# Patient Record
Sex: Male | Born: 1935 | Race: White | Hispanic: No | Smoking: Never smoker
Health system: Southern US, Community
[De-identification: ages and names within clinical notes are randomized; demographics above are authoritative.]

## PROBLEM LIST (undated history)

## (undated) DIAGNOSIS — N189 Chronic kidney disease, unspecified: Secondary | ICD-10-CM

## (undated) DIAGNOSIS — F0280 Dementia in other diseases classified elsewhere without behavioral disturbance: Secondary | ICD-10-CM

## (undated) DIAGNOSIS — K819 Cholecystitis, unspecified: Secondary | ICD-10-CM

## (undated) DIAGNOSIS — D500 Iron deficiency anemia secondary to blood loss (chronic): Secondary | ICD-10-CM

## (undated) DIAGNOSIS — G309 Alzheimer's disease, unspecified: Secondary | ICD-10-CM

## (undated) DIAGNOSIS — R0600 Dyspnea, unspecified: Secondary | ICD-10-CM

## (undated) DIAGNOSIS — T148XXA Other injury of unspecified body region, initial encounter: Secondary | ICD-10-CM

## (undated) DIAGNOSIS — R0609 Other forms of dyspnea: Secondary | ICD-10-CM

## (undated) DIAGNOSIS — Z9289 Personal history of other medical treatment: Secondary | ICD-10-CM

## (undated) DIAGNOSIS — I4891 Unspecified atrial fibrillation: Secondary | ICD-10-CM

## (undated) DIAGNOSIS — F0390 Unspecified dementia without behavioral disturbance: Secondary | ICD-10-CM

## (undated) DIAGNOSIS — E039 Hypothyroidism, unspecified: Secondary | ICD-10-CM

## (undated) HISTORY — PX: TONSILLECTOMY: SUR1361

## (undated) HISTORY — PX: ABDOMINAL SURGERY: SHX537

## (undated) HISTORY — PX: PARTIAL GASTRECTOMY: SHX2172

---

## 2008-05-09 ENCOUNTER — Emergency Department (HOSPITAL_COMMUNITY): Admission: EM | Admit: 2008-05-09 | Discharge: 2008-05-09 | Payer: Self-pay | Admitting: Emergency Medicine

## 2008-12-06 ENCOUNTER — Encounter: Admission: RE | Admit: 2008-12-06 | Discharge: 2008-12-06 | Payer: Self-pay | Admitting: Internal Medicine

## 2010-06-14 LAB — DIFFERENTIAL
Basophils Absolute: 0 10*3/uL (ref 0.0–0.1)
Basophils Relative: 0 % (ref 0–1)
Eosinophils Absolute: 0.1 10*3/uL (ref 0.0–0.7)
Monocytes Absolute: 0.5 10*3/uL (ref 0.1–1.0)

## 2010-06-14 LAB — CBC
HCT: 40.4 % (ref 39.0–52.0)
MCHC: 34.2 g/dL (ref 30.0–36.0)
RBC: 4.48 MIL/uL (ref 4.22–5.81)
RDW: 14.4 % (ref 11.5–15.5)

## 2010-06-14 LAB — BASIC METABOLIC PANEL
BUN: 19 mg/dL (ref 6–23)
CO2: 26 mEq/L (ref 19–32)
Chloride: 106 mEq/L (ref 96–112)
Glucose, Bld: 129 mg/dL — ABNORMAL HIGH (ref 70–99)
Sodium: 139 mEq/L (ref 135–145)

## 2010-06-14 LAB — POCT CARDIAC MARKERS: Troponin i, poc: <0.05 ng/mL (ref 0.00–0.09)

## 2011-03-12 DIAGNOSIS — H903 Sensorineural hearing loss, bilateral: Secondary | ICD-10-CM | POA: Diagnosis not present

## 2011-03-21 DIAGNOSIS — Z7901 Long term (current) use of anticoagulants: Secondary | ICD-10-CM | POA: Diagnosis not present

## 2011-03-21 DIAGNOSIS — I4891 Unspecified atrial fibrillation: Secondary | ICD-10-CM | POA: Diagnosis not present

## 2011-05-08 DIAGNOSIS — I4891 Unspecified atrial fibrillation: Secondary | ICD-10-CM | POA: Diagnosis not present

## 2011-05-08 DIAGNOSIS — Z7901 Long term (current) use of anticoagulants: Secondary | ICD-10-CM | POA: Diagnosis not present

## 2011-06-20 DIAGNOSIS — Z7901 Long term (current) use of anticoagulants: Secondary | ICD-10-CM | POA: Diagnosis not present

## 2011-06-20 DIAGNOSIS — I4891 Unspecified atrial fibrillation: Secondary | ICD-10-CM | POA: Diagnosis not present

## 2011-06-21 DIAGNOSIS — C44320 Squamous cell carcinoma of skin of unspecified parts of face: Secondary | ICD-10-CM | POA: Diagnosis not present

## 2011-07-01 DIAGNOSIS — C44320 Squamous cell carcinoma of skin of unspecified parts of face: Secondary | ICD-10-CM | POA: Diagnosis not present

## 2011-07-15 DIAGNOSIS — N400 Enlarged prostate without lower urinary tract symptoms: Secondary | ICD-10-CM | POA: Diagnosis not present

## 2011-07-15 DIAGNOSIS — N529 Male erectile dysfunction, unspecified: Secondary | ICD-10-CM | POA: Diagnosis not present

## 2011-07-15 DIAGNOSIS — N419 Inflammatory disease of prostate, unspecified: Secondary | ICD-10-CM | POA: Diagnosis not present

## 2011-08-08 DIAGNOSIS — I4891 Unspecified atrial fibrillation: Secondary | ICD-10-CM | POA: Diagnosis not present

## 2011-08-08 DIAGNOSIS — Z7901 Long term (current) use of anticoagulants: Secondary | ICD-10-CM | POA: Diagnosis not present

## 2011-10-03 DIAGNOSIS — Z7901 Long term (current) use of anticoagulants: Secondary | ICD-10-CM | POA: Diagnosis not present

## 2011-12-02 DIAGNOSIS — I4891 Unspecified atrial fibrillation: Secondary | ICD-10-CM | POA: Diagnosis not present

## 2011-12-02 DIAGNOSIS — Z79899 Other long term (current) drug therapy: Secondary | ICD-10-CM | POA: Diagnosis not present

## 2011-12-02 DIAGNOSIS — Z125 Encounter for screening for malignant neoplasm of prostate: Secondary | ICD-10-CM | POA: Diagnosis not present

## 2011-12-09 DIAGNOSIS — M1990 Unspecified osteoarthritis, unspecified site: Secondary | ICD-10-CM | POA: Diagnosis not present

## 2011-12-09 DIAGNOSIS — Z79899 Other long term (current) drug therapy: Secondary | ICD-10-CM | POA: Diagnosis not present

## 2011-12-09 DIAGNOSIS — Z23 Encounter for immunization: Secondary | ICD-10-CM | POA: Diagnosis not present

## 2011-12-09 DIAGNOSIS — Z0000 Encounter for general adult medical examination without abnormal findings: Secondary | ICD-10-CM | POA: Diagnosis not present

## 2011-12-09 DIAGNOSIS — Z1331 Encounter for screening for depression: Secondary | ICD-10-CM | POA: Diagnosis not present

## 2011-12-09 DIAGNOSIS — I4891 Unspecified atrial fibrillation: Secondary | ICD-10-CM | POA: Diagnosis not present

## 2011-12-10 DIAGNOSIS — Z1212 Encounter for screening for malignant neoplasm of rectum: Secondary | ICD-10-CM | POA: Diagnosis not present

## 2012-01-07 DIAGNOSIS — L738 Other specified follicular disorders: Secondary | ICD-10-CM | POA: Diagnosis not present

## 2012-01-07 DIAGNOSIS — Z85828 Personal history of other malignant neoplasm of skin: Secondary | ICD-10-CM | POA: Diagnosis not present

## 2012-01-07 DIAGNOSIS — L570 Actinic keratosis: Secondary | ICD-10-CM | POA: Diagnosis not present

## 2012-01-07 DIAGNOSIS — Z7901 Long term (current) use of anticoagulants: Secondary | ICD-10-CM | POA: Diagnosis not present

## 2012-01-07 DIAGNOSIS — L821 Other seborrheic keratosis: Secondary | ICD-10-CM | POA: Diagnosis not present

## 2012-01-27 DIAGNOSIS — R351 Nocturia: Secondary | ICD-10-CM | POA: Diagnosis not present

## 2012-01-27 DIAGNOSIS — N400 Enlarged prostate without lower urinary tract symptoms: Secondary | ICD-10-CM | POA: Diagnosis not present

## 2012-01-27 DIAGNOSIS — N529 Male erectile dysfunction, unspecified: Secondary | ICD-10-CM | POA: Diagnosis not present

## 2012-02-11 DIAGNOSIS — I4891 Unspecified atrial fibrillation: Secondary | ICD-10-CM | POA: Diagnosis not present

## 2012-02-11 DIAGNOSIS — Z7901 Long term (current) use of anticoagulants: Secondary | ICD-10-CM | POA: Diagnosis not present

## 2012-02-20 DIAGNOSIS — L570 Actinic keratosis: Secondary | ICD-10-CM | POA: Diagnosis not present

## 2012-02-20 DIAGNOSIS — L821 Other seborrheic keratosis: Secondary | ICD-10-CM | POA: Diagnosis not present

## 2012-02-20 DIAGNOSIS — Z85828 Personal history of other malignant neoplasm of skin: Secondary | ICD-10-CM | POA: Diagnosis not present

## 2012-03-24 DIAGNOSIS — I4891 Unspecified atrial fibrillation: Secondary | ICD-10-CM | POA: Diagnosis not present

## 2012-03-24 DIAGNOSIS — Z7901 Long term (current) use of anticoagulants: Secondary | ICD-10-CM | POA: Diagnosis not present

## 2012-05-03 DIAGNOSIS — R404 Transient alteration of awareness: Secondary | ICD-10-CM | POA: Diagnosis not present

## 2012-05-03 DIAGNOSIS — R42 Dizziness and giddiness: Secondary | ICD-10-CM | POA: Diagnosis not present

## 2012-05-05 DIAGNOSIS — Z7901 Long term (current) use of anticoagulants: Secondary | ICD-10-CM | POA: Diagnosis not present

## 2012-05-05 DIAGNOSIS — I4891 Unspecified atrial fibrillation: Secondary | ICD-10-CM | POA: Diagnosis not present

## 2012-06-08 DIAGNOSIS — I4891 Unspecified atrial fibrillation: Secondary | ICD-10-CM | POA: Diagnosis not present

## 2012-06-08 DIAGNOSIS — M1990 Unspecified osteoarthritis, unspecified site: Secondary | ICD-10-CM | POA: Diagnosis not present

## 2012-06-08 DIAGNOSIS — Z7901 Long term (current) use of anticoagulants: Secondary | ICD-10-CM | POA: Diagnosis not present

## 2012-07-08 DIAGNOSIS — Z7901 Long term (current) use of anticoagulants: Secondary | ICD-10-CM | POA: Diagnosis not present

## 2012-07-08 DIAGNOSIS — Z79899 Other long term (current) drug therapy: Secondary | ICD-10-CM | POA: Diagnosis not present

## 2012-07-08 DIAGNOSIS — I4891 Unspecified atrial fibrillation: Secondary | ICD-10-CM | POA: Diagnosis not present

## 2012-07-23 DIAGNOSIS — L570 Actinic keratosis: Secondary | ICD-10-CM | POA: Diagnosis not present

## 2012-07-23 DIAGNOSIS — Z85828 Personal history of other malignant neoplasm of skin: Secondary | ICD-10-CM | POA: Diagnosis not present

## 2012-07-23 DIAGNOSIS — C44320 Squamous cell carcinoma of skin of unspecified parts of face: Secondary | ICD-10-CM | POA: Diagnosis not present

## 2012-07-23 DIAGNOSIS — L821 Other seborrheic keratosis: Secondary | ICD-10-CM | POA: Diagnosis not present

## 2012-09-28 DIAGNOSIS — L570 Actinic keratosis: Secondary | ICD-10-CM | POA: Diagnosis not present

## 2012-09-28 DIAGNOSIS — L821 Other seborrheic keratosis: Secondary | ICD-10-CM | POA: Diagnosis not present

## 2012-09-28 DIAGNOSIS — Z85828 Personal history of other malignant neoplasm of skin: Secondary | ICD-10-CM | POA: Diagnosis not present

## 2012-09-28 DIAGNOSIS — L738 Other specified follicular disorders: Secondary | ICD-10-CM | POA: Diagnosis not present

## 2012-10-29 DIAGNOSIS — Z7901 Long term (current) use of anticoagulants: Secondary | ICD-10-CM | POA: Diagnosis not present

## 2012-10-29 DIAGNOSIS — I4891 Unspecified atrial fibrillation: Secondary | ICD-10-CM | POA: Diagnosis not present

## 2012-11-11 DIAGNOSIS — Z23 Encounter for immunization: Secondary | ICD-10-CM | POA: Diagnosis not present

## 2012-12-07 DIAGNOSIS — Z125 Encounter for screening for malignant neoplasm of prostate: Secondary | ICD-10-CM | POA: Diagnosis not present

## 2012-12-07 DIAGNOSIS — I4891 Unspecified atrial fibrillation: Secondary | ICD-10-CM | POA: Diagnosis not present

## 2012-12-07 DIAGNOSIS — Z79899 Other long term (current) drug therapy: Secondary | ICD-10-CM | POA: Diagnosis not present

## 2012-12-09 DIAGNOSIS — Z7901 Long term (current) use of anticoagulants: Secondary | ICD-10-CM | POA: Diagnosis not present

## 2012-12-09 DIAGNOSIS — I4891 Unspecified atrial fibrillation: Secondary | ICD-10-CM | POA: Diagnosis not present

## 2012-12-16 DIAGNOSIS — IMO0002 Reserved for concepts with insufficient information to code with codable children: Secondary | ICD-10-CM | POA: Diagnosis not present

## 2012-12-16 DIAGNOSIS — M1990 Unspecified osteoarthritis, unspecified site: Secondary | ICD-10-CM | POA: Diagnosis not present

## 2012-12-16 DIAGNOSIS — Z7901 Long term (current) use of anticoagulants: Secondary | ICD-10-CM | POA: Diagnosis not present

## 2012-12-16 DIAGNOSIS — Z0000 Encounter for general adult medical examination without abnormal findings: Secondary | ICD-10-CM | POA: Diagnosis not present

## 2012-12-16 DIAGNOSIS — I4891 Unspecified atrial fibrillation: Secondary | ICD-10-CM | POA: Diagnosis not present

## 2012-12-16 DIAGNOSIS — Z1331 Encounter for screening for depression: Secondary | ICD-10-CM | POA: Diagnosis not present

## 2012-12-16 DIAGNOSIS — Z125 Encounter for screening for malignant neoplasm of prostate: Secondary | ICD-10-CM | POA: Diagnosis not present

## 2012-12-21 DIAGNOSIS — Z1212 Encounter for screening for malignant neoplasm of rectum: Secondary | ICD-10-CM | POA: Diagnosis not present

## 2012-12-23 DIAGNOSIS — M79609 Pain in unspecified limb: Secondary | ICD-10-CM | POA: Diagnosis not present

## 2012-12-23 DIAGNOSIS — B351 Tinea unguium: Secondary | ICD-10-CM | POA: Diagnosis not present

## 2012-12-23 DIAGNOSIS — I739 Peripheral vascular disease, unspecified: Secondary | ICD-10-CM | POA: Diagnosis not present

## 2012-12-23 DIAGNOSIS — L608 Other nail disorders: Secondary | ICD-10-CM | POA: Diagnosis not present

## 2013-01-12 DIAGNOSIS — R195 Other fecal abnormalities: Secondary | ICD-10-CM | POA: Diagnosis not present

## 2013-01-20 DIAGNOSIS — I4891 Unspecified atrial fibrillation: Secondary | ICD-10-CM | POA: Diagnosis not present

## 2013-01-20 DIAGNOSIS — Z7901 Long term (current) use of anticoagulants: Secondary | ICD-10-CM | POA: Diagnosis not present

## 2013-01-25 DIAGNOSIS — R351 Nocturia: Secondary | ICD-10-CM | POA: Diagnosis not present

## 2013-01-25 DIAGNOSIS — N400 Enlarged prostate without lower urinary tract symptoms: Secondary | ICD-10-CM | POA: Diagnosis not present

## 2013-01-25 DIAGNOSIS — N529 Male erectile dysfunction, unspecified: Secondary | ICD-10-CM | POA: Diagnosis not present

## 2013-01-25 DIAGNOSIS — R195 Other fecal abnormalities: Secondary | ICD-10-CM | POA: Diagnosis not present

## 2013-02-22 DIAGNOSIS — L608 Other nail disorders: Secondary | ICD-10-CM | POA: Diagnosis not present

## 2013-02-22 DIAGNOSIS — M79609 Pain in unspecified limb: Secondary | ICD-10-CM | POA: Diagnosis not present

## 2013-02-22 DIAGNOSIS — B351 Tinea unguium: Secondary | ICD-10-CM | POA: Diagnosis not present

## 2013-02-22 DIAGNOSIS — I739 Peripheral vascular disease, unspecified: Secondary | ICD-10-CM | POA: Diagnosis not present

## 2013-03-09 DIAGNOSIS — I4891 Unspecified atrial fibrillation: Secondary | ICD-10-CM | POA: Diagnosis not present

## 2013-03-09 DIAGNOSIS — Z7901 Long term (current) use of anticoagulants: Secondary | ICD-10-CM | POA: Diagnosis not present

## 2013-03-31 DIAGNOSIS — L570 Actinic keratosis: Secondary | ICD-10-CM | POA: Diagnosis not present

## 2013-03-31 DIAGNOSIS — Z85828 Personal history of other malignant neoplasm of skin: Secondary | ICD-10-CM | POA: Diagnosis not present

## 2013-03-31 DIAGNOSIS — L821 Other seborrheic keratosis: Secondary | ICD-10-CM | POA: Diagnosis not present

## 2013-04-24 ENCOUNTER — Encounter (HOSPITAL_COMMUNITY): Payer: Self-pay | Admitting: Emergency Medicine

## 2013-04-24 ENCOUNTER — Emergency Department (HOSPITAL_COMMUNITY): Payer: Medicare Other

## 2013-04-24 ENCOUNTER — Inpatient Hospital Stay (HOSPITAL_COMMUNITY)
Admission: EM | Admit: 2013-04-24 | Discharge: 2013-05-02 | DRG: 417 | Disposition: A | Discharge status: Skilled Nursing Facility | Payer: Medicare Other | Attending: Internal Medicine | Admitting: Internal Medicine

## 2013-04-24 DIAGNOSIS — G929 Unspecified toxic encephalopathy: Secondary | ICD-10-CM | POA: Diagnosis not present

## 2013-04-24 DIAGNOSIS — R0609 Other forms of dyspnea: Secondary | ICD-10-CM | POA: Diagnosis not present

## 2013-04-24 DIAGNOSIS — I4891 Unspecified atrial fibrillation: Secondary | ICD-10-CM | POA: Diagnosis present

## 2013-04-24 DIAGNOSIS — G92 Toxic encephalopathy: Secondary | ICD-10-CM | POA: Diagnosis not present

## 2013-04-24 DIAGNOSIS — R5381 Other malaise: Secondary | ICD-10-CM | POA: Diagnosis present

## 2013-04-24 DIAGNOSIS — R404 Transient alteration of awareness: Secondary | ICD-10-CM | POA: Diagnosis present

## 2013-04-24 DIAGNOSIS — F17200 Nicotine dependence, unspecified, uncomplicated: Secondary | ICD-10-CM | POA: Diagnosis present

## 2013-04-24 DIAGNOSIS — R110 Nausea: Secondary | ICD-10-CM | POA: Diagnosis not present

## 2013-04-24 DIAGNOSIS — N189 Chronic kidney disease, unspecified: Secondary | ICD-10-CM | POA: Diagnosis not present

## 2013-04-24 DIAGNOSIS — N281 Cyst of kidney, acquired: Secondary | ICD-10-CM | POA: Diagnosis not present

## 2013-04-24 DIAGNOSIS — M6281 Muscle weakness (generalized): Secondary | ICD-10-CM | POA: Diagnosis not present

## 2013-04-24 DIAGNOSIS — Z91040 Latex allergy status: Secondary | ICD-10-CM | POA: Diagnosis not present

## 2013-04-24 DIAGNOSIS — K810 Acute cholecystitis: Secondary | ICD-10-CM | POA: Diagnosis not present

## 2013-04-24 DIAGNOSIS — I4729 Other ventricular tachycardia: Secondary | ICD-10-CM | POA: Diagnosis present

## 2013-04-24 DIAGNOSIS — K819 Cholecystitis, unspecified: Secondary | ICD-10-CM | POA: Diagnosis present

## 2013-04-24 DIAGNOSIS — Z881 Allergy status to other antibiotic agents status: Secondary | ICD-10-CM | POA: Diagnosis not present

## 2013-04-24 DIAGNOSIS — K828 Other specified diseases of gallbladder: Secondary | ICD-10-CM | POA: Diagnosis not present

## 2013-04-24 DIAGNOSIS — G309 Alzheimer's disease, unspecified: Secondary | ICD-10-CM | POA: Diagnosis present

## 2013-04-24 DIAGNOSIS — R1013 Epigastric pain: Secondary | ICD-10-CM

## 2013-04-24 DIAGNOSIS — I472 Ventricular tachycardia, unspecified: Secondary | ICD-10-CM | POA: Diagnosis present

## 2013-04-24 DIAGNOSIS — F0280 Dementia in other diseases classified elsewhere without behavioral disturbance: Secondary | ICD-10-CM | POA: Diagnosis present

## 2013-04-24 DIAGNOSIS — Z7901 Long term (current) use of anticoagulants: Secondary | ICD-10-CM

## 2013-04-24 DIAGNOSIS — K8020 Calculus of gallbladder without cholecystitis without obstruction: Secondary | ICD-10-CM | POA: Diagnosis not present

## 2013-04-24 DIAGNOSIS — N183 Chronic kidney disease, stage 3 unspecified: Secondary | ICD-10-CM | POA: Diagnosis not present

## 2013-04-24 DIAGNOSIS — K7689 Other specified diseases of liver: Secondary | ICD-10-CM | POA: Diagnosis not present

## 2013-04-24 DIAGNOSIS — K660 Peritoneal adhesions (postprocedural) (postinfection): Secondary | ICD-10-CM | POA: Diagnosis present

## 2013-04-24 DIAGNOSIS — K8000 Calculus of gallbladder with acute cholecystitis without obstruction: Principal | ICD-10-CM | POA: Diagnosis present

## 2013-04-24 DIAGNOSIS — I359 Nonrheumatic aortic valve disorder, unspecified: Secondary | ICD-10-CM | POA: Diagnosis present

## 2013-04-24 DIAGNOSIS — N400 Enlarged prostate without lower urinary tract symptoms: Secondary | ICD-10-CM | POA: Diagnosis not present

## 2013-04-24 DIAGNOSIS — R0789 Other chest pain: Secondary | ICD-10-CM | POA: Diagnosis not present

## 2013-04-24 DIAGNOSIS — N1831 Chronic kidney disease, stage 3a: Secondary | ICD-10-CM | POA: Diagnosis present

## 2013-04-24 DIAGNOSIS — R262 Difficulty in walking, not elsewhere classified: Secondary | ICD-10-CM | POA: Diagnosis not present

## 2013-04-24 DIAGNOSIS — Z4889 Encounter for other specified surgical aftercare: Secondary | ICD-10-CM | POA: Diagnosis not present

## 2013-04-24 DIAGNOSIS — R0989 Other specified symptoms and signs involving the circulatory and respiratory systems: Secondary | ICD-10-CM | POA: Diagnosis not present

## 2013-04-24 DIAGNOSIS — R112 Nausea with vomiting, unspecified: Secondary | ICD-10-CM | POA: Diagnosis not present

## 2013-04-24 HISTORY — DX: Unspecified dementia, unspecified severity, without behavioral disturbance, psychotic disturbance, mood disturbance, and anxiety: F03.90

## 2013-04-24 HISTORY — DX: Chronic kidney disease, unspecified: N18.9

## 2013-04-24 HISTORY — DX: Unspecified atrial fibrillation: I48.91

## 2013-04-24 LAB — COMPREHENSIVE METABOLIC PANEL
ALT: 12 U/L (ref 0–53)
AST: 22 U/L (ref 0–37)
Albumin: 3.9 g/dL (ref 3.5–5.2)
Alkaline Phosphatase: 79 U/L (ref 39–117)
BUN: 21 mg/dL (ref 6–23)
CHLORIDE: 100 meq/L (ref 96–112)
CO2: 23 mEq/L (ref 19–32)
CREATININE: 1.57 mg/dL — AB (ref 0.50–1.35)
Calcium: 9.7 mg/dL (ref 8.4–10.5)
GFR calc Af Amer: 44 mL/min — ABNORMAL LOW (ref >90)
GFR, EST NON AFRICAN AMERICAN: 38 mL/min — AB (ref >90)
Glucose, Bld: 174 mg/dL — ABNORMAL HIGH (ref 70–99)
Potassium: 4.3 mEq/L (ref 3.7–5.3)
Sodium: 138 mEq/L (ref 137–147)
Total Bilirubin: 0.8 mg/dL (ref 0.3–1.2)
Total Protein: 7.8 g/dL (ref 6.0–8.3)

## 2013-04-24 LAB — URINALYSIS, ROUTINE W REFLEX MICROSCOPIC
Glucose, UA: NEGATIVE mg/dL
HGB URINE DIPSTICK: NEGATIVE
KETONES UR: 15 mg/dL — AB
Leukocytes, UA: NEGATIVE
Nitrite: NEGATIVE
PROTEIN: 30 mg/dL — AB
SPECIFIC GRAVITY, URINE: 1.029 (ref 1.005–1.030)
UROBILINOGEN UA: 1 mg/dL (ref 0.0–1.0)
pH: 5 (ref 5.0–8.0)

## 2013-04-24 LAB — I-STAT TROPONIN, ED: TROPONIN I, POC: 0.02 ng/mL (ref 0.00–0.08)

## 2013-04-24 LAB — CBC WITH DIFFERENTIAL/PLATELET
Basophils Absolute: 0 10*3/uL (ref 0.0–0.1)
Basophils Relative: 0 % (ref 0–1)
Eosinophils Absolute: 0 10*3/uL (ref 0.0–0.7)
Eosinophils Relative: 0 % (ref 0–5)
HCT: 47.2 % (ref 39.0–52.0)
Hemoglobin: 16.1 g/dL (ref 13.0–17.0)
Lymphocytes Relative: 6 % — ABNORMAL LOW (ref 12–46)
Lymphs Abs: 0.8 10*3/uL (ref 0.7–4.0)
MCH: 28.3 pg (ref 26.0–34.0)
MCHC: 34.1 g/dL (ref 30.0–36.0)
MCV: 83.1 fL (ref 78.0–100.0)
Monocytes Absolute: 1 10*3/uL (ref 0.1–1.0)
Monocytes Relative: 7 % (ref 3–12)
Neutro Abs: 12.3 10*3/uL — ABNORMAL HIGH (ref 1.7–7.7)
Neutrophils Relative %: 87 % — ABNORMAL HIGH (ref 43–77)
Platelets: 240 10*3/uL (ref 150–400)
RBC: 5.68 MIL/uL (ref 4.22–5.81)
RDW: 14.3 % (ref 11.5–15.5)
WBC: 14.2 10*3/uL — ABNORMAL HIGH (ref 4.0–10.5)

## 2013-04-24 LAB — URINE MICROSCOPIC-ADD ON

## 2013-04-24 LAB — PROTIME-INR
INR: 1.84 — ABNORMAL HIGH (ref 0.00–1.49)
Prothrombin Time: 20.7 seconds — ABNORMAL HIGH (ref 11.6–15.2)

## 2013-04-24 LAB — TROPONIN I: Troponin I: <0.3 ng/mL (ref <0.30)

## 2013-04-24 LAB — LIPASE, BLOOD: Lipase: 35 U/L (ref 11–59)

## 2013-04-24 MED ORDER — MORPHINE SULFATE 2 MG/ML IJ SOLN
2.0000 mg | Freq: Once | INTRAMUSCULAR | Status: AC
  Administered 2013-04-24: 2 mg via INTRAVENOUS
  Filled 2013-04-24: qty 1

## 2013-04-24 MED ORDER — SODIUM CHLORIDE 0.9 % IV BOLUS (SEPSIS)
1000.0000 mL | Freq: Once | INTRAVENOUS | Status: AC
  Administered 2013-04-24: 1000 mL via INTRAVENOUS

## 2013-04-24 MED ORDER — ONDANSETRON HCL 4 MG/2ML IJ SOLN: 4.0000 mg | Freq: Four times a day (QID) | INTRAMUSCULAR | Status: DC | PRN

## 2013-04-24 MED ORDER — ACETAMINOPHEN 650 MG RE SUPP: 650.0000 mg | Freq: Four times a day (QID) | RECTAL | Status: DC | PRN

## 2013-04-24 MED ORDER — ONDANSETRON HCL 4 MG PO TABS: 4.0000 mg | ORAL_TABLET | Freq: Four times a day (QID) | ORAL | Status: DC | PRN

## 2013-04-24 MED ORDER — SODIUM CHLORIDE 0.9 % IJ SOLN
3.0000 mL | Freq: Two times a day (BID) | INTRAMUSCULAR | Status: DC
  Administered 2013-04-25 – 2013-05-02 (×9): 3 mL via INTRAVENOUS

## 2013-04-24 MED ORDER — PANTOPRAZOLE SODIUM 40 MG IV SOLR
40.0000 mg | Freq: Once | INTRAVENOUS | Status: AC
  Administered 2013-04-24: 40 mg via INTRAVENOUS
  Filled 2013-04-24: qty 40

## 2013-04-24 MED ORDER — SODIUM CHLORIDE 0.9 % IV SOLN
3.0000 g | Freq: Three times a day (TID) | INTRAVENOUS | Status: DC
  Administered 2013-04-25: 3 g via INTRAVENOUS
  Filled 2013-04-24 (×3): qty 3

## 2013-04-24 MED ORDER — HYDROCODONE-ACETAMINOPHEN 5-325 MG PO TABS
1.0000 | ORAL_TABLET | ORAL | Status: DC | PRN
  Administered 2013-04-26: 1 via ORAL
  Filled 2013-04-24: qty 1

## 2013-04-24 MED ORDER — SODIUM CHLORIDE 0.9 % IV SOLN
3.0000 g | Freq: Once | INTRAVENOUS | Status: AC
  Administered 2013-04-24: 22:00:00 via INTRAVENOUS
  Filled 2013-04-24: qty 3

## 2013-04-24 MED ORDER — METOPROLOL TARTRATE 12.5 MG HALF TABLET
12.5000 mg | ORAL_TABLET | Freq: Two times a day (BID) | ORAL | Status: DC
  Administered 2013-04-25 – 2013-05-02 (×15): 12.5 mg via ORAL
  Filled 2013-04-24 (×19): qty 1

## 2013-04-24 MED ORDER — DIGOXIN 125 MCG PO TABS: 0.1250 mg | ORAL_TABLET | Freq: Every day | ORAL | Status: DC

## 2013-04-24 MED ORDER — CHLORHEXIDINE GLUCONATE 0.12 % MT SOLN
15.0000 mL | Freq: Every day | OROMUCOSAL | Status: DC
  Administered 2013-04-25 – 2013-04-28 (×3): 15 mL via OROMUCOSAL
  Filled 2013-04-24 (×5): qty 15

## 2013-04-24 MED ORDER — IOHEXOL 300 MG/ML  SOLN
25.0000 mL | INTRAMUSCULAR | Status: AC | PRN
  Administered 2013-04-24 (×2): 25 mL via ORAL

## 2013-04-24 MED ORDER — ACETAMINOPHEN 325 MG PO TABS: 650.0000 mg | ORAL_TABLET | Freq: Four times a day (QID) | ORAL | Status: DC | PRN

## 2013-04-24 MED ORDER — POTASSIUM CHLORIDE IN NACL 20-0.9 MEQ/L-% IV SOLN
INTRAVENOUS | Status: DC
  Administered 2013-04-25 – 2013-04-26 (×3): via INTRAVENOUS
  Filled 2013-04-24 (×7): qty 1000

## 2013-04-24 MED ORDER — ALUM & MAG HYDROXIDE-SIMETH 200-200-20 MG/5ML PO SUSP
30.0000 mL | Freq: Four times a day (QID) | ORAL | Status: DC | PRN
  Filled 2013-04-24: qty 30

## 2013-04-24 MED ORDER — ALUM & MAG HYDROXIDE-SIMETH 200-200-20 MG/5ML PO SUSP
30.0000 mL | Freq: Once | ORAL | Status: AC
  Administered 2013-04-24: 30 mL via ORAL
  Filled 2013-04-24: qty 30

## 2013-04-24 MED ORDER — BISACODYL 5 MG PO TBEC: 5.0000 mg | DELAYED_RELEASE_TABLET | Freq: Every day | ORAL | Status: DC | PRN

## 2013-04-24 MED ORDER — ENOXAPARIN SODIUM 40 MG/0.4ML ~~LOC~~ SOLN
40.0000 mg | SUBCUTANEOUS | Status: DC
  Administered 2013-04-25: 40 mg via SUBCUTANEOUS
  Filled 2013-04-24: qty 0.4

## 2013-04-24 MED ORDER — FLEET ENEMA 7-19 GM/118ML RE ENEM: 1.0000 | ENEMA | Freq: Once | RECTAL | Status: AC | PRN

## 2013-04-24 NOTE — ED Notes (Signed)
Waiting on ultrasound. Explained delay to family. They verbalize understanding.

## 2013-04-24 NOTE — ED Notes (Signed)
Pt states middle abdominal pain ongoing for several days. States pain became worse today. 5/10 pain at the time. Denies nausea/vomiting. Last BM yesterday was normal. Pt is alert and oriented x4.

## 2013-04-24 NOTE — Progress Notes (Signed)
ANTIBIOTIC CONSULT NOTE - INITIAL  Pharmacy Consult for Unasyn  Indication: Intra-abdominal infection   Allergies  Allergen Reactions  . Cephalosporins     Unknown, previously documented in [place], pt does not recall reactions  . Latex     Vital Signs: Temp: 99.1 F (37.3 C) (02/21 1950) Temp src: Oral (02/21 1950) BP: 122/79 mmHg (02/21 2227) Pulse Rate: 76 (02/21 2227)  Labs:  Recent Labs  04/24/13 1254  WBC 14.2*  HGB 16.1  PLT 240  CREATININE 1.57*    Medical History: Past Medical History  Diagnosis Date  . Atrial fibrillation   . Dementia   . Chronic kidney disease    Assessment: 79 y/o M for acute on chronic cholecystitis, Unasyn 3g IV x 1 already given in the ED, surgery to re-assess in the AM, WBC 14.2, noted SCr 1.57, other labs as above.   Goal of Therapy:  Clinical resolution   Plan:  -Unasyn 3g IV q8h -Trend WBC, temp, renal function (may need dose decrease if declines) -F/U surgery plans  Narda Bonds 04/24/2013,11:08 PM

## 2013-04-24 NOTE — ED Provider Notes (Signed)
This 7:20 PM patient resting comfortably, asymptomatic. At 9:30 PM patient is resting comfortably. Abdominal pain is minimal. He is minimally tender in his epigastrium. Dr. Brantley Stage consulted for general surgery. Request Unasyn 3 g IV. And medical admission. I spoke with Dr. Sharlett Iles who will arrange for admission Results for orders placed during the hospital encounter of 04/24/13  [facility]      Result Value Ref Range   Prothrombin Time 20.7 (*) 11.6 - 15.2 seconds   INR 1.84 (*) 0.00 - 1.49  COMPREHENSIVE METABOLIC PANEL      Result Value Ref Range   Sodium 138  137 - 147 mEq/L   Potassium 4.3  3.7 - 5.3 mEq/L   Chloride 100  96 - 112 mEq/L   CO2 23  19 - 32 mEq/L   Glucose, Bld 174 (*) 70 - 99 mg/dL   BUN 21  6 - 23 mg/dL   Creatinine, Ser 1.57 (*) 0.50 - 1.35 mg/dL   Calcium 9.7  8.4 - 10.5 mg/dL   Total Protein 7.8  6.0 - 8.3 g/dL   Albumin 3.9  3.5 - 5.2 g/dL   AST 22  0 - 37 U/L   ALT 12  0 - 53 U/L   Alkaline Phosphatase 79  39 - 117 U/L   Total Bilirubin 0.8  0.3 - 1.2 mg/dL   GFR calc non Af Amer 38 (*) >90 mL/min   GFR calc Af Amer 44 (*) >90 mL/min  CBC WITH DIFFERENTIAL      Result Value Ref Range   WBC 14.2 (*) 4.0 - 10.5 K/uL   RBC 5.68  4.22 - 5.81 MIL/uL   Hemoglobin 16.1  13.0 - 17.0 g/dL   HCT 47.2  39.0 - 52.0 %   MCV 83.1  78.0 - 100.0 fL   MCH 28.3  26.0 - 34.0 pg   MCHC 34.1  30.0 - 36.0 g/dL   RDW 14.3  11.5 - 15.5 %   Platelets 240  150 - 400 K/uL   Neutrophils Relative % 87 (*) 43 - 77 %   Neutro Abs 12.3 (*) 1.7 - 7.7 K/uL   Lymphocytes Relative 6 (*) 12 - 46 %   Lymphs Abs 0.8  0.7 - 4.0 K/uL   Monocytes Relative 7  3 - 12 %   Monocytes Absolute 1.0  0.1 - 1.0 K/uL   Eosinophils Relative 0  0 - 5 %   Eosinophils Absolute 0.0  0.0 - 0.7 K/uL   Basophils Relative 0  0 - 1 %   Basophils Absolute 0.0  0.0 - 0.1 K/uL  LIPASE, BLOOD      Result Value Ref Range   Lipase 35  11 - 59 U/L  TROPONIN I      Result Value Ref Range   Troponin I <0.30   <0.30 ng/mL  URINALYSIS, ROUTINE W REFLEX MICROSCOPIC      Result Value Ref Range   Color, Urine YELLOW  YELLOW   APPearance CLOUDY (*) CLEAR   Specific Gravity, Urine 1.029  1.005 - 1.030   pH 5.0  5.0 - 8.0   Glucose, UA NEGATIVE  NEGATIVE mg/dL   Hgb urine dipstick NEGATIVE  NEGATIVE   Bilirubin Urine SMALL (*) NEGATIVE   Ketones, ur 15 (*) NEGATIVE mg/dL   Protein, ur 30 (*) NEGATIVE mg/dL   Urobilinogen, UA 1.0  0.0 - 1.0 mg/dL   Nitrite NEGATIVE  NEGATIVE   Leukocytes, UA NEGATIVE  NEGATIVE  URINE  MICROSCOPIC-ADD ON      Result Value Ref Range   Squamous Epithelial / LPF RARE  RARE   WBC, UA 0-2  <3 WBC/hpf   Casts GRANULAR CAST (*) NEGATIVE  I-STAT TROPOININ, ED      Result Value Ref Range   Troponin i, poc 0.02  0.00 - 0.08 ng/mL   Comment 3            Ct Abdomen Pelvis Wo Contrast  04/24/2013   CLINICAL DATA:  Mid epigastric abdominal pain for the past several days, history atrial fibrillation, stomach surgery  EXAM: CT ABDOMEN AND PELVIS WITHOUT CONTRAST  TECHNIQUE: Multidetector CT imaging of the abdomen and pelvis was performed following the standard protocol without intravenous contrast. IV contrast not utilized due to renal insufficiency. Dilute oral contrast administered. Sagittal and coronal MPR images reconstructed from axial data set.  COMPARISON:  None  FINDINGS: Lung bases clear.  Within limits of a nonenhanced exam no focal abnormalities of the liver, spleen, pancreas, or adrenal glands.  Probable small bilateral renal cysts, largest upper pole right kidney 20 x 17 mm image 28.  Significantly distended gallbladder with adjacent infiltration raising question of inflammatory process.  Prior surgery at gastroesophageal junction region.  Stomach and bowel loops otherwise grossly unremarkable.  Significant enlargement of prostate gland 6.0 x 5.4 x 5.9 cm image 77 indenting and elevating bladder base.  Left inguinal hernia containing fat.  Scattered atherosclerotic  calcifications.  No mass, adenopathy, free fluid or free air.  Multilevel degenerative disc disease changes of the thoracolumbar spine greatest at L4-L5 with extensive scattered Schmorl's node formation and osseous demineralization.  IMPRESSION: Distended gallbladder with mild adjacent edema, cannot exclude cholecystitis ; consider followup ultrasound.  Small bilateral renal cysts.  Significant prostatic enlargement.  Small left inguinal hernia containing fat.  Remainder of exam unremarkable.   Electronically Signed   By: Lavonia Dana M.D.   On: 04/24/2013 17:46   US Abdomen Limited  04/24/2013   CLINICAL DATA:  Distended and possibly inflamed gallbladder on CT earlier same date. Mid abdominal pain.  EXAM: US ABDOMEN LIMITED - RIGHT UPPER QUADRANT  COMPARISON:  CT ABD/PELV WO CM dated 04/24/2013  FINDINGS: Gallbladder:  Numerous very small shadowing gallstones, on the order of 5-6 mm in size. Gallbladder wall thickening/edema up to approximately 10 mm. Small amount of pericholecystic fluid. Negative sonographic Murphy sign according to the ultrasound technologist.  Common bile duct:  Diameter: Approximately 5 mm.  Liver:  Diffusely increased and coarsened echotexture without focal hepatic parenchymal abnormality. Patent portal vein with hepatopetal flow.  IMPRESSION: 1. Cholelithiasis. Gallbladder wall thickening up to approximately 10 mm. Small amount of pericholecystic fluid. Findings consistent with cholecystitis. 2. No biliary ductal dilation. 3. Mild diffuse hepatic steatosis without focal hepatic parenchymal abnormality.   Electronically Signed   By: Evangeline Dakin M.D.   On: 04/24/2013 20:32   Dx#1 acute cholecystitis #2 hyperglycemia #3 renal insufficiency  Orlie Dakin, MD 04/24/13 2150

## 2013-04-24 NOTE — Consult Note (Signed)
Reason for Consult:CHOLECYSTITIS Referring Physician: Cathleen Fears MD  Justin Chambers is an 80 y.o. male.  HPI: 3  Day history of vague epigastric discomfort.  Dull in nature.  No radiation.   CT done which showed thick GB and U/S confirmed this. No appetite. Has had partial gastrectomy 40 years ago for PUD according to his son at bedside. Other son has power of attorney. Epigastric pain is better.  No nausea currently.    Past Medical History  Diagnosis Date  . Atrial fibrillation     Past Surgical History  Procedure Laterality Date  . Abdominal surgery      part of stomach removed    No family history on file.  Social History:  reports that he has been smoking.  He does not have any smokeless tobacco history on file. He reports that he does not drink alcohol or use illicit drugs.  Allergies:  Allergies  Allergen Reactions  . Cephalosporins     Unknown, previously documented in [facility], pt does not recall reactions  . Latex     Medications: I have reviewed the patient's current medications.  Results for orders placed during the hospital encounter of 04/24/13 (from the past 48 hour(s))  COMPREHENSIVE METABOLIC PANEL     Status: Abnormal   Collection Time    04/24/13 12:54 PM      Result Value Ref Range   Sodium 138  137 - 147 mEq/L   Potassium 4.3  3.7 - 5.3 mEq/L   Chloride 100  96 - 112 mEq/L   CO2 23  19 - 32 mEq/L   Glucose, Bld 174 (*) 70 - 99 mg/dL   BUN 21  6 - 23 mg/dL   Creatinine, Ser 1.57 (*) 0.50 - 1.35 mg/dL   Calcium 9.7  8.4 - 10.5 mg/dL   Total Protein 7.8  6.0 - 8.3 g/dL   Albumin 3.9  3.5 - 5.2 g/dL   AST 22  0 - 37 U/L   ALT 12  0 - 53 U/L   Alkaline Phosphatase 79  39 - 117 U/L   Total Bilirubin 0.8  0.3 - 1.2 mg/dL   GFR calc non Af Amer 38 (*) >90 mL/min   GFR calc Af Amer 44 (*) >90 mL/min   Comment: (NOTE)     The eGFR has been calculated using the CKD EPI equation.     This calculation has not been validated in all clinical situations.   eGFR's persistently <90 mL/min signify possible Chronic Kidney     Disease.  CBC WITH DIFFERENTIAL     Status: Abnormal   Collection Time    04/24/13 12:54 PM      Result Value Ref Range   WBC 14.2 (*) 4.0 - 10.5 K/uL   RBC 5.68  4.22 - 5.81 MIL/uL   Hemoglobin 16.1  13.0 - 17.0 g/dL   HCT 47.2  39.0 - 52.0 %   MCV 83.1  78.0 - 100.0 fL   MCH 28.3  26.0 - 34.0 pg   MCHC 34.1  30.0 - 36.0 g/dL   RDW 14.3  11.5 - 15.5 %   Platelets 240  150 - 400 K/uL   Neutrophils Relative % 87 (*) 43 - 77 %   Neutro Abs 12.3 (*) 1.7 - 7.7 K/uL   Lymphocytes Relative 6 (*) 12 - 46 %   Lymphs Abs 0.8  0.7 - 4.0 K/uL   Monocytes Relative 7  3 - 12 %  Monocytes Absolute 1.0  0.1 - 1.0 K/uL   Eosinophils Relative 0  0 - 5 %   Eosinophils Absolute 0.0  0.0 - 0.7 K/uL   Basophils Relative 0  0 - 1 %   Basophils Absolute 0.0  0.0 - 0.1 K/uL  LIPASE, BLOOD     Status: None   Collection Time    04/24/13 12:54 PM      Result Value Ref Range   Lipase 35  11 - 59 U/L  PROTIME-INR     Status: Abnormal   Collection Time    04/24/13  1:54 PM      Result Value Ref Range   Prothrombin Time 20.7 (*) 11.6 - 15.2 seconds   INR 1.84 (*) 0.00 - 1.49  TROPONIN I     Status: None   Collection Time    04/24/13  1:54 PM      Result Value Ref Range   Troponin I <0.30  <0.30 ng/mL   Comment:            Due to the release kinetics of cTnI,     a negative result within the first hours     of the onset of symptoms does not rule out     myocardial infarction with certainty.     If myocardial infarction is still suspected,     repeat the test at appropriate intervals.  Randolm Idol, ED     Status: None   Collection Time    04/24/13  2:01 PM      Result Value Ref Range   Troponin i, poc 0.02  0.00 - 0.08 ng/mL   Comment 3            Comment: Due to the release kinetics of cTnI,     a negative result within the first hours     of the onset of symptoms does not rule out     myocardial infarction with  certainty.     If myocardial infarction is still suspected,     repeat the test at appropriate intervals.  URINALYSIS, ROUTINE W REFLEX MICROSCOPIC     Status: Abnormal   Collection Time    04/24/13  2:20 PM      Result Value Ref Range   Color, Urine YELLOW  YELLOW   APPearance CLOUDY (*) CLEAR   Specific Gravity, Urine 1.029  1.005 - 1.030   pH 5.0  5.0 - 8.0   Glucose, UA NEGATIVE  NEGATIVE mg/dL   Hgb urine dipstick NEGATIVE  NEGATIVE   Bilirubin Urine SMALL (*) NEGATIVE   Ketones, ur 15 (*) NEGATIVE mg/dL   Protein, ur 30 (*) NEGATIVE mg/dL   Urobilinogen, UA 1.0  0.0 - 1.0 mg/dL   Nitrite NEGATIVE  NEGATIVE   Leukocytes, UA NEGATIVE  NEGATIVE  URINE MICROSCOPIC-ADD ON     Status: Abnormal   Collection Time    04/24/13  2:20 PM      Result Value Ref Range   Squamous Epithelial / LPF RARE  RARE   WBC, UA 0-2  <3 WBC/hpf   Casts GRANULAR CAST (*) NEGATIVE    Ct Abdomen Pelvis Wo Contrast  04/24/2013   CLINICAL DATA:  Mid epigastric abdominal pain for the past several days, history atrial fibrillation, stomach surgery  EXAM: CT ABDOMEN AND PELVIS WITHOUT CONTRAST  TECHNIQUE: Multidetector CT imaging of the abdomen and pelvis was performed following the standard protocol without intravenous contrast. IV contrast not utilized due to renal  insufficiency. Dilute oral contrast administered. Sagittal and coronal MPR images reconstructed from axial data set.  COMPARISON:  None  FINDINGS: Lung bases clear.  Within limits of a nonenhanced exam no focal abnormalities of the liver, spleen, pancreas, or adrenal glands.  Probable small bilateral renal cysts, largest upper pole right kidney 20 x 17 mm image 28.  Significantly distended gallbladder with adjacent infiltration raising question of inflammatory process.  Prior surgery at gastroesophageal junction region.  Stomach and bowel loops otherwise grossly unremarkable.  Significant enlargement of prostate gland 6.0 x 5.4 x 5.9 cm image 77  indenting and elevating bladder base.  Left inguinal hernia containing fat.  Scattered atherosclerotic calcifications.  No mass, adenopathy, free fluid or free air.  Multilevel degenerative disc disease changes of the thoracolumbar spine greatest at L4-L5 with extensive scattered Schmorl's node formation and osseous demineralization.  IMPRESSION: Distended gallbladder with mild adjacent edema, cannot exclude cholecystitis ; consider followup ultrasound.  Small bilateral renal cysts.  Significant prostatic enlargement.  Small left inguinal hernia containing fat.  Remainder of exam unremarkable.   Electronically Signed   By: Lavonia Dana M.D.   On: 04/24/2013 17:46   US Abdomen Limited  04/24/2013   CLINICAL DATA:  Distended and possibly inflamed gallbladder on CT earlier same date. Mid abdominal pain.  EXAM: US ABDOMEN LIMITED - RIGHT UPPER QUADRANT  COMPARISON:  CT ABD/PELV WO CM dated 04/24/2013  FINDINGS: Gallbladder:  Numerous very small shadowing gallstones, on the order of 5-6 mm in size. Gallbladder wall thickening/edema up to approximately 10 mm. Small amount of pericholecystic fluid. Negative sonographic Murphy sign according to the ultrasound technologist.  Common bile duct:  Diameter: Approximately 5 mm.  Liver:  Diffusely increased and coarsened echotexture without focal hepatic parenchymal abnormality. Patent portal vein with hepatopetal flow.  IMPRESSION: 1. Cholelithiasis. Gallbladder wall thickening up to approximately 10 mm. Small amount of pericholecystic fluid. Findings consistent with cholecystitis. 2. No biliary ductal dilation. 3. Mild diffuse hepatic steatosis without focal hepatic parenchymal abnormality.   Electronically Signed   By: Evangeline Dakin M.D.   On: 04/24/2013 20:32    Review of Systems  HENT: Negative.   Eyes: Negative.   Cardiovascular: Negative for chest pain and palpitations.  Gastrointestinal: Positive for abdominal pain.  Genitourinary: Negative.   Musculoskeletal:  Positive for joint pain.  Skin: Negative.   Neurological: Positive for weakness.  Endo/Heme/Allergies: Negative.   Psychiatric/Behavioral: Positive for memory loss.   Blood pressure 154/65, pulse 76, temperature 99.1 F (37.3 C), temperature source Oral, resp. rate 22, SpO2 97.00%. Physical Exam  Constitutional: He is oriented to person, place, and time. He is cooperative. He has a sickly appearance.  Eyes: No scleral icterus.  Neck: Normal range of motion. Neck supple.  Cardiovascular: A regularly irregular rhythm present.  Murmur heard. Respiratory: Effort normal and breath sounds normal.  GI: He exhibits no distension. There is tenderness in the right upper quadrant. A hernia is present. Hernia confirmed positive in the left inguinal area.    Neurological: He is alert and oriented to person, place, and time.  Skin: Skin is warm and dry.  Psychiatric: He has a normal mood and affect. His behavior is normal.    Assessment/Plan: Acute on chronic cholecystitis Small LIH  A fib on coumadin INR 1.84 ARI Dementia  Asked that medicine admit due to medical issues. He has probably had other attacks or symptoms in the past given GB wall thickness.   Will need clearance.  Given U/S  Finding may be better suited for perc drain vs cholecystectomy at later time. Will start unasyn 3 grams IV q 6 and adjust. .  Will recheck in am.  No surgery tonight.  Clear liquids ok but npo after midnight to reassess in am.   Sylvie Mifsud A. 04/24/2013, 9:50 PM

## 2013-04-24 NOTE — ED Notes (Signed)
Husband stated, she has althziemiers and she's has a stomach pain with diarrhea this morning. A few months ago she had a small stroke.  She's not acting like her stomach hurts now.

## 2013-04-24 NOTE — H&P (Signed)
Justin Chambers is an 80 y.o. male.   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 The pain is described as mild to moderate in intensity, intermittent, slightly worse with eating, and not associated with fever, chills, vomiting, diarrhea, constipation. Has a history of partial gastrectomy about 40 years ago, and no history of bowel obstruction or nephrolithiasis. Workup in the emergency department included labs showing leukocytosis and a CT scan and abdomen ultrasound exam showing cholelithiasis with cholecystitis likely acute on chronic disease. His medical history is otherwise significant for chronic kidney disease, mild Alzheimer's disease, and chronic atrial fibrillation on Coumadin for stroke prevention. He has no known history of heart disease or stroke. In addition, he is very active and has not had symptoms suggestive of angina or poor cardiac function. Past Medical History  Diagnosis Date  . Atrial fibrillation   . Dementia   . Chronic kidney disease      (Not in a hospital admission)  ADDITIONAL HOME MEDICATIONS: No additional medications  PHYSICIANS INVOLVED IN CARE: Burnard Bunting (primary care)  Past Surgical History  Procedure Laterality Date  . Abdominal surgery      part of stomach removed  . Partial gastrectomy Bilateral     History reviewed. No pertinent family history.   Social History:  reports that he has never smoked. He does not have any smokeless tobacco history on file. He reports that he does not drink alcohol or use illicit drugs.  Allergies:  Allergies  Allergen Reactions  . Cephalosporins     Unknown, previously documented in [facility], pt does not recall reactions  . Latex      ROS: heart murmur, kidney disease and atrial fibrillation, coumadin anticoagulation, remote peptic ulcer disease  PHYSICAL EXAM: Blood  pressure 122/79, pulse 76, temperature 99.1 F (37.3 C), temperature source Oral, resp. rate 17, SpO2 98.00%. In general, the patient is an elderly white man who was in no apparent distress while lying at 10 elevation head of bed. HEENT exam was within normal limits, neck was supple without jugular venous distention or carotid bruit, chest was clear to auscultation, heart had an irregularly irregular rhythm with a systolic ejection murmur grade 2/6 at the left sternal border, abdomen had normal bowel sounds and very mild epigastric discomfort without rebound tenderness, extremities were without cyanosis, clubbing, or edema. He was alert and well oriented and was able to give a good history. He was able to move all extremities well.  Results for orders placed during the hospital encounter of 04/24/13 (from the past 48 hour(s))  COMPREHENSIVE METABOLIC PANEL     Status: Abnormal   Collection Time    04/24/13 12:54 PM      Result Value Ref Range   Sodium 138  137 - 147 mEq/L   Potassium 4.3  3.7 - 5.3 mEq/L   Chloride 100  96 - 112 mEq/L   CO2 23  19 - 32 mEq/L   Glucose, Bld 174 (*) 70 - 99 mg/dL   BUN 21  6 - 23 mg/dL   Creatinine, Ser 1.57 (*) 0.50 - 1.35 mg/dL   Calcium 9.7  8.4 - 10.5 mg/dL   Total Protein 7.8  6.0 - 8.3 Chambers/dL   Albumin 3.9  3.5 - 5.2 Chambers/dL   AST 22  0 - 37 U/L   ALT 12  0 - 53 U/L  Alkaline Phosphatase 79  39 - 117 U/L   Total Bilirubin 0.8  0.3 - 1.2 mg/dL   GFR calc non Af Amer 38 (*) >90 mL/min   GFR calc Af Amer 44 (*) >90 mL/min   Comment: (NOTE)     The eGFR has been calculated using the CKD EPI equation.     This calculation has not been validated in all clinical situations.     eGFR's persistently <90 mL/min signify possible Chronic Kidney     Disease.  CBC WITH DIFFERENTIAL     Status: Abnormal   Collection Time    04/24/13 12:54 PM      Result Value Ref Range   WBC 14.2 (*) 4.0 - 10.5 K/uL   RBC 5.68  4.22 - 5.81 MIL/uL   Hemoglobin 16.1  13.0 - 17.0  Chambers/dL   HCT 47.2  39.0 - 52.0 %   MCV 83.1  78.0 - 100.0 fL   MCH 28.3  26.0 - 34.0 pg   MCHC 34.1  30.0 - 36.0 Chambers/dL   RDW 14.3  11.5 - 15.5 %   Platelets 240  150 - 400 K/uL   Neutrophils Relative % 87 (*) 43 - 77 %   Neutro Abs 12.3 (*) 1.7 - 7.7 K/uL   Lymphocytes Relative 6 (*) 12 - 46 %   Lymphs Abs 0.8  0.7 - 4.0 K/uL   Monocytes Relative 7  3 - 12 %   Monocytes Absolute 1.0  0.1 - 1.0 K/uL   Eosinophils Relative 0  0 - 5 %   Eosinophils Absolute 0.0  0.0 - 0.7 K/uL   Basophils Relative 0  0 - 1 %   Basophils Absolute 0.0  0.0 - 0.1 K/uL  LIPASE, BLOOD     Status: None   Collection Time    04/24/13 12:54 PM      Result Value Ref Range   Lipase 35  11 - 59 U/L  PROTIME-INR     Status: Abnormal   Collection Time    04/24/13  1:54 PM      Result Value Ref Range   Prothrombin Time 20.7 (*) 11.6 - 15.2 seconds   INR 1.84 (*) 0.00 - 1.49  TROPONIN I     Status: None   Collection Time    04/24/13  1:54 PM      Result Value Ref Range   Troponin I <0.30  <0.30 ng/mL   Comment:            Due to the release kinetics of cTnI,     a negative result within the first hours     of the onset of symptoms does not rule out     myocardial infarction with certainty.     If myocardial infarction is still suspected,     repeat the test at appropriate intervals.  Randolm Idol, ED     Status: None   Collection Time    04/24/13  2:01 PM      Result Value Ref Range   Troponin i, poc 0.02  0.00 - 0.08 ng/mL   Comment 3            Comment: Due to the release kinetics of cTnI,     a negative result within the first hours     of the onset of symptoms does not rule out     myocardial infarction with certainty.     If myocardial infarction is still suspected,  repeat the test at appropriate intervals.  URINALYSIS, ROUTINE W REFLEX MICROSCOPIC     Status: Abnormal   Collection Time    04/24/13  2:20 PM      Result Value Ref Range   Color, Urine YELLOW  YELLOW   APPearance CLOUDY  (*) CLEAR   Specific Gravity, Urine 1.029  1.005 - 1.030   pH 5.0  5.0 - 8.0   Glucose, UA NEGATIVE  NEGATIVE mg/dL   Hgb urine dipstick NEGATIVE  NEGATIVE   Bilirubin Urine SMALL (*) NEGATIVE   Ketones, ur 15 (*) NEGATIVE mg/dL   Protein, ur 30 (*) NEGATIVE mg/dL   Urobilinogen, UA 1.0  0.0 - 1.0 mg/dL   Nitrite NEGATIVE  NEGATIVE   Leukocytes, UA NEGATIVE  NEGATIVE  URINE MICROSCOPIC-ADD ON     Status: Abnormal   Collection Time    04/24/13  2:20 PM      Result Value Ref Range   Squamous Epithelial / LPF RARE  RARE   WBC, UA 0-2  <3 WBC/hpf   Casts GRANULAR CAST (*) NEGATIVE   Ct Abdomen Pelvis Wo Contrast  04/24/2013   CLINICAL DATA:  Mid epigastric abdominal pain for the past several days, history atrial fibrillation, stomach surgery  EXAM: CT ABDOMEN AND PELVIS WITHOUT CONTRAST  TECHNIQUE: Multidetector CT imaging of the abdomen and pelvis was performed following the standard protocol without intravenous contrast. IV contrast not utilized due to renal insufficiency. Dilute oral contrast administered. Sagittal and coronal MPR images reconstructed from axial data set.  COMPARISON:  None  FINDINGS: Lung bases clear.  Within limits of a nonenhanced exam no focal abnormalities of the liver, spleen, pancreas, or adrenal glands.  Probable small bilateral renal cysts, largest upper pole right kidney 20 x 17 mm image 28.  Significantly distended gallbladder with adjacent infiltration raising question of inflammatory process.  Prior surgery at gastroesophageal junction region.  Stomach and bowel loops otherwise grossly unremarkable.  Significant enlargement of prostate gland 6.0 x 5.4 x 5.9 cm image 77 indenting and elevating bladder base.  Left inguinal hernia containing fat.  Scattered atherosclerotic calcifications.  No mass, adenopathy, free fluid or free air.  Multilevel degenerative disc disease changes of the thoracolumbar spine greatest at L4-L5 with extensive scattered Schmorl's node  formation and osseous demineralization.  IMPRESSION: Distended gallbladder with mild adjacent edema, cannot exclude cholecystitis ; consider followup ultrasound.  Small bilateral renal cysts.  Significant prostatic enlargement.  Small left inguinal hernia containing fat.  Remainder of exam unremarkable.   Electronically Signed   By: Lavonia Dana M.D.   On: 04/24/2013 17:46   US Abdomen Limited  04/24/2013   CLINICAL DATA:  Distended and possibly inflamed gallbladder on CT earlier same date. Mid abdominal pain.  EXAM: US ABDOMEN LIMITED - RIGHT UPPER QUADRANT  COMPARISON:  CT ABD/PELV WO CM dated 04/24/2013  FINDINGS: Gallbladder:  Numerous very small shadowing gallstones, on the order of 5-6 mm in size. Gallbladder wall thickening/edema up to approximately 10 mm. Small amount of pericholecystic fluid. Negative sonographic Murphy sign according to the ultrasound technologist.  Common bile duct:  Diameter: Approximately 5 mm.  Liver:  Diffusely increased and coarsened echotexture without focal hepatic parenchymal abnormality. Patent portal vein with hepatopetal flow.  IMPRESSION: 1. Cholelithiasis. Gallbladder wall thickening up to approximately 10 mm. Small amount of pericholecystic fluid. Findings consistent with cholecystitis. 2. No biliary ductal dilation. 3. Mild diffuse hepatic steatosis without focal hepatic parenchymal abnormality.   Electronically Signed   By:  Evangeline Dakin M.D.   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 Telemetry showed a 7 beat run of nonsustained ventricular tachycardia  Assessment/Plan #1 Atrial Fibrillation: stable on current medications. We will switch him from digoxin to a low dose beta blocker given his nonsustained ventricular tachycardia.  #2 Coumadin anticoagulation:  stable and we will let his INR levels  normalize preoperatively #3 Perioperative Risk:  He is generally physically active man and has no history of coronary artery disease or cerebrovascular disease. For his age, he is fairly fit. The risks of perioperative cardiac morbidity or mortality are low and should not preclude laparoscopic or open surgery if this is felt to be the best approach.   Justin Chambers 04/24/2013, 10:57 PM

## 2013-04-24 NOTE — ED Notes (Signed)
Pt is here with mid abdominal pain that he has had for a couple days and increased last nite.  Pt denies chest pain.  Pt reports pain this am.  Pain does not radiate, no pulsating areas noted

## 2013-04-24 NOTE — ED Notes (Signed)
Wrong patient documented at 43

## 2013-04-24 NOTE — ED Notes (Signed)
Husband stated, I gave her an Ensure this morning for the 1st time and then she started to burp and then the diarrhea.

## 2013-04-24 NOTE — ED Notes (Signed)
Wrong documentation at 1323

## 2013-04-24 NOTE — ED Notes (Signed)
Pt finished drinking oral contrast. CT notified.  

## 2013-04-24 NOTE — ED Provider Notes (Signed)
CSN: [id number]     Arrival date & time 04/24/13  1309 History   First MD Initiated Contact with Patient 04/24/13 1345     Chief Complaint  Patient presents with  . Abdominal Pain     (Consider location/radiation/quality/duration/timing/severity/associated sxs/prior Treatment) Patient is a 80 y.o. male presenting with abdominal pain. The history is provided by the patient. No language interpreter was used.  Abdominal Pain Pain location:  Epigastric Pain quality: aching   Pain radiates to:  Does not radiate Pain severity:  Moderate Onset quality:  Unable to specify Duration: 2-3 days. Timing:  Constant Progression:  Unchanged Chronicity:  New Context: not alcohol use, not eating, not laxative use, not medication withdrawal, not sick contacts, not suspicious food intake and not trauma   Relieved by:  Nothing Worsened by:  Nothing tried Ineffective treatments: tums, zantac. Associated symptoms: no anorexia, no chest pain, no chills, no constipation, no cough, no diarrhea, no dysuria, no fatigue, no fever, no flatus, no hematemesis, no hematochezia, no hematuria, no melena, no nausea, no shortness of breath and no vomiting   Risk factors: being elderly   Risk factors comment:  Coumadin use   Past Medical History  Diagnosis Date  . Atrial fibrillation    Past Surgical History  Procedure Laterality Date  . Abdominal surgery      part of stomach removed   No family history on file. History  Substance Use Topics  . Smoking status: Current Every Day Smoker  . Smokeless tobacco: Not on file  . Alcohol Use: No    Review of Systems  Constitutional: Negative for fever, chills, activity change, appetite change and fatigue.  HENT: Negative for congestion, facial swelling, rhinorrhea and trouble swallowing.   Eyes: Negative for photophobia and pain.  Respiratory: Negative for cough, chest tightness and shortness of breath.   Cardiovascular: Negative for chest pain and leg  swelling.  Gastrointestinal: Positive for abdominal pain. Negative for nausea, vomiting, diarrhea, constipation, melena, hematochezia, anorexia, flatus and hematemesis.  Endocrine: Negative for polydipsia and polyuria.  Genitourinary: Negative for dysuria, urgency, hematuria, decreased urine volume and difficulty urinating.  Musculoskeletal: Negative for back pain and gait problem.  Skin: Negative for color change, rash and wound.  Allergic/Immunologic: Negative for immunocompromised state.  Neurological: Negative for dizziness, facial asymmetry, speech difficulty, weakness, numbness and headaches.  Psychiatric/Behavioral: Negative for confusion, decreased concentration and agitation.      Allergies  Cephalosporins and Latex  Home Medications   Current Outpatient Rx  Name  Route  Sig  Dispense  Refill  . chlorhexidine (PERIDEX) 0.12 % solution   Mouth/Throat   Use as directed 15 mLs in the mouth or throat daily.         Marland Kitchen DIGOXIN PO   Oral   Take 1 tablet by mouth daily.          Marland Kitchen OVER THE COUNTER MEDICATION   Oral   Take 1 tablet by mouth daily. Vitamin         . warfarin (COUMADIN) 2 MG tablet   Oral   Take 1-2 mg by mouth daily. 1 mg (half tablet) on Monday, Wednesday, and Friday  2 mg (full tablet) on Tuesday and Thursday          BP 149/61  Pulse 73  Temp(Src) 97.9 F (36.6 C) (Oral)  Resp 24  SpO2 98% Physical Exam  Constitutional: He is oriented to person, place, and time. He appears well-developed and well-nourished. No distress.  HENT:  Head: Normocephalic and atraumatic.  Mouth/Throat: No oropharyngeal exudate.  Eyes: Pupils are equal, round, and reactive to light.  Neck: Normal range of motion. Neck supple.  Cardiovascular: Normal rate, regular rhythm and normal heart sounds.  Exam reveals no gallop and no friction rub.   No murmur heard. Pulmonary/Chest: Effort normal and breath sounds normal. No respiratory distress. He has no wheezes. He has  no rales.  Abdominal: Soft. Bowel sounds are normal. He exhibits no distension and no mass. There is tenderness in the epigastric area. There is no rigidity, no rebound and no guarding.  Musculoskeletal: Normal range of motion. He exhibits no edema and no tenderness.  Neurological: He is alert and oriented to person, place, and time.  Skin: Skin is warm and dry.  Psychiatric: He has a normal mood and affect.    ED Course  Procedures (including critical care time) Labs Review Labs Reviewed  PROTIME-INR - Abnormal; Notable for the following:    Prothrombin Time 20.7 (*)    INR 1.84 (*)    All other components within normal limits  COMPREHENSIVE METABOLIC PANEL - Abnormal; Notable for the following:    Glucose, Bld 174 (*)    Creatinine, Ser 1.57 (*)    GFR calc non Af Amer 38 (*)    GFR calc Af Amer 44 (*)    All other components within normal limits  CBC WITH DIFFERENTIAL - Abnormal; Notable for the following:    WBC 14.2 (*)    Neutrophils Relative % 87 (*)    Neutro Abs 12.3 (*)    Lymphocytes Relative 6 (*)    All other components within normal limits  URINALYSIS, ROUTINE W REFLEX MICROSCOPIC - Abnormal; Notable for the following:    APPearance CLOUDY (*)    Bilirubin Urine SMALL (*)    Ketones, ur 15 (*)    Protein, ur 30 (*)    All other components within normal limits  URINE MICROSCOPIC-ADD ON - Abnormal; Notable for the following:    Casts GRANULAR CAST (*)    All other components within normal limits  LIPASE, BLOOD  TROPONIN I  I-STAT TROPOININ, ED   Imaging Review Ct Abdomen Pelvis Wo Contrast  04/24/2013   CLINICAL DATA:  Mid epigastric abdominal pain for the past several days, history atrial fibrillation, stomach surgery  EXAM: CT ABDOMEN AND PELVIS WITHOUT CONTRAST  TECHNIQUE: Multidetector CT imaging of the abdomen and pelvis was performed following the standard protocol without intravenous contrast. IV contrast not utilized due to renal insufficiency. Dilute  oral contrast administered. Sagittal and coronal MPR images reconstructed from axial data set.  COMPARISON:  None  FINDINGS: Lung bases clear.  Within limits of a nonenhanced exam no focal abnormalities of the liver, spleen, pancreas, or adrenal glands.  Probable small bilateral renal cysts, largest upper pole right kidney 20 x 17 mm image 28.  Significantly distended gallbladder with adjacent infiltration raising question of inflammatory process.  Prior surgery at gastroesophageal junction region.  Stomach and bowel loops otherwise grossly unremarkable.  Significant enlargement of prostate gland 6.0 x 5.4 x 5.9 cm image 77 indenting and elevating bladder base.  Left inguinal hernia containing fat.  Scattered atherosclerotic calcifications.  No mass, adenopathy, free fluid or free air.  Multilevel degenerative disc disease changes of the thoracolumbar spine greatest at L4-L5 with extensive scattered Schmorl's node formation and osseous demineralization.  IMPRESSION: Distended gallbladder with mild adjacent edema, cannot exclude cholecystitis ; consider followup ultrasound.  Small bilateral renal cysts.  Significant prostatic enlargement.  Small left inguinal hernia containing fat.  Remainder of exam unremarkable.   Electronically Signed   By: Lavonia Dana M.D.   On: 04/24/2013 17:46    EKG Interpretation    Date/Time:  Saturday April 24 2013 13:14:29 EST Ventricular Rate:  68 PR Interval:    QRS Duration: 88 QT Interval:  390 QTC Calculation: 414 R Axis:   -30 Text Interpretation:  Atrial fibrillation with a competing junctional pacemaker Left axis deviation \\E \ ST depression in II, III, aVF which is more prominent than prior tracing. Similar ST depression in V4-V6 Abnormal ECG Confirmed by DOCHERTY  MD, [place] [phone]) on 04/24/2013 1:48:11 PM            MDM   Final diagnoses:  Epigastric pain    Pt is a 80 y.o. male with Pmhx as above who presents with 3 days of constant epigastric pain.   No CP, SOB, n/v, d/a, fever, melena, hematuria.  Pain unchanged w/ eating, currently a 2/10.  Denies CP, SOB. Very mild ttp on PE w/o rebound or guarding.   EKG w/ mild ST depression throughout that was present on prior, but more pronounced. Labs remarkable for WBC 14.2. Hb stable, Cr at baseline. LFTs, lipase nml. Trop not elevated.  Urine clean.  Plan for CT ab/pelvis.   5:50 PM CT shows distended GB, mild adjacent edema.  Will get RUQ [place].  Care transferred to Dr. Zachery Dakins.  Pt's pain much improved after IV protonix and 2mg  IV morphine.     Neta Ehlers, MD 04/24/13 [phone]

## 2013-04-25 ENCOUNTER — Encounter (HOSPITAL_COMMUNITY): Payer: Self-pay | Admitting: General Practice

## 2013-04-25 LAB — COMPREHENSIVE METABOLIC PANEL
ALBUMIN: 2.9 g/dL — AB (ref 3.5–5.2)
ALK PHOS: 65 U/L (ref 39–117)
ALT: 23 U/L (ref 0–53)
AST: 26 U/L (ref 0–37)
BUN: 24 mg/dL — ABNORMAL HIGH (ref 6–23)
CO2: 24 mEq/L (ref 19–32)
Calcium: 8.8 mg/dL (ref 8.4–10.5)
Chloride: 100 mEq/L (ref 96–112)
Creatinine, Ser: 1.56 mg/dL — ABNORMAL HIGH (ref 0.50–1.35)
GFR calc Af Amer: 44 mL/min — ABNORMAL LOW (ref >90)
GFR calc non Af Amer: 38 mL/min — ABNORMAL LOW (ref >90)
GLUCOSE: 126 mg/dL — AB (ref 70–99)
Potassium: 4.5 mEq/L (ref 3.7–5.3)
SODIUM: 137 meq/L (ref 137–147)
Total Bilirubin: 1.1 mg/dL (ref 0.3–1.2)
Total Protein: 6.3 g/dL (ref 6.0–8.3)

## 2013-04-25 LAB — CBC
HCT: 42.5 % (ref 39.0–52.0)
HCT: 41.6 % (ref 39.0–52.0)
HEMOGLOBIN: 14 g/dL (ref 13.0–17.0)
Hemoglobin: 14.1 g/dL (ref 13.0–17.0)
MCH: 27.3 pg (ref 26.0–34.0)
MCH: 27.8 pg (ref 26.0–34.0)
MCHC: 33.2 g/dL (ref 30.0–36.0)
MCHC: 33.7 g/dL (ref 30.0–36.0)
MCV: 82.4 fL (ref 78.0–100.0)
MCV: 82.5 fL (ref 78.0–100.0)
Platelets: 222 10*3/uL (ref 150–400)
Platelets: 219 10*3/uL (ref 150–400)
RBC: 5.16 MIL/uL (ref 4.22–5.81)
RBC: 5.04 MIL/uL (ref 4.22–5.81)
RDW: 14.4 % (ref 11.5–15.5)
RDW: 14.6 % (ref 11.5–15.5)
WBC: 19 10*3/uL — AB (ref 4.0–10.5)
WBC: 22.3 10*3/uL — AB (ref 4.0–10.5)

## 2013-04-25 LAB — CREATININE, SERUM
Creatinine, Ser: 1.6 mg/dL — ABNORMAL HIGH (ref 0.50–1.35)
GFR calc Af Amer: 43 mL/min — ABNORMAL LOW (ref >90)
GFR, EST NON AFRICAN AMERICAN: 37 mL/min — AB (ref >90)

## 2013-04-25 LAB — PROTIME-INR
INR: 2.55 — ABNORMAL HIGH (ref 0.00–1.49)
Prothrombin Time: 26.6 seconds — ABNORMAL HIGH (ref 11.6–15.2)

## 2013-04-25 MED ORDER — PHYTONADIONE 5 MG PO TABS
10.0000 mg | ORAL_TABLET | Freq: Once | ORAL | Status: AC
Start: 2013-04-25 — End: 2013-04-25
  Administered 2013-04-25: 10 mg via ORAL
  Filled 2013-04-25: qty 2

## 2013-04-25 MED ORDER — PIPERACILLIN-TAZOBACTAM 3.375 G IVPB
3.3750 g | Freq: Three times a day (TID) | INTRAVENOUS | Status: DC
  Administered 2013-04-25 – 2013-04-30 (×14): 3.375 g via INTRAVENOUS
  Filled 2013-04-25 (×19): qty 50

## 2013-04-25 NOTE — Progress Notes (Signed)
Patient alert and oriented x2 throughout shift; disoriented to location and situation.  Patient very confused at times throughout day, trying to get up out of bed and "get home to [place]"; family and MD aware.  Patient's family at bedside this evening, including son Justin Chambers. who is POA.  Ed should be the first contact for any information or updates regarding the patient's care.  Family requesting updates earlier this afternoon; MD paged and son's phone number provided.  Patient resting comfortably this evening, phone and call bell within reach.  Bed in lowest position with bed alarm on.  Patient denies any pain, questions, or concerns at this time.  Will continue to monitor.

## 2013-04-25 NOTE — Progress Notes (Signed)
Subjective: He is confused today and wants to go home to [facility], not having abdominal pain or nausea or dyspnea or chest pain.  Objective: Vital signs in last 24 hours: Temp:  [97.2 F (36.2 C)-99.1 F (37.3 C)] 97.6 F (36.4 C) (02/22 0900) Pulse Rate:  [52-98] 68 (02/22 0900) Resp:  [12-24] 20 (02/22 0900) BP: (120-187)/(52-95) 139/72 mmHg (02/22 0900) SpO2:  [96 %-100 %] 97 % (02/22 0900) Weight:  [67.405 kg (148 lb 9.6 oz)] 67.405 kg (148 lb 9.6 oz) (02/22 0156) Weight change:    Intake/Output from previous day: 02/21 0701 - 02/22 0700 In: 505 [I.V.:305; IV Piggyback:200] Out: 150 [Urine:150]   General appearance: alert, cooperative, no distress and confused Resp: clear to auscultation bilaterally Cardio: slightly irregular rhythm with a 2/6 SEM GI: normal bowel sounds and minimal RUQ tenderness Extremities: extremities normal, atraumatic, no cyanosis or edema  Lab Results:  Recent Labs  04/24/13 2253 04/25/13 0442  WBC 19.0* 22.3*  HGB 14.1 14.0  HCT 42.5 41.6  PLT 222 219   BMET  Recent Labs  04/24/13 1254 04/24/13 2253 04/25/13 0442  NA 138  --  137  K 4.3  --  4.5  CL 100  --  100  CO2 23  --  24  GLUCOSE 174*  --  126*  BUN 21  --  24*  CREATININE 1.57* 1.60* 1.56*  CALCIUM 9.7  --  8.8   CMET CMP     Component Value Date/Time   NA 137 04/25/2013 0442   K 4.5 04/25/2013 0442   CL 100 04/25/2013 0442   CO2 24 04/25/2013 0442   GLUCOSE 126* 04/25/2013 0442   BUN 24* 04/25/2013 0442   CREATININE 1.56* 04/25/2013 0442   CALCIUM 8.8 04/25/2013 0442   PROT 6.3 04/25/2013 0442   ALBUMIN 2.9* 04/25/2013 0442   AST 26 04/25/2013 0442   ALT 23 04/25/2013 0442   ALKPHOS 65 04/25/2013 0442   BILITOT 1.1 04/25/2013 0442   GFRNONAA 38* 04/25/2013 0442   GFRAA 44* 04/25/2013 0442    CBG (last 3)  No results found for this basename: GLUCAP,  in the last 72 hours  INR RESULTS:   Lab Results  Component Value Date   INR 2.55* 04/25/2013   INR 1.84*  04/24/2013   INR 3.9* 05/09/2008     Studies/Results: Ct Abdomen Pelvis Wo Contrast  04/24/2013   CLINICAL DATA:  Mid epigastric abdominal pain for the past several days, history atrial fibrillation, stomach surgery  EXAM: CT ABDOMEN AND PELVIS WITHOUT CONTRAST  TECHNIQUE: Multidetector CT imaging of the abdomen and pelvis was performed following the standard protocol without intravenous contrast. IV contrast not utilized due to renal insufficiency. Dilute oral contrast administered. Sagittal and coronal MPR images reconstructed from axial data set.  COMPARISON:  None  FINDINGS: Lung bases clear.  Within limits of a nonenhanced exam no focal abnormalities of the liver, spleen, pancreas, or adrenal glands.  Probable small bilateral renal cysts, largest upper pole right kidney 20 x 17 mm image 28.  Significantly distended gallbladder with adjacent infiltration raising question of inflammatory process.  Prior surgery at gastroesophageal junction region.  Stomach and bowel loops otherwise grossly unremarkable.  Significant enlargement of prostate gland 6.0 x 5.4 x 5.9 cm image 77 indenting and elevating bladder base.  Left inguinal hernia containing fat.  Scattered atherosclerotic calcifications.  No mass, adenopathy, free fluid or free air.  Multilevel degenerative disc disease changes of the thoracolumbar spine greatest  at L4-L5 with extensive scattered Schmorl's node formation and osseous demineralization.  IMPRESSION: Distended gallbladder with mild adjacent edema, cannot exclude cholecystitis ; consider followup ultrasound.  Small bilateral renal cysts.  Significant prostatic enlargement.  Small left inguinal hernia containing fat.  Remainder of exam unremarkable.   Electronically Signed   By: Lavonia Dana M.D.   On: 04/24/2013 17:46   US Abdomen Limited  04/24/2013   CLINICAL DATA:  Distended and possibly inflamed gallbladder on CT earlier same date. Mid abdominal pain.  EXAM: US ABDOMEN LIMITED - RIGHT UPPER  QUADRANT  COMPARISON:  CT ABD/PELV WO CM dated 04/24/2013  FINDINGS: Gallbladder:  Numerous very small shadowing gallstones, on the order of 5-6 mm in size. Gallbladder wall thickening/edema up to approximately 10 mm. Small amount of pericholecystic fluid. Negative sonographic Murphy sign according to the ultrasound technologist.  Common bile duct:  Diameter: Approximately 5 mm.  Liver:  Diffusely increased and coarsened echotexture without focal hepatic parenchymal abnormality. Patent portal vein with hepatopetal flow.  IMPRESSION: 1. Cholelithiasis. Gallbladder wall thickening up to approximately 10 mm. Small amount of pericholecystic fluid. Findings consistent with cholecystitis. 2. No biliary ductal dilation. 3. Mild diffuse hepatic steatosis without focal hepatic parenchymal abnormality.   Electronically Signed   By: Evangeline Dakin M.D.   On: 04/24/2013 20:32    Medications: I have reviewed the patient's current medications.  Assessment/Plan: #1 Leukocytosis: likely due to acute on chronic cholecystitis, less likely from other sources. Will change to Zosyn and order HIDA scan for definitive evaluation of gallbladder function. #2 Atrial Fibrillation: stable with change from digoxin to metoprolol. Will give vitamin K to reverse coumadin effect. #3 Confusion: interval worsening from Alzheimers disease and acute infection. #4 Murmur: likely due to aortic stenosis without significant sxs, and will request echocardiogram.   LOS: 1 day   Tayquan Gassman G 04/25/2013, 12:08 PM

## 2013-04-25 NOTE — Progress Notes (Signed)
Subjective: Mild epigastric pain otherwise well  Objective: Vital signs in last 24 hours: Temp:  [97.2 F (36.2 C)-99.1 F (37.3 C)] 97.7 F (36.5 C) (02/22 0635) Pulse Rate:  [52-98] 64 (02/22 0635) Resp:  [12-24] 18 (02/22 0635) BP: (120-187)/(52-95) 125/52 mmHg (02/22 0635) SpO2:  [96 %-100 %] 97 % (02/22 0635) Weight:  [148 lb 9.6 oz (67.405 kg)] 148 lb 9.6 oz (67.405 kg) (02/22 0156) Last BM Date: 04/23/13  Intake/Output from previous day: 02/21 0701 - 02/22 0700 In: 505 [I.V.:305; IV Piggyback:200] Out: 150 [Urine:150] Intake/Output this shift:    GI: soft not really tender today does not have murphys sign, upper midline healed  Lab Results:   Recent Labs  04/24/13 2253 04/25/13 0442  WBC 19.0* 22.3*  HGB 14.1 14.0  HCT 42.5 41.6  PLT 222 219   BMET  Recent Labs  04/24/13 1254 04/24/13 2253 04/25/13 0442  NA 138  --  137  K 4.3  --  4.5  CL 100  --  100  CO2 23  --  24  GLUCOSE 174*  --  126*  BUN 21  --  24*  CREATININE 1.57* 1.60* 1.56*  CALCIUM 9.7  --  8.8   PT/INR  Recent Labs  04/24/13 1354 04/25/13 0442  LABPROT 20.7* 26.6*  INR 1.84* 2.55*   ABG No results found for this basename: PHART, PCO2, PO2, HCO3,  in the last 72 hours  Studies/Results: Ct Abdomen Pelvis Wo Contrast  04/24/2013   CLINICAL DATA:  Mid epigastric abdominal pain for the past several days, history atrial fibrillation, stomach surgery  EXAM: CT ABDOMEN AND PELVIS WITHOUT CONTRAST  TECHNIQUE: Multidetector CT imaging of the abdomen and pelvis was performed following the standard protocol without intravenous contrast. IV contrast not utilized due to renal insufficiency. Dilute oral contrast administered. Sagittal and coronal MPR images reconstructed from axial data set.  COMPARISON:  None  FINDINGS: Lung bases clear.  Within limits of a nonenhanced exam no focal abnormalities of the liver, spleen, pancreas, or adrenal glands.  Probable small bilateral renal cysts,  largest upper pole right kidney 20 x 17 mm image 28.  Significantly distended gallbladder with adjacent infiltration raising question of inflammatory process.  Prior surgery at gastroesophageal junction region.  Stomach and bowel loops otherwise grossly unremarkable.  Significant enlargement of prostate gland 6.0 x 5.4 x 5.9 cm image 77 indenting and elevating bladder base.  Left inguinal hernia containing fat.  Scattered atherosclerotic calcifications.  No mass, adenopathy, free fluid or free air.  Multilevel degenerative disc disease changes of the thoracolumbar spine greatest at L4-L5 with extensive scattered Schmorl's node formation and osseous demineralization.  IMPRESSION: Distended gallbladder with mild adjacent edema, cannot exclude cholecystitis ; consider followup ultrasound.  Small bilateral renal cysts.  Significant prostatic enlargement.  Small left inguinal hernia containing fat.  Remainder of exam unremarkable.   Electronically Signed   By: Lavonia Dana M.D.   On: 04/24/2013 17:46   US Abdomen Limited  04/24/2013   CLINICAL DATA:  Distended and possibly inflamed gallbladder on CT earlier same date. Mid abdominal pain.  EXAM: US ABDOMEN LIMITED - RIGHT UPPER QUADRANT  COMPARISON:  CT ABD/PELV WO CM dated 04/24/2013  FINDINGS: Gallbladder:  Numerous very small shadowing gallstones, on the order of 5-6 mm in size. Gallbladder wall thickening/edema up to approximately 10 mm. Small amount of pericholecystic fluid. Negative sonographic Murphy sign according to the ultrasound technologist.  Common bile duct:  Diameter: Approximately 5 mm.  Liver:  Diffusely increased and coarsened echotexture without focal hepatic parenchymal abnormality. Patent portal vein with hepatopetal flow.  IMPRESSION: 1. Cholelithiasis. Gallbladder wall thickening up to approximately 10 mm. Small amount of pericholecystic fluid. Findings consistent with cholecystitis. 2. No biliary ductal dilation. 3. Mild diffuse hepatic steatosis  without focal hepatic parenchymal abnormality.   Electronically Signed   By: Evangeline Dakin M.D.   On: 04/24/2013 20:32    Anti-infectives: Anti-infectives   Start     Dose/Rate Route Frequency Ordered Stop   04/25/13 0600  Ampicillin-Sulbactam (UNASYN) 3 g in sodium chloride 0.9 % 100 mL IVPB     3 g 100 mL/hr over 60 Minutes Intravenous Every 8 hours 04/24/13 2313     04/24/13 2145  Ampicillin-Sulbactam (UNASYN) 3 g in sodium chloride 0.9 % 100 mL IVPB     3 g 100 mL/hr over 60 Minutes Intravenous  Once 04/24/13 2132 04/24/13 2325      Assessment/Plan: Likely cholecystitis  I think he does appear to have cholecystitis.  His wbc is up today.  I would plan consider switch to zosyn for treatment of what is likely complicated gb infection as there is some resistance to unasyn.  Also I think a perc drain given length of symptoms, 1 cm wall and prior surgery to do surgery at a later date is likely the best plan for him.  Will need inr to be normal for that though.    [facility] 04/25/2013

## 2013-04-25 NOTE — Progress Notes (Signed)
Utilization review completed.  

## 2013-04-25 NOTE — Progress Notes (Signed)
ANTIBIOTIC CONSULT NOTE - INITIAL  Pharmacy Consult for Zosyn Indication: Gallbladder infection  Allergies  Allergen Reactions  . Cephalosporins     Unknown, previously documented in [facility], pt does not recall reactions  . Latex     Patient Measurements: Height: 5\' 11"  (180.3 cm) Weight: 148 lb 9.6 oz (67.405 kg) IBW/kg (Calculated) : 75.3  Vital Signs: Temp: 97.6 F (36.4 C) (02/22 0900) Temp src: Oral (02/22 0900) BP: 139/72 mmHg (02/22 0900) Pulse Rate: 68 (02/22 0900) Intake/Output from previous day: 02/21 0701 - 02/22 0700 In: 505 [I.V.:305; IV Piggyback:200] Out: 150 [Urine:150] Intake/Output from this shift:    Labs:  Recent Labs  04/24/13 1254 04/24/13 2253 04/25/13 0442  WBC 14.2* 19.0* 22.3*  HGB 16.1 14.1 14.0  PLT 240 222 219  CREATININE 1.57* 1.60* 1.56*   Estimated Creatinine Clearance: 31.8 ml/min (by C-G formula based on Cr of 1.56). No results found for this basename: VANCOTROUGH, VANCOPEAK, VANCORANDOM, GENTTROUGH, GENTPEAK, GENTRANDOM, TOBRATROUGH, TOBRAPEAK, TOBRARND, AMIKACINPEAK, AMIKACINTROU, AMIKACIN,  in the last 72 hours   Microbiology: No results found for this or any previous visit (from the past 720 hour(s)).  Medical History: Past Medical History  Diagnosis Date  . Atrial fibrillation   . Dementia   . Chronic kidney disease     Medications:  Scheduled:  . chlorhexidine  15 mL Mouth/Throat Daily  . metoprolol tartrate  12.5 mg Oral BID  . phytonadione  10 mg Oral Once  . sodium chloride  3 mL Intravenous Q12H   Assessment: 80 y/o M admitted for acute on chronic cholecystitis. Pharmacy has been consulted to dose Zosyn for gallbladder infection. Renal function is elevated but stable. Afebrile, WBC 22.3. No cultures.   Unasyn 2/22 >> 2/22 Zosyn 2/22 >>   Goal of Therapy:  Eradication of infection  Plan:  - Start Zosyn 3.375g IV q8h - Trend WBC, renal function, and temp. - Follow-up plans for surgery  Roderic Palau A.  Pincus Badder, PharmD Clinical Pharmacist - Resident Pager: [phone] Pharmacy: [phone] 04/25/2013 1:42 PM

## 2013-04-26 ENCOUNTER — Inpatient Hospital Stay (HOSPITAL_COMMUNITY): Payer: Medicare Other

## 2013-04-26 DIAGNOSIS — I359 Nonrheumatic aortic valve disorder, unspecified: Secondary | ICD-10-CM

## 2013-04-26 LAB — COMPREHENSIVE METABOLIC PANEL
ALT: 18 U/L (ref 0–53)
AST: 20 U/L (ref 0–37)
Albumin: 2.6 g/dL — ABNORMAL LOW (ref 3.5–5.2)
Alkaline Phosphatase: 66 U/L (ref 39–117)
BUN: 33 mg/dL — ABNORMAL HIGH (ref 6–23)
CO2: 21 mEq/L (ref 19–32)
Calcium: 8.5 mg/dL (ref 8.4–10.5)
Chloride: 105 mEq/L (ref 96–112)
Creatinine, Ser: 1.77 mg/dL — ABNORMAL HIGH (ref 0.50–1.35)
GFR calc Af Amer: 38 mL/min — ABNORMAL LOW (ref >90)
GFR calc non Af Amer: 33 mL/min — ABNORMAL LOW (ref >90)
Glucose, Bld: 136 mg/dL — ABNORMAL HIGH (ref 70–99)
Potassium: 5 mEq/L (ref 3.7–5.3)
Sodium: 140 mEq/L (ref 137–147)
Total Bilirubin: 0.9 mg/dL (ref 0.3–1.2)
Total Protein: 6.1 g/dL (ref 6.0–8.3)

## 2013-04-26 LAB — CBC
HEMATOCRIT: 41.1 % (ref 39.0–52.0)
Hemoglobin: 14 g/dL (ref 13.0–17.0)
MCH: 28 pg (ref 26.0–34.0)
MCHC: 34.1 g/dL (ref 30.0–36.0)
MCV: 82.2 fL (ref 78.0–100.0)
Platelets: 188 10*3/uL (ref 150–400)
RBC: 5 MIL/uL (ref 4.22–5.81)
RDW: 15 % (ref 11.5–15.5)
WBC: 23.3 10*3/uL — ABNORMAL HIGH (ref 4.0–10.5)

## 2013-04-26 LAB — PROTIME-INR
INR: 2.43 — ABNORMAL HIGH (ref 0.00–1.49)
Prothrombin Time: 25.6 seconds — ABNORMAL HIGH (ref 11.6–15.2)

## 2013-04-26 MED ORDER — TECHNETIUM TC 99M MEBROFENIN IV KIT
5.0000 | PACK | Freq: Once | INTRAVENOUS | Status: AC | PRN
  Administered 2013-04-26: 5 via INTRAVENOUS

## 2013-04-26 MED ORDER — MORPHINE SULFATE 4 MG/ML IJ SOLN
2.5000 mg | Freq: Once | INTRAMUSCULAR | Status: AC
  Administered 2013-04-26: 2.5 mg via INTRAVENOUS

## 2013-04-26 MED ORDER — MORPHINE SULFATE 4 MG/ML IJ SOLN
INTRAMUSCULAR | Status: AC
  Administered 2013-04-26: 2.5 mg via INTRAVENOUS
  Filled 2013-04-26: qty 1

## 2013-04-26 NOTE — Progress Notes (Signed)
Subjective: Pt has no pain right now, but when the pain comes its in his RUQ, epigastrium.  No N/V.  +flatus, BM on 04/23/13.  Says he just went down for an xray.  Objective: Vital signs in last 24 hours: Temp:  [97.7 F (36.5 C)-97.9 F (36.6 C)] 97.9 F (36.6 C) (02/23 0619) Pulse Rate:  [59-72] 67 (02/23 1100) Resp:  [18-20] 18 (02/23 0016) BP: (122-137)/(58-74) 122/74 mmHg (02/23 0619) SpO2:  [96 %-98 %] 96 % (02/23 1100) Weight:  [138 lb 3.7 oz (62.7 kg)] 138 lb 3.7 oz (62.7 kg) (02/23 0619) Last BM Date: 04/23/13  Intake/Output from previous day: 02/22 0701 - 02/23 0700 In: 1363.8 [P.O.:240; I.V.:973.8; IV Piggyback:150] Out: 150 [Urine:150] Intake/Output this shift:    PE: Gen:  Alert, NAD, pleasant Abd: Soft, minimal tenderness to deep palpation of the RUQ, no pain any where else, +BS, no HSM   Lab Results:   Recent Labs  04/25/13 0442 04/26/13 0503  WBC 22.3* 23.3*  HGB 14.0 14.0  HCT 41.6 41.1  PLT 219 188   BMET  Recent Labs  04/25/13 0442 04/26/13 0503  NA 137 140  K 4.5 5.0  CL 100 105  CO2 24 21  GLUCOSE 126* 136*  BUN 24* 33*  CREATININE 1.56* 1.77*  CALCIUM 8.8 8.5   PT/INR  Recent Labs  04/25/13 0442 04/26/13 0503  LABPROT 26.6* 25.6*  INR 2.55* 2.43*   CMP     Component Value Date/Time   NA 140 04/26/2013 0503   K 5.0 04/26/2013 0503   CL 105 04/26/2013 0503   CO2 21 04/26/2013 0503   GLUCOSE 136* 04/26/2013 0503   BUN 33* 04/26/2013 0503   CREATININE 1.77* 04/26/2013 0503   CALCIUM 8.5 04/26/2013 0503   PROT 6.1 04/26/2013 0503   ALBUMIN 2.6* 04/26/2013 0503   AST 20 04/26/2013 0503   ALT 18 04/26/2013 0503   ALKPHOS 66 04/26/2013 0503   BILITOT 0.9 04/26/2013 0503   GFRNONAA 33* 04/26/2013 0503   GFRAA 38* 04/26/2013 0503   Lipase     Component Value Date/Time   LIPASE 35 04/24/2013 1254       Studies/Results: Ct Abdomen Pelvis Wo Contrast  04/24/2013   CLINICAL DATA:  Mid epigastric abdominal pain for the past  several days, history atrial fibrillation, stomach surgery  EXAM: CT ABDOMEN AND PELVIS WITHOUT CONTRAST  TECHNIQUE: Multidetector CT imaging of the abdomen and pelvis was performed following the standard protocol without intravenous contrast. IV contrast not utilized due to renal insufficiency. Dilute oral contrast administered. Sagittal and coronal MPR images reconstructed from axial data set.  COMPARISON:  None  FINDINGS: Lung bases clear.  Within limits of a nonenhanced exam no focal abnormalities of the liver, spleen, pancreas, or adrenal glands.  Probable small bilateral renal cysts, largest upper pole right kidney 20 x 17 mm image 28.  Significantly distended gallbladder with adjacent infiltration raising question of inflammatory process.  Prior surgery at gastroesophageal junction region.  Stomach and bowel loops otherwise grossly unremarkable.  Significant enlargement of prostate gland 6.0 x 5.4 x 5.9 cm image 77 indenting and elevating bladder base.  Left inguinal hernia containing fat.  Scattered atherosclerotic calcifications.  No mass, adenopathy, free fluid or free air.  Multilevel degenerative disc disease changes of the thoracolumbar spine greatest at L4-L5 with extensive scattered Schmorl's node formation and osseous demineralization.  IMPRESSION: Distended gallbladder with mild adjacent edema, cannot exclude cholecystitis ; consider followup ultrasound.  Small bilateral renal  cysts.  Significant prostatic enlargement.  Small left inguinal hernia containing fat.  Remainder of exam unremarkable.   Electronically Signed   By: Lavonia Dana M.D.   On: 04/24/2013 17:46   US Abdomen Limited  04/24/2013   CLINICAL DATA:  Distended and possibly inflamed gallbladder on CT earlier same date. Mid abdominal pain.  EXAM: US ABDOMEN LIMITED - RIGHT UPPER QUADRANT  COMPARISON:  CT ABD/PELV WO CM dated 04/24/2013  FINDINGS: Gallbladder:  Numerous very small shadowing gallstones, on the order of 5-6 mm in size.  Gallbladder wall thickening/edema up to approximately 10 mm. Small amount of pericholecystic fluid. Negative sonographic Murphy sign according to the ultrasound technologist.  Common bile duct:  Diameter: Approximately 5 mm.  Liver:  Diffusely increased and coarsened echotexture without focal hepatic parenchymal abnormality. Patent portal vein with hepatopetal flow.  IMPRESSION: 1. Cholelithiasis. Gallbladder wall thickening up to approximately 10 mm. Small amount of pericholecystic fluid. Findings consistent with cholecystitis. 2. No biliary ductal dilation. 3. Mild diffuse hepatic steatosis without focal hepatic parenchymal abnormality.   Electronically Signed   By: Evangeline Dakin M.D.   On: 04/24/2013 20:32    Anti-infectives: Anti-infectives   Start     Dose/Rate Route Frequency Ordered Stop   04/25/13 1400  piperacillin-tazobactam (ZOSYN) IVPB 3.375 g     3.375 g 12.5 mL/hr over 240 Minutes Intravenous 3 times per day 04/25/13 1344     04/25/13 0600  Ampicillin-Sulbactam (UNASYN) 3 g in sodium chloride 0.9 % 100 mL IVPB  Status:  Discontinued     3 g 100 mL/hr over 60 Minutes Intravenous Every 8 hours 04/24/13 2313 04/25/13 1232   04/24/13 2145  Ampicillin-Sulbactam (UNASYN) 3 g in sodium chloride 0.9 % 100 mL IVPB     3 g 100 mL/hr over 60 Minutes Intravenous  Once 04/24/13 2132 04/24/13 2325       Assessment/Plan RUQ/epigastric abdominal pain Thickened gallbladder wall Leukocytosis - 23.3 Anticoagulated normally on Coumadin for AFIB CKD Dementia  Plan: 1.  Await HIDA scan results 2.  If HIDA negative would not recommend surgical intervention, but if positive would likely recommend perc drainage as opposed to cholecystectomy, but will discuss this with Dr. [name] when we have the results 3.  If OR is indicated then will need pre-op clearance and INR fully reversed still 2.43 at 5am 4.  Continue pain control, antiemetics, antibiotics (zosyn day 2)     LOS: 2 days    DORT,  Prisca Gearing 04/26/2013, 2:07 PM Pager: [phone]

## 2013-04-26 NOTE — Progress Notes (Signed)
Patient's family at bedside this afternoon, requesting updates about plan of care.  [name], patient's son, is healthcare POA.  Paged Dr. [name], and provided son's phone number for him to call.  Paged surgical team.  Will, PA at bedside this afternoon to speak with family.  Family still has many questions, concerned that he will be taken for surgery that is "too invasive".  Relayed these concerns to the medical team.  Patient stable; bed alarm on.  Denies any pain at this time.  Will continue to monitor.

## 2013-04-26 NOTE — Progress Notes (Signed)
Hida:  Nonvisualization of the gallbladder worrisome for cystic duct obstruction and acute cholecystitis as supported by preceding abdominal ultrasound and CT. Labs including INR ordered for AM. ECHO ordered NPO after MN for probable surgery tomorrow, if labs and ECHO allow.

## 2013-04-26 NOTE — Progress Notes (Signed)
Awaiting report on HIDA scan before offering any other surgical recommendations.  Imogene Burn. [name], MD, [facility] Surgery  General/ Trauma Surgery  04/26/2013 2:24 PM

## 2013-04-26 NOTE — Progress Notes (Signed)
Hold anticoagulation.  Possible surgery tomorrow if OK with primary team.  Imogene Burn. [name], MD, [facility] Surgery  General/ Trauma Surgery  04/26/2013 3:49 PM

## 2013-04-26 NOTE — Progress Notes (Signed)
Subjective: Patient was awakened sleeping soundly this morning but conversant. A bit confused regarding circumstances. Does continue to relate some right upper quadrant pain no nausea vomiting  Objective: Vital signs in last 24 hours: Temp:  [97.7 F (36.5 C)-97.9 F (36.6 C)] 97.9 F (36.6 C) (02/23 0619) Pulse Rate:  [59-72] 67 (02/23 1100) Resp:  [18-20] 18 (02/23 0016) BP: (122-137)/(58-74) 122/74 mmHg (02/23 0619) SpO2:  [96 %-98 %] 96 % (02/23 1100) Weight:  [62.7 kg (138 lb 3.7 oz)] 62.7 kg (138 lb 3.7 oz) (02/23 0619) Weight change: -4.705 kg (-10 lb 6 oz)  CBG (last 3)  No results found for this basename: GLUCAP,  in the last 72 hours  Intake/Output from previous day: 02/22 0701 - 02/23 0700 In: 1363.8 [P.O.:240; I.V.:973.8; IV Piggyback:150] Out: 150 [Urine:150]  Physical Exam: Alert confused regarding circumstances no distress pleasant Good facial symmetry no JVD or bruits Lungs are clear to auscultation percussion Irregular rhythm 2/6 systolic ejection murmur Abdomen is soft right upper quadrant is tenderness no rebound or guarding Intact distal pulses no cyanosis or clubbing Neurologically nonlateralizing   Lab Results:  Recent Labs  04/25/13 0442 04/26/13 0503  NA 137 140  K 4.5 5.0  CL 100 105  CO2 24 21  GLUCOSE 126* 136*  BUN 24* 33*  CREATININE 1.56* 1.77*  CALCIUM 8.8 8.5    Recent Labs  04/25/13 0442 04/26/13 0503  AST 26 20  ALT 23 18  ALKPHOS 65 66  BILITOT 1.1 0.9  PROT 6.3 6.1  ALBUMIN 2.9* 2.6*    Recent Labs  04/24/13 1254  04/25/13 0442 04/26/13 0503  WBC 14.2*  < > 22.3* 23.3*  NEUTROABS 12.3*  --   --   --   HGB 16.1  < > 14.0 14.0  HCT 47.2  < > 41.6 41.1  MCV 83.1  < > 82.5 82.2  PLT 240  < > 219 188  < > = values in this interval not displayed. Lab Results  Component Value Date   INR 2.43* 04/26/2013   INR 2.55* 04/25/2013   INR 1.84* 04/24/2013    Recent Labs  04/24/13 1354  TROPONINI <0.30   No  results found for this basename: TSH, T4TOTAL, FREET3, T3FREE, THYROIDAB,  in the last 72 hours No results found for this basename: VITAMINB12, FOLATE, FERRITIN, TIBC, IRON, RETICCTPCT,  in the last 72 hours  Studies/Results: Ct Abdomen Pelvis Wo Contrast  04/24/2013   CLINICAL DATA:  Mid epigastric abdominal pain for the past several days, history atrial fibrillation, stomach surgery  EXAM: CT ABDOMEN AND PELVIS WITHOUT CONTRAST  TECHNIQUE: Multidetector CT imaging of the abdomen and pelvis was performed following the standard protocol without intravenous contrast. IV contrast not utilized due to renal insufficiency. Dilute oral contrast administered. Sagittal and coronal MPR images reconstructed from axial data set.  COMPARISON:  None  FINDINGS: Lung bases clear.  Within limits of a nonenhanced exam no focal abnormalities of the liver, spleen, pancreas, or adrenal glands.  Probable small bilateral renal cysts, largest upper pole right kidney 20 x 17 mm image 28.  Significantly distended gallbladder with adjacent infiltration raising question of inflammatory process.  Prior surgery at gastroesophageal junction region.  Stomach and bowel loops otherwise grossly unremarkable.  Significant enlargement of prostate gland 6.0 x 5.4 x 5.9 cm image 77 indenting and elevating bladder base.  Left inguinal hernia containing fat.  Scattered atherosclerotic calcifications.  No mass, adenopathy, free fluid or free air.  Multilevel  degenerative disc disease changes of the thoracolumbar spine greatest at L4-L5 with extensive scattered Schmorl's node formation and osseous demineralization.  IMPRESSION: Distended gallbladder with mild adjacent edema, cannot exclude cholecystitis ; consider followup ultrasound.  Small bilateral renal cysts.  Significant prostatic enlargement.  Small left inguinal hernia containing fat.  Remainder of exam unremarkable.   Electronically Signed   By: Lavonia Dana M.D.   On: 04/24/2013 17:46   US  Abdomen Limited  04/24/2013   CLINICAL DATA:  Distended and possibly inflamed gallbladder on CT earlier same date. Mid abdominal pain.  EXAM: US ABDOMEN LIMITED - RIGHT UPPER QUADRANT  COMPARISON:  CT ABD/PELV WO CM dated 04/24/2013  FINDINGS: Gallbladder:  Numerous very small shadowing gallstones, on the order of 5-6 mm in size. Gallbladder wall thickening/edema up to approximately 10 mm. Small amount of pericholecystic fluid. Negative sonographic Murphy sign according to the ultrasound technologist.  Common bile duct:  Diameter: Approximately 5 mm.  Liver:  Diffusely increased and coarsened echotexture without focal hepatic parenchymal abnormality. Patent portal vein with hepatopetal flow.  IMPRESSION: 1. Cholelithiasis. Gallbladder wall thickening up to approximately 10 mm. Small amount of pericholecystic fluid. Findings consistent with cholecystitis. 2. No biliary ductal dilation. 3. Mild diffuse hepatic steatosis without focal hepatic parenchymal abnormality.   Electronically Signed   By: Evangeline Dakin M.D.   On: 04/24/2013 20:32     Assessment/Plan: #1 cholecystitis- IV antibiotics, hepatobiliary scan, potential percutaneous drain awaiting surgical decisions  #2 chronic atrial fibrillation anticoagulated currently reversed  #3 mild toxic metabolic encephalopathy secondary to 1 potential he superimposed on some mild cognitive impairment has been fairly independent up until that  Await echocardiogram, hepatobiliary scan continue IV antibiotics    LOS: 2 days   Haillie Radu A 04/26/2013, 1:01 PM

## 2013-04-26 NOTE — Progress Notes (Signed)
  Echocardiogram 2D Echocardiogram has been performed.  Mauricio Po 04/26/2013, 5:55 PM

## 2013-04-27 ENCOUNTER — Inpatient Hospital Stay (HOSPITAL_COMMUNITY): Payer: Medicare Other

## 2013-04-27 ENCOUNTER — Encounter (HOSPITAL_COMMUNITY): Payer: Medicare Other | Admitting: Certified Registered Nurse Anesthetist

## 2013-04-27 ENCOUNTER — Inpatient Hospital Stay (HOSPITAL_COMMUNITY): Payer: Medicare Other | Admitting: Certified Registered Nurse Anesthetist

## 2013-04-27 ENCOUNTER — Encounter (HOSPITAL_COMMUNITY)
Admission: EM | Disposition: A | Discharge status: Skilled Nursing Facility | Payer: Self-pay | Source: Home / Self Care | Attending: Internal Medicine

## 2013-04-27 DIAGNOSIS — K810 Acute cholecystitis: Secondary | ICD-10-CM

## 2013-04-27 HISTORY — PX: CHOLECYSTECTOMY: SHX55

## 2013-04-27 HISTORY — PX: LAPAROSCOPIC LYSIS OF ADHESIONS: SHX5905

## 2013-04-27 LAB — CBC
HEMATOCRIT: 36.6 % — AB (ref 39.0–52.0)
Hemoglobin: 12.4 g/dL — ABNORMAL LOW (ref 13.0–17.0)
MCH: 27.9 pg (ref 26.0–34.0)
MCHC: 33.9 g/dL (ref 30.0–36.0)
MCV: 82.4 fL (ref 78.0–100.0)
PLATELETS: 161 10*3/uL (ref 150–400)
RBC: 4.44 MIL/uL (ref 4.22–5.81)
RDW: 15.2 % (ref 11.5–15.5)
WBC: 19.3 10*3/uL — AB (ref 4.0–10.5)

## 2013-04-27 LAB — COMPREHENSIVE METABOLIC PANEL
ALK PHOS: 71 U/L (ref 39–117)
ALT: 12 U/L (ref 0–53)
AST: 14 U/L (ref 0–37)
Albumin: 2.1 g/dL — ABNORMAL LOW (ref 3.5–5.2)
BILIRUBIN TOTAL: 0.6 mg/dL (ref 0.3–1.2)
BUN: 40 mg/dL — ABNORMAL HIGH (ref 6–23)
CHLORIDE: 109 meq/L (ref 96–112)
CO2: 22 mEq/L (ref 19–32)
Calcium: 8.3 mg/dL — ABNORMAL LOW (ref 8.4–10.5)
Creatinine, Ser: 1.89 mg/dL — ABNORMAL HIGH (ref 0.50–1.35)
GFR calc Af Amer: 35 mL/min — ABNORMAL LOW (ref >90)
GFR calc non Af Amer: 30 mL/min — ABNORMAL LOW (ref >90)
Glucose, Bld: 151 mg/dL — ABNORMAL HIGH (ref 70–99)
POTASSIUM: 4.8 meq/L (ref 3.7–5.3)
SODIUM: 144 meq/L (ref 137–147)
TOTAL PROTEIN: 5.7 g/dL — AB (ref 6.0–8.3)

## 2013-04-27 LAB — MRSA PCR SCREENING: MRSA BY PCR: NEGATIVE

## 2013-04-27 LAB — PROTIME-INR
INR: 1.36 (ref 0.00–1.49)
Prothrombin Time: 16.4 seconds — ABNORMAL HIGH (ref 11.6–15.2)

## 2013-04-27 LAB — GLUCOSE, CAPILLARY: GLUCOSE-CAPILLARY: 123 mg/dL — AB (ref 70–99)

## 2013-04-27 SURGERY — LAPAROSCOPIC CHOLECYSTECTOMY WITH INTRAOPERATIVE CHOLANGIOGRAM
Anesthesia: General | Site: Abdomen

## 2013-04-27 MED ORDER — FENTANYL CITRATE 0.05 MG/ML IJ SOLN
INTRAMUSCULAR | Status: AC
  Filled 2013-04-27: qty 5

## 2013-04-27 MED ORDER — FENTANYL CITRATE 0.05 MG/ML IJ SOLN
INTRAMUSCULAR | Status: DC | PRN
  Administered 2013-04-27: 50 ug via INTRAVENOUS
  Administered 2013-04-27: 25 ug via INTRAVENOUS
  Administered 2013-04-27 (×2): 50 ug via INTRAVENOUS
  Administered 2013-04-27: 150 ug via INTRAVENOUS

## 2013-04-27 MED ORDER — ROCURONIUM BROMIDE 50 MG/5ML IV SOLN
INTRAVENOUS | Status: AC
  Filled 2013-04-27: qty 1

## 2013-04-27 MED ORDER — ETOMIDATE 2 MG/ML IV SOLN
INTRAVENOUS | Status: AC
  Filled 2013-04-27: qty 10

## 2013-04-27 MED ORDER — SODIUM CHLORIDE 0.9 % IV SOLN
INTRAVENOUS | Status: DC
  Administered 2013-04-27: 23:00:00 via INTRAVENOUS

## 2013-04-27 MED ORDER — NEOSTIGMINE METHYLSULFATE 1 MG/ML IJ SOLN
INTRAMUSCULAR | Status: DC | PRN
  Administered 2013-04-27: 4 mg via INTRAVENOUS

## 2013-04-27 MED ORDER — EPHEDRINE SULFATE 50 MG/ML IJ SOLN
INTRAMUSCULAR | Status: DC | PRN
  Administered 2013-04-27 (×2): 5 mg via INTRAVENOUS
  Administered 2013-04-27: 10 mg via INTRAVENOUS

## 2013-04-27 MED ORDER — ROCURONIUM BROMIDE 100 MG/10ML IV SOLN
INTRAVENOUS | Status: DC | PRN
  Administered 2013-04-27: 40 mg via INTRAVENOUS

## 2013-04-27 MED ORDER — ONDANSETRON HCL 4 MG PO TABS: 4.0000 mg | ORAL_TABLET | Freq: Four times a day (QID) | ORAL | Status: DC | PRN

## 2013-04-27 MED ORDER — ETOMIDATE 2 MG/ML IV SOLN
INTRAVENOUS | Status: DC | PRN
  Administered 2013-04-27: 16 mg via INTRAVENOUS

## 2013-04-27 MED ORDER — HEMOSTATIC AGENTS (NO CHARGE) OPTIME
TOPICAL | Status: DC | PRN
  Administered 2013-04-27: 1

## 2013-04-27 MED ORDER — LACTATED RINGERS IV SOLN
INTRAVENOUS | Status: DC | PRN
  Administered 2013-04-27: 12:00:00 via INTRAVENOUS

## 2013-04-27 MED ORDER — SODIUM CHLORIDE 0.9 % IV SOLN
INTRAVENOUS | Status: DC | PRN
  Administered 2013-04-27: 13:00:00

## 2013-04-27 MED ORDER — ONDANSETRON HCL 4 MG/2ML IJ SOLN
INTRAMUSCULAR | Status: DC | PRN
  Administered 2013-04-27: 4 mg via INTRAVENOUS

## 2013-04-27 MED ORDER — LIDOCAINE HCL (CARDIAC) 20 MG/ML IV SOLN
INTRAVENOUS | Status: AC
  Filled 2013-04-27: qty 5

## 2013-04-27 MED ORDER — 0.9 % SODIUM CHLORIDE (POUR BTL) OPTIME
TOPICAL | Status: DC | PRN
  Administered 2013-04-27: 1000 mL

## 2013-04-27 MED ORDER — ONDANSETRON HCL 4 MG/2ML IJ SOLN
INTRAMUSCULAR | Status: AC
  Filled 2013-04-27: qty 2

## 2013-04-27 MED ORDER — ALBUMIN HUMAN 5 % IV SOLN
INTRAVENOUS | Status: DC | PRN
  Administered 2013-04-27: 12:00:00 via INTRAVENOUS

## 2013-04-27 MED ORDER — ESMOLOL HCL 10 MG/ML IV SOLN
INTRAVENOUS | Status: DC | PRN
  Administered 2013-04-27 (×4): 10 mg via INTRAVENOUS

## 2013-04-27 MED ORDER — BUPIVACAINE-EPINEPHRINE 0.25% -1:200000 IJ SOLN
INTRAMUSCULAR | Status: DC | PRN
  Administered 2013-04-27: 15 mL

## 2013-04-27 MED ORDER — ONDANSETRON HCL 4 MG/2ML IJ SOLN: 4.0000 mg | Freq: Four times a day (QID) | INTRAMUSCULAR | Status: DC | PRN

## 2013-04-27 MED ORDER — GLYCOPYRROLATE 0.2 MG/ML IJ SOLN
INTRAMUSCULAR | Status: DC | PRN
  Administered 2013-04-27: 0.6 mg via INTRAVENOUS

## 2013-04-27 MED ORDER — MORPHINE SULFATE 2 MG/ML IJ SOLN: 2.0000 mg | INTRAMUSCULAR | Status: DC | PRN

## 2013-04-27 MED ORDER — FENTANYL CITRATE 0.05 MG/ML IJ SOLN
INTRAMUSCULAR | Status: AC
Start: 2013-04-27 — End: 2013-04-27
  Filled 2013-04-27: qty 5

## 2013-04-27 MED ORDER — FENTANYL CITRATE 0.05 MG/ML IJ SOLN: 25.0000 ug | INTRAMUSCULAR | Status: DC | PRN

## 2013-04-27 MED ORDER — PROPOFOL 10 MG/ML IV BOLUS
INTRAVENOUS | Status: AC
  Filled 2013-04-27: qty 20

## 2013-04-27 MED ORDER — SODIUM CHLORIDE 0.9 % IV SOLN
INTRAVENOUS | Status: DC | PRN
  Administered 2013-04-27 (×2): via INTRAVENOUS

## 2013-04-27 MED ORDER — LIDOCAINE HCL (CARDIAC) 20 MG/ML IV SOLN
INTRAVENOUS | Status: DC | PRN
  Administered 2013-04-27: 60 mg via INTRAVENOUS

## 2013-04-27 MED ORDER — SODIUM CHLORIDE 0.9 % IR SOLN
Status: DC | PRN
  Administered 2013-04-27: 1000 mL

## 2013-04-27 SURGICAL SUPPLY — 59 items
APL SKNCLS STERI-STRIP NONHPOA (GAUZE/BANDAGES/DRESSINGS) ×1
APPLIER CLIP ROT 10 11.4 M/L (STAPLE) ×3
APR CLP MED LRG 11.4X10 (STAPLE) ×1
BAG SPEC RTRVL LRG 6X4 10 (ENDOMECHANICALS) ×1
BENZOIN TINCTURE PRP APPL 2/3 (GAUZE/BANDAGES/DRESSINGS) ×3 IMPLANT
BLADE SURG ROTATE 9660 (MISCELLANEOUS) ×2 IMPLANT
CANISTER SUCTION 2500CC (MISCELLANEOUS) ×3 IMPLANT
CHLORAPREP W/TINT 26ML (MISCELLANEOUS) ×3 IMPLANT
CLIP APPLIE ROT 10 11.4 M/L (STAPLE) ×1 IMPLANT
CLOSURE WOUND 1/2 X4 (GAUZE/BANDAGES/DRESSINGS) ×1
COVER MAYO STAND STRL (DRAPES) ×3 IMPLANT
COVER SURGICAL LIGHT HANDLE (MISCELLANEOUS) ×3 IMPLANT
DRAIN CHANNEL 19F RND (DRAIN) ×2 IMPLANT
DRAPE C-ARM 42X72 X-RAY (DRAPES) ×3 IMPLANT
DRAPE UTILITY 15X26 W/TAPE STR (DRAPE) ×6 IMPLANT
DRSG TEGADERM 2-3/8X2-3/4 SM (GAUZE/BANDAGES/DRESSINGS) ×9 IMPLANT
DRSG TEGADERM 4X4.75 (GAUZE/BANDAGES/DRESSINGS) ×3 IMPLANT
ELECT REM PT RETURN 9FT ADLT (ELECTROSURGICAL) ×3
ELECTRODE REM PT RTRN 9FT ADLT (ELECTROSURGICAL) ×1 IMPLANT
ENDOLOOP SUT PDS II  0 18 (SUTURE) ×2
ENDOLOOP SUT PDS II 0 18 (SUTURE) IMPLANT
EVACUATOR SILICONE 100CC (DRAIN) ×2 IMPLANT
FILTER SMOKE EVAC LAPAROSHD (FILTER) ×3 IMPLANT
GAUZE SPONGE 2X2 8PLY STRL LF (GAUZE/BANDAGES/DRESSINGS) ×1 IMPLANT
GLOVE BIOGEL PI IND STRL 6.5 (GLOVE) IMPLANT
GLOVE BIOGEL PI IND STRL 7.5 (GLOVE) ×1 IMPLANT
GLOVE BIOGEL PI IND STRL 8 (GLOVE) IMPLANT
GLOVE BIOGEL PI INDICATOR 6.5 (GLOVE) ×2
GLOVE BIOGEL PI INDICATOR 7.5 (GLOVE) ×10
GLOVE BIOGEL PI INDICATOR 8 (GLOVE) ×2
GLOVE SURG SS PI 6.5 STRL IVOR (GLOVE) ×2 IMPLANT
GLOVE SURG SS PI 7.0 STRL IVOR (GLOVE) ×2 IMPLANT
GLOVE SURG SS PI 7.5 STRL IVOR (GLOVE) ×10 IMPLANT
GLOVE SURG SS PI 8.0 STRL IVOR (GLOVE) ×2 IMPLANT
GOWN STRL REUS W/ TWL LRG LVL3 (GOWN DISPOSABLE) IMPLANT
GOWN STRL REUS W/ TWL XL LVL3 (GOWN DISPOSABLE) IMPLANT
GOWN STRL REUS W/TWL LRG LVL3 (GOWN DISPOSABLE) ×15
GOWN STRL REUS W/TWL XL LVL3 (GOWN DISPOSABLE) ×6
KIT BASIN OR (CUSTOM PROCEDURE TRAY) ×3 IMPLANT
KIT ROOM TURNOVER OR (KITS) ×3 IMPLANT
NS IRRIG 1000ML POUR BTL (IV SOLUTION) ×3 IMPLANT
PAD ARMBOARD 7.5X6 YLW CONV (MISCELLANEOUS) ×5 IMPLANT
POUCH SPECIMEN RETRIEVAL 10MM (ENDOMECHANICALS) ×3 IMPLANT
SCISSORS LAP 5X35 DISP (ENDOMECHANICALS) ×3 IMPLANT
SET CHOLANGIOGRAPH 5 50 .035 (SET/KITS/TRAYS/PACK) ×3 IMPLANT
SET IRRIG TUBING LAPAROSCOPIC (IRRIGATION / IRRIGATOR) ×3 IMPLANT
SLEEVE ENDOPATH XCEL 5M (ENDOMECHANICALS) ×5 IMPLANT
SPECIMEN JAR SMALL (MISCELLANEOUS) ×3 IMPLANT
SPONGE GAUZE 2X2 STER 10/PKG (GAUZE/BANDAGES/DRESSINGS) ×2
STRIP CLOSURE SKIN 1/2X4 (GAUZE/BANDAGES/DRESSINGS) ×1 IMPLANT
SUT ETHILON 2 0 FS 18 (SUTURE) ×4 IMPLANT
SUT MNCRL AB 4-0 PS2 18 (SUTURE) ×5 IMPLANT
TOWEL OR 17X24 6PK STRL BLUE (TOWEL DISPOSABLE) ×3 IMPLANT
TOWEL OR 17X26 10 PK STRL BLUE (TOWEL DISPOSABLE) ×3 IMPLANT
TRAY FOLEY BAG SILVER LF 16FR (CATHETERS) ×2 IMPLANT
TRAY LAPAROSCOPIC (CUSTOM PROCEDURE TRAY) ×3 IMPLANT
TROCAR XCEL BLUNT TIP 100MML (ENDOMECHANICALS) ×3 IMPLANT
TROCAR XCEL NON-BLD 11X100MML (ENDOMECHANICALS) ×3 IMPLANT
TROCAR XCEL NON-BLD 5MMX100MML (ENDOMECHANICALS) ×3 IMPLANT

## 2013-04-27 NOTE — Anesthesia Procedure Notes (Addendum)
Procedure Name: Intubation Date/Time: 04/27/2013 12:04 PM Performed by: Raphael Gibney T Pre-anesthesia Checklist: Patient identified, Timeout performed, Emergency Drugs available, Suction available and Patient being monitored Patient Re-evaluated:Patient Re-evaluated prior to inductionOxygen Delivery Method: Circle system utilized and Simple face mask Preoxygenation: Pre-oxygenation with 100% oxygen Intubation Type: IV induction Ventilation: Mask ventilation without difficulty Laryngoscope Size: Miller and 3 Grade View: Grade I Tube type: Oral Tube size: 7.5 mm Number of attempts: 1 Airway Equipment and Method: Patient positioned with wedge pillow and Stylet Placement Confirmation: ETT inserted through vocal cords under direct vision,  positive ETCO2 and breath sounds checked- equal and bilateral Secured at: 23 cm Tube secured with: Tape Dental Injury: Teeth and Oropharynx as per pre-operative assessment    Anesthesia Procedure Note Anesthesia Procedure Note

## 2013-04-27 NOTE — Transfer of Care (Signed)
Immediate Anesthesia Transfer of Care Note  Patient: Justin Chambers  Procedure(s) Performed: Procedure(s): LAPAROSCOPIC CHOLECYSTECTOMY WITH INTRAOPERATIVE CHOLANGIOGRAM (N/A) LAPAROSCOPIC LYSIS OF ADHESIONS (N/A)  Patient Location: PACU  Anesthesia Type:General  Level of Consciousness: awake  Airway & Oxygen Therapy: Patient Spontanous Breathing and Patient connected to face mask oxygen  Post-op Assessment: Report given to PACU RN and Post -op Vital signs reviewed and stable  Post vital signs: Reviewed and stable  Complications: No apparent anesthesia complications

## 2013-04-27 NOTE — Progress Notes (Addendum)
Subjective: Patient comfortable with no complaints this morning, except some right shoulder pain HIDA - no filling of gallbladder  Objective: Vital signs in last 24 hours: Temp:  [97.1 F (36.2 C)-98 F (36.7 C)] 97.4 F (36.3 C) (02/24 0525) Pulse Rate:  [60-68] 64 (02/24 0525) Resp:  [20] 20 (02/24 0525) BP: (142-163)/(63-78) 151/63 mmHg (02/24 0525) SpO2:  [94 %-98 %] 94 % (02/24 0525) Weight:  [141 lb 3.2 oz (64.048 kg)] 141 lb 3.2 oz (64.048 kg) (02/24 0351) Last BM Date: 04/23/13  Intake/Output from previous day: 02/23 0701 - 02/24 0700 In: [place] [P.O.:240; I.V.:1500; IV Piggyback:100] Out: 375 [Urine:375] Intake/Output this shift:    General appearance: alert, cooperative and no distress GI: soft, non-tender; no masses; healed upper midline incision  Lab Results:   Recent Labs  04/26/13 0503 04/27/13 0440  WBC 23.3* 19.3*  HGB 14.0 12.4*  HCT 41.1 36.6*  PLT 188 161   BMET  Recent Labs  04/26/13 0503 04/27/13 0440  NA 140 144  K 5.0 4.8  CL 105 109  CO2 21 22  GLUCOSE 136* 151*  BUN 33* 40*  CREATININE 1.77* 1.89*  CALCIUM 8.5 8.3*   PT/INR  Recent Labs  04/26/13 0503 04/27/13 0440  LABPROT 25.6* 16.4*  INR 2.43* 1.36   ABG No results found for this basename: PHART, PCO2, PO2, HCO3,  in the last 72 hours  Studies/Results: Nm Hepato W/eject Fract  04/26/2013   CLINICAL DATA:  Mid epigastric abdominal pain, evaluate for cholecystitis  EXAM: NUCLEAR MEDICINE HEPATOBILIARY IMAGING  TECHNIQUE: Sequential images of the abdomen were obtained out to 60 minutes following intravenous administration of radiopharmaceutical. The patient was then administered 2.5 mg of morphine and image for an additional 30 min  COMPARISON:  US ABDOMEN LIMITED dated 04/24/2013; CT ABD/PELV WO CM dated 04/24/2013  RADIOPHARMACEUTICALS:  5 mCi Tc-48m Choletec  FINDINGS: There is homogeneous distribution of injected radiotracer throughout the hepatic parenchyma. There is  early excretion of injected radiotracer with opacification of the common bile duct and proximal small bowel, initially seen on the 15 min anterior projection plantar image.  Despite the examination the and augmented with intravenous morphine with scintigraphic imaging performed for an additional 30 min, there is no definitive visualization of the gallbladder.  IMPRESSION: Nonvisualization of the gallbladder worrisome for cystic duct obstruction and acute cholecystitis as supported by preceding abdominal ultrasound and CT.  These results will be called to the ordering clinician or representative by the Radiologist Assistant, and communication documented in the PACS Dashboard.   Electronically Signed   By: Sandi Mariscal M.D.   On: 04/26/2013 14:40    Anti-infectives: Anti-infectives   Start     Dose/Rate Route Frequency Ordered Stop   04/25/13 1400  piperacillin-tazobactam (ZOSYN) IVPB 3.375 g     3.375 g 12.5 mL/hr over 240 Minutes Intravenous 3 times per day 04/25/13 1344     04/25/13 0600  Ampicillin-Sulbactam (UNASYN) 3 g in sodium chloride 0.9 % 100 mL IVPB  Status:  Discontinued     3 g 100 mL/hr over 60 Minutes Intravenous Every 8 hours 04/24/13 2313 04/25/13 1232   04/24/13 2145  Ampicillin-Sulbactam (UNASYN) 3 g in sodium chloride 0.9 % 100 mL IVPB     3 g 100 mL/hr over 60 Minutes Intravenous  Once 04/24/13 2132 04/24/13 2325      Assessment/Plan: s/p * No surgery found * Acute cholecystitis  Options include - 1.  No intervention except antibiotics  2.  Percutaneous cholecystostomy  3.  Laparoscopic, possible open cholecystectomy.  He is at higher risk for perioperative complications, but he is not likely to improve significantly under the current circumstances.  Will discuss with family.  LOS: 3 days    [name] 04/27/2013   Discussed with son.  Will proceed with laparoscopic cholecystectomy with cholangiogram, possible laparoscopic placement of percutaneous cholecystostomy  tube, possible open choleystectomy.  The surgical procedure has been discussed with the patient.  Potential risks, benefits, alternative treatments, and expected outcomes have been explained.  All of the patient's questions at this time have been answered.  The likelihood of reaching the patient's treatment goal is good.  The patient understand the proposed surgical procedure and wishes to proceed.  Justin Chambers. [name], MD, [facility]  General/ Trauma Surgery  04/27/2013 8:13 AM

## 2013-04-27 NOTE — Anesthesia Postprocedure Evaluation (Signed)
Anesthesia Post Note  Patient: Justin Chambers  Procedure(s) Performed: Procedure(s) (LRB): LAPAROSCOPIC CHOLECYSTECTOMY WITH INTRAOPERATIVE CHOLANGIOGRAM (N/A) LAPAROSCOPIC LYSIS OF ADHESIONS (N/A)  Anesthesia type: general  Patient location: PACU  Post pain: Pain level controlled  Post assessment: Patient's Cardiovascular Status Stable  Post vital signs: Reviewed and stable  Level of consciousness: sedated  Complications: No apparent anesthesia complications

## 2013-04-27 NOTE — Progress Notes (Signed)
Patient seen and examined this am.  Echo noted--chronic afib, rate controlled, normal left ventricualr function, moderate As.  INR normal.  Clearly confused but medically stable.  Recommend proceeding with surgery this am asap in this window.  Mental status will not likely clear until gallbladder is out and we can mobilize as well as wean some medications.  Discussed both with his son and POA as well as his companion.  All understand that he is high risk.  Son wants a call from Psychologist, sport and exercise. Son is Ed-- [phone].

## 2013-04-27 NOTE — Progress Notes (Signed)
Dr.Edwards aware of elevated hr with afib and HTN--does not wish to treat in pacu but she did call for cardiology consult to assess pt in icu. Will cont to monitor

## 2013-04-27 NOTE — Anesthesia Preprocedure Evaluation (Addendum)
Anesthesia Evaluation  Patient identified by MRN, date of birth, ID band Patient awake    Reviewed: Allergy & Precautions, H&P , NPO status , Patient's Chart, lab work & pertinent test results, reviewed documented beta blocker date and time   Airway Mallampati: II      Dental   Pulmonary neg pulmonary ROS,  breath sounds clear to auscultation        Cardiovascular + CAD + dysrhythmias Atrial Fibrillation Rhythm:Regular Rate:Normal     Neuro/Psych    GI/Hepatic negative GI ROS, Neg liver ROS,   Endo/Other    Renal/GU Renal disease     Musculoskeletal   Abdominal   Peds  Hematology   Anesthesia Other Findings   Reproductive/Obstetrics                        Anesthesia Physical Anesthesia Plan  ASA: III  Anesthesia Plan: General   Post-op Pain Management:    Induction: Intravenous  Airway Management Planned: Oral ETT  Additional Equipment:   Intra-op Plan:   Post-operative Plan: Possible Post-op intubation/ventilation  Informed Consent: I have reviewed the patients History and Physical, chart, labs and discussed the procedure including the risks, benefits and alternatives for the proposed anesthesia with the patient or authorized representative who has indicated his/her understanding and acceptance.   Dental advisory given  Plan Discussed with: CRNA, Anesthesiologist and Surgeon  Anesthesia Plan Comments:        Anesthesia Quick Evaluation

## 2013-04-27 NOTE — Preoperative (Signed)
Beta Blockers   Reason not to administer Beta Blockers:Not Applicable 

## 2013-04-27 NOTE — Progress Notes (Signed)
Subjective: Called by Dr. [name] and Dr. Oletta Lamas regarding patients perioperative course.  Patient with gangrenous gallbladder, successfully removed laparoscopically.  Developed Afib with RVR requiring Esmolol boluses but settled down in PACU with rates remaining below 90s.  Patient is currently resting comfortably- awakens and answers questions though appears to have some confusion.    Objective: Vital signs in last 24 hours: Temp:  [97 F (36.1 C)-98.4 F (36.9 C)] 98.4 F (36.9 C) (02/24 1630) Pulse Rate:  [40-89] 65 (02/24 1745) Resp:  [12-31] 20 (02/24 1745) BP: (119-159)/(56-101) 159/76 mmHg (02/24 1745) SpO2:  [94 %-100 %] 97 % (02/24 1745) Arterial Line BP: (146-188)/(54-107) 163/67 mmHg (02/24 1745) Weight:  [64.048 kg (141 lb 3.2 oz)] 64.048 kg (141 lb 3.2 oz) (02/24 0351) Weight change: 1.348 kg (2 lb 15.6 oz) Last BM Date: 04/23/13  CBG (last 3)   Recent Labs  04/27/13 1623  GLUCAP 123*    Intake/Output from previous day: 02/23 0701 - 02/24 0700 In: 2190 [P.O.:240; I.V.:1800; IV Piggyback:150] Out: 375 [Urine:375] Intake/Output this shift: Total I/O In: 2250 [I.V.:2000; IV Piggyback:250] Out: 235 [Urine:110; Drains:25; Blood:100]  General appearance: alert and disoriented Eyes: no scleral icterus Throat: oropharynx moist without erythema Resp: clear to auscultation bilaterally Cardio: irregularly irregular rhythm GI: soft, non-tender; bowel sounds normal; no masses,  no organomegaly Extremities: no clubbing, cyanosis; trace edema, SCDs in place   Lab Results:  Recent Labs  04/26/13 0503 04/27/13 0440  NA 140 144  K 5.0 4.8  CL 105 109  CO2 21 22  GLUCOSE 136* 151*  BUN 33* 40*  CREATININE 1.77* 1.89*  CALCIUM 8.5 8.3*    Recent Labs  04/26/13 0503 04/27/13 0440  AST 20 14  ALT 18 12  ALKPHOS 66 71  BILITOT 0.9 0.6  PROT 6.1 5.7*  ALBUMIN 2.6* 2.1*    Recent Labs  04/26/13 0503 04/27/13 0440  WBC 23.3* 19.3*  HGB 14.0 12.4*  HCT  41.1 36.6*  MCV 82.2 82.4  PLT 188 161   Lab Results  Component Value Date   INR 1.36 04/27/2013   INR 2.43* 04/26/2013   INR 2.55* 04/25/2013   No results found for this basename: CKTOTAL, CKMB, CKMBINDEX, TROPONINI,  in the last 72 hours No results found for this basename: TSH, T4TOTAL, FREET3, T3FREE, THYROIDAB,  in the last 72 hours No results found for this basename: VITAMINB12, FOLATE, FERRITIN, TIBC, IRON, RETICCTPCT,  in the last 72 hours  Studies/Results: Dg Cholangiogram Operative  04/27/2013   CLINICAL DATA:  Cholecystitis  EXAM: INTRAOPERATIVE CHOLANGIOGRAM  TECHNIQUE: Cholangiographic images from the C-arm fluoroscopic device were submitted for interpretation post-operatively. Please see the procedural report for the amount of contrast and the fluoroscopy time utilized.  COMPARISON:  None.  FINDINGS: No persistent filling defects in the common duct. There is a small diverticulum of the distal CBD. Intrahepatic ducts are incompletely visualized, appearing decompressed centrally. Contrast passes into the duodenum.  : Negative for retained common duct stone.   Electronically Signed   By: Arne Cleveland M.D.   On: 04/27/2013 13:27   Nm Hepato W/eject Fract  04/26/2013   CLINICAL DATA:  Mid epigastric abdominal pain, evaluate for cholecystitis  EXAM: NUCLEAR MEDICINE HEPATOBILIARY IMAGING  TECHNIQUE: Sequential images of the abdomen were obtained out to 60 minutes following intravenous administration of radiopharmaceutical. The patient was then administered 2.5 mg of morphine and image for an additional 30 min  COMPARISON:  US ABDOMEN LIMITED dated 04/24/2013; CT ABD/PELV WO CM  dated 04/24/2013  RADIOPHARMACEUTICALS:  5 mCi Tc-74m Choletec  FINDINGS: There is homogeneous distribution of injected radiotracer throughout the hepatic parenchyma. There is early excretion of injected radiotracer with opacification of the common bile duct and proximal small bowel, initially seen on the 15 min  anterior projection plantar image.  Despite the examination the and augmented with intravenous morphine with scintigraphic imaging performed for an additional 30 min, there is no definitive visualization of the gallbladder.  IMPRESSION: Nonvisualization of the gallbladder worrisome for cystic duct obstruction and acute cholecystitis as supported by preceding abdominal ultrasound and CT.  These results will be called to the ordering clinician or representative by the Radiologist Assistant, and communication documented in the PACS Dashboard.   Electronically Signed   By: Sandi Mariscal M.D.   On: 04/26/2013 14:40     Medications: Scheduled: . chlorhexidine  15 mL Mouth/Throat Daily  . metoprolol tartrate  12.5 mg Oral BID  . piperacillin-tazobactam (ZOSYN)  IV  3.375 g Intravenous 3 times per day  . sodium chloride  3 mL Intravenous Q12H   Continuous: . 0.9 % NaCl with KCl 20 mEq / L 75 mL/hr at 04/27/13 1600    Assessment/Plan: Principal Problem: 1. Atrial fibrillation with RVR- rates are currently controlled on Metoprolol without additional treatment.  Will add Cardizem drip if rates sustain above 100.  Close hemodynamic monitoring in ICU overnight.  Resume coumadin tomorrow if stable. 2. Cholecystitis s/p lap cholecystectomy- post-op care per [facility].  Continue Zosyn.  Monitor WBC. 3. Moderate AS- avoid hypotension.   4. Chronic kidney disease (CKD) stage 3- monitor renal function postoperatively.  Continue gentle hydration. 5. Alzheimer disease- hopeful that delirium will clear post-op  Critical Care time- 30 minutes.    LOS: 3 days   Justin Chambers 04/27/2013, 5:55 PM

## 2013-04-27 NOTE — Progress Notes (Signed)
Pt. Alert and stable this am. No s/s of distress or discomfort noted during the night. Pt. Resting in bed. Attempts made to exit bed unassisted during the night. Call light within reach. Pt. Reoriented to time, situation, and location and instructed to call for assistance. RN will continue to monitor pt. For changes in condition. Aolani Piggott, Katherine Roan

## 2013-04-27 NOTE — Op Note (Signed)
Laparoscopic Cholecystectomy with IOC/ laparoscopic lysis of adhesions Procedure Note  Indications: This patient presents with symptomatic gallbladder disease and will undergo laparoscopic cholecystectomy.    Pre-operative Diagnosis: Calculus of gallbladder with acute cholecystitis, without mention of obstruction  Post-operative Diagnosis: Same  Surgeon: Jadon Harbaugh K.   Assistants: Dr. Georganna Skeans  Anesthesia: General endotracheal anesthesia  ASA Class: 3  Procedure Details  The patient was seen again in the Holding Room. The risks, benefits, complications, treatment options, and expected outcomes were discussed with the patient. The possibilities of reaction to medication, pulmonary aspiration, perforation of viscus, bleeding, recurrent infection, finding a normal gallbladder, the need for additional procedures, failure to diagnose a condition, the possible need to convert to an open procedure, and creating a complication requiring transfusion or operation were discussed with the patient. The likelihood of improving the patient's symptoms with return to their baseline status is good.  The patient and/or family concurred with the proposed plan, giving informed consent. The site of surgery properly noted. The patient was taken to Operating Room, identified as NASIR BRIGHT and the procedure verified as Laparoscopic Cholecystectomy with Intraoperative Cholangiogram. A Time Out was held and the above information confirmed.  Prior to the induction of general anesthesia, antibiotic prophylaxis was administered. General endotracheal anesthesia was then administered and tolerated well. After the induction, the abdomen was prepped with Chloraprep and draped in the sterile fashion. The patient was positioned in the supine position.  Local anesthetic agent was injected into the skin below the umbilicus and an incision made. We dissected down to the abdominal fascia with blunt dissection.  The  fascia was incised vertically and we entered the peritoneal cavity bluntly.  A pursestring suture of 0-Vicryl was placed around the fascial opening.  The Hasson cannula was inserted and secured with the stay suture.  Pneumoperitoneum was then created with CO2 and tolerated well without any adverse changes in the patient's vital signs. The patient had significant adhesions to the anterior abdominal wall in the RUQ and the upper midline.  A 5 mm port was placed in the RUQ.  The adhesions were taken down with a combination of sharp and blunt dissection until we were able to see the midline.  An 11-mm port was placed in the subxiphoid position.  An additional 5-mm port was placed in the right upper quadrant. All skin incisions were infiltrated with a local anesthetic agent before making the incision and placing the trocars.  An additional 5 mm port was placed in the midline to aid in retraction and exposure.  We positioned the patient in reverse Trendelenburg, tilted slightly to the patient's left.  There are significant adhesions to the edge of the liver.  We carefully dissected these away until we could identify the gallbladder.  The omentum was densely adherent to the fundus and this was peeled away.  The fundus was grasped and retracted cephalad.  Part of the gallbladder wall was frankly gangrenous.  There was bile-stained fibrinous exudate over the liver.  The gallbladder wall tore as soon as we tried to grasp it and some small stones were spilled. The infundibulum was grasped and retracted laterally, exposing the peritoneum overlying the triangle of Calot. This was then divided and exposed in a blunt fashion. A critical view of the cystic duct and cystic artery was obtained.  The cystic duct was clearly identified and bluntly dissected circumferentially. The cystic duct was ligated with a clip distally.   An incision was made in the cystic  duct and the [facility] cholangiogram catheter introduced. The catheter was  secured using a clip. A cholangiogram was then obtained which showed good visualization of the distal and proximal biliary tree with no sign of filling defects or obstruction.  Contrast flowed easily into the duodenum. The catheter was then removed.   The cystic duct was then ligated with clips and Endoloop and divided. The cystic artery was identified, dissected free, ligated with clips and divided as well.   The gallbladder was dissected from the liver bed in retrograde fashion with the electrocautery. The gallbladder was removed and placed in an Endocatch sac. The liver bed was irrigated and inspected. Hemostasis was achieved with the electrocautery. Copious irrigation was utilized and was repeatedly aspirated until clear.  Emogene Morgan was packed into the gallbladder fossa.  A drain was placed into the gallbladder fossa through the epigastric port site and brought out the lateral port.  This was secured with 2-0 nylon.  The gallbladder and Endocatch sac were then removed through the umbilical port site.  The pursestring suture was used to close the umbilical fascia.    We again inspected the right upper quadrant for hemostasis.  Pneumoperitoneum was released as we removed the trocars.  4-0 Monocryl was used to close the skin.   Benzoin, steri-strips, and clean dressings were applied. The patient was then extubated and brought to the recovery room in stable condition. Instrument, sponge, and needle counts were correct at closure and at the conclusion of the case.   Findings: Gangrenous cholecystitis with Cholelithiasis  Estimated Blood Loss: 300 mL         Drains: JP drain in gallbladder fossa         Specimens: Gallbladder           Complications: None; patient tolerated the procedure well.         Disposition: PACU - hemodynamically stable.         Condition: stable  Justin Chambers. [name], MD, [facility] Surgery  General/ Trauma Surgery  04/27/2013 1:44 PM

## 2013-04-28 ENCOUNTER — Encounter (HOSPITAL_COMMUNITY): Payer: Self-pay | Admitting: Surgery

## 2013-04-28 LAB — CBC
HCT: 36.5 % — ABNORMAL LOW (ref 39.0–52.0)
HEMOGLOBIN: 12.4 g/dL — AB (ref 13.0–17.0)
MCH: 27.8 pg (ref 26.0–34.0)
MCHC: 34 g/dL (ref 30.0–36.0)
MCV: 81.8 fL (ref 78.0–100.0)
PLATELETS: 160 10*3/uL (ref 150–400)
RBC: 4.46 MIL/uL (ref 4.22–5.81)
RDW: 15.5 % (ref 11.5–15.5)
WBC: 19.3 10*3/uL — ABNORMAL HIGH (ref 4.0–10.5)

## 2013-04-28 LAB — COMPREHENSIVE METABOLIC PANEL
ALT: 16 U/L (ref 0–53)
AST: 27 U/L (ref 0–37)
Albumin: 2.1 g/dL — ABNORMAL LOW (ref 3.5–5.2)
Alkaline Phosphatase: 72 U/L (ref 39–117)
BUN: 37 mg/dL — ABNORMAL HIGH (ref 6–23)
CO2: 20 mEq/L (ref 19–32)
Calcium: 8.2 mg/dL — ABNORMAL LOW (ref 8.4–10.5)
Chloride: 111 mEq/L (ref 96–112)
Creatinine, Ser: 1.69 mg/dL — ABNORMAL HIGH (ref 0.50–1.35)
GFR calc Af Amer: 40 mL/min — ABNORMAL LOW (ref >90)
GFR calc non Af Amer: 35 mL/min — ABNORMAL LOW (ref >90)
Glucose, Bld: 132 mg/dL — ABNORMAL HIGH (ref 70–99)
Potassium: 4.6 mEq/L (ref 3.7–5.3)
SODIUM: 145 meq/L (ref 137–147)
Total Bilirubin: 0.6 mg/dL (ref 0.3–1.2)
Total Protein: 5.6 g/dL — ABNORMAL LOW (ref 6.0–8.3)

## 2013-04-28 MED ORDER — WARFARIN - PHARMACIST DOSING INPATIENT
Freq: Every day | Status: DC
  Administered 2013-04-28: 20:00:00

## 2013-04-28 MED ORDER — WARFARIN SODIUM 3 MG PO TABS
3.0000 mg | ORAL_TABLET | Freq: Once | ORAL | Status: AC
  Administered 2013-04-28: 3 mg via ORAL
  Filled 2013-04-28: qty 1

## 2013-04-28 NOTE — Progress Notes (Signed)
UR Completed.  Vergie Living [phone] 04/28/2013

## 2013-04-28 NOTE — Progress Notes (Signed)
Subjective: Patient is more alert this morning clearly recognizes me by first name but certainly confusion regarding circumstances.  He is able to follow some commands.  Objective: Vital signs in last 24 hours: Temp:  [97 F (36.1 C)-98.6 F (37 C)] 98 F (36.7 C) (02/25 1215) Pulse Rate:  [40-107] 78 (02/25 1000) Resp:  [12-36] 18 (02/25 1000) BP: (111-184)/(56-103) 140/84 mmHg (02/25 1000) SpO2:  [94 %-100 %] 95 % (02/25 1000) Arterial Line BP: (73-201)/(54-107) 166/68 mmHg (02/25 1000) Weight:  [73.4 kg (161 lb 13.1 oz)] 73.4 kg (161 lb 13.1 oz) (02/25 0500) Weight change: 9.352 kg (20 lb 9.9 oz)  CBG (last 3)   Recent Labs  04/27/13 1623  GLUCAP 123*    Intake/Output from previous day: 02/24 0701 - 02/25 0700 In: 3353.8 [P.O.:300; I.V.:2741.3; IV Piggyback:312.5] Out: 755 [Urine:555; Drains:100; Blood:100]  Physical Exam: Alert awake in no distress good facial symmetry No JVD or bruits Lungs are clear Heart a vascular irregular rate in the 69S 2/6 systolic ejection murmur Abdomen is soft minimal tenderness Extremities without edema intact pulses Neurologically moves extremities x4 converses confusion regarding circumstances   Lab Results:  Recent Labs  04/27/13 0440 04/28/13 0500  NA 144 145  K 4.8 4.6  CL 109 111  CO2 22 20  GLUCOSE 151* 132*  BUN 40* 37*  CREATININE 1.89* 1.69*  CALCIUM 8.3* 8.2*    Recent Labs  04/27/13 0440 04/28/13 0500  AST 14 27  ALT 12 16  ALKPHOS 71 72  BILITOT 0.6 0.6  PROT 5.7* 5.6*  ALBUMIN 2.1* 2.1*    Recent Labs  04/27/13 0440 04/28/13 0500  WBC 19.3* 19.3*  HGB 12.4* 12.4*  HCT 36.6* 36.5*  MCV 82.4 81.8  PLT 161 160   Lab Results  Component Value Date   INR 1.36 04/27/2013   INR 2.43* 04/26/2013   INR 2.55* 04/25/2013   No results found for this basename: CKTOTAL, CKMB, CKMBINDEX, TROPONINI,  in the last 72 hours No results found for this basename: TSH, T4TOTAL, FREET3, T3FREE, THYROIDAB,  in the  last 72 hours No results found for this basename: VITAMINB12, FOLATE, FERRITIN, TIBC, IRON, RETICCTPCT,  in the last 72 hours  Studies/Results: Dg Cholangiogram Operative  04/27/2013   CLINICAL DATA:  Cholecystitis  EXAM: INTRAOPERATIVE CHOLANGIOGRAM  TECHNIQUE: Cholangiographic images from the C-arm fluoroscopic device were submitted for interpretation post-operatively. Please see the procedural report for the amount of contrast and the fluoroscopy time utilized.  COMPARISON:  None.  FINDINGS: No persistent filling defects in the common duct. There is a small diverticulum of the distal CBD. Intrahepatic ducts are incompletely visualized, appearing decompressed centrally. Contrast passes into the duodenum.  : Negative for retained common duct stone.   Electronically Signed   By: Arne Cleveland M.D.   On: 04/27/2013 13:27   Nm Hepato W/eject Fract  04/26/2013   CLINICAL DATA:  Mid epigastric abdominal pain, evaluate for cholecystitis  EXAM: NUCLEAR MEDICINE HEPATOBILIARY IMAGING  TECHNIQUE: Sequential images of the abdomen were obtained out to 60 minutes following intravenous administration of radiopharmaceutical. The patient was then administered 2.5 mg of morphine and image for an additional 30 min  COMPARISON:  US ABDOMEN LIMITED dated 04/24/2013; CT ABD/PELV WO CM dated 04/24/2013  RADIOPHARMACEUTICALS:  5 mCi Tc-42m Choletec  FINDINGS: There is homogeneous distribution of injected radiotracer throughout the hepatic parenchyma. There is early excretion of injected radiotracer with opacification of the common bile duct and proximal small bowel, initially seen on  the 15 min anterior projection plantar image.  Despite the examination the and augmented with intravenous morphine with scintigraphic imaging performed for an additional 30 min, there is no definitive visualization of the gallbladder.  IMPRESSION: Nonvisualization of the gallbladder worrisome for cystic duct obstruction and acute cholecystitis as  supported by preceding abdominal ultrasound and CT.  These results will be called to the ordering clinician or representative by the Radiologist Assistant, and communication documented in the PACS Dashboard.   Electronically Signed   By: Sandi Mariscal M.D.   On: 04/26/2013 14:40     Assessment/Plan: #1 atrial fibrillation with rapid ventricular response has stabilized his rate 90s and stable hemodynamically at this point #2 moderate aortic stenosis him name Justin Chambers stable #3 chronic kidney disease stage III stable at baseline #4 cholecystitis status post cholecystectomy white count still up continue Zosyn #5 probable mild Alzheimer's disease decompensated clearly improved since just today  We'll monitor, continue antibiotics and consult therapy   LOS: 4 days   Lajuana Patchell A 04/28/2013, 12:40 PM

## 2013-04-28 NOTE — Progress Notes (Signed)
ANTICOAGULATION CONSULT NOTE - Initial Consult  Pharmacy Consult for warfarin Indication: atrial fibrillation  Allergies  Allergen Reactions  . Cephalosporins     Unknown, previously documented in [place], pt does not recall reactions  . Latex     Patient Measurements: Height: 5\' 11"  (180.3 cm) Weight: 161 lb 13.1 oz (73.4 kg) IBW/kg (Calculated) : 75.3  Vital Signs: Temp: 98 F (36.7 C) (02/25 1215) Temp src: Oral (02/25 1215) BP: 151/110 mmHg (02/25 1300) Pulse Rate: 74 (02/25 1300)  Labs:  Recent Labs  04/26/13 0503 04/27/13 0440 04/28/13 0500  HGB 14.0 12.4* 12.4*  HCT 41.1 36.6* 36.5*  PLT 188 161 160  LABPROT 25.6* 16.4*  --   INR 2.43* 1.36  --   CREATININE 1.77* 1.89* 1.69*    Estimated Creatinine Clearance: 32 ml/min (by C-G formula based on Cr of 1.69).   Medical History: Past Medical History  Diagnosis Date  . Atrial fibrillation   . Dementia   . Chronic kidney disease     Medications:  Prescriptions prior to admission  Medication Sig Dispense Refill  . chlorhexidine (PERIDEX) 0.12 % solution Use as directed 15 mLs in the mouth or throat daily.      . digoxin (LANOXIN) 0.125 MG tablet Take 0.125 mg by mouth daily.      Marland Kitchen OVER THE COUNTER MEDICATION Take 1 tablet by mouth daily. Vitamin      . warfarin (COUMADIN) 2 MG tablet Take 1-2 mg by mouth daily. 1 mg (half tablet) on Monday, Wednesday, and Friday  2 mg (full tablet) on Tuesday and Thursday        Assessment: 89 yom on chronic coumadin for afib presented to the hospital with epigastric pain. INR was reversed with vitamin k and pt is now s/p cholecystectomy. Last INR was 1.36 as of 2/24. H/H 12.4/36.5, plts 160, no bleeding noted. Coumadin to resume today. Last coumadin dose PTA on 2/21.   Goal of Therapy:  INR 2-3   Plan:  1. Warfarin 3mg  PO x 1 tonight 2. Daily INR  Javaya [place], Rande Lawman 04/28/2013,1:12 PM

## 2013-04-28 NOTE — Progress Notes (Signed)
1 Day Post-Op  Subjective: Patient seems much more alert this morning.  Able to recognize me and able to remember mutual acquaintances that he hasn't seen in awhile. Mild abdominal soreness No nausea - tolerating clear liquids  Objective: Vital signs in last 24 hours: Temp:  [97 F (36.1 C)-98.6 F (37 C)] 98.6 F (37 C) (02/25 0435) Pulse Rate:  [40-107] 94 (02/25 0700) Resp:  [12-36] 24 (02/25 0700) BP: (111-184)/(56-103) 154/70 mmHg (02/25 0700) SpO2:  [94 %-100 %] 95 % (02/25 0700) Arterial Line BP: (73-201)/(54-107) 174/80 mmHg (02/25 0700) Weight:  [161 lb 13.1 oz (73.4 kg)] 161 lb 13.1 oz (73.4 kg) (02/25 0500) Last BM Date: 04/23/13  Intake/Output from previous day: 02/24 0701 - 02/25 0700 In: 3353.8 [P.O.:300; I.V.:2741.3; IV Piggyback:312.5] Out: 755 [Urine:555; Drains:100; Blood:100] Intake/Output this shift:    General appearance: alert, cooperative and no distress Resp: clear to auscultation bilaterally Cardio: irregularly irregular rhythm GI: soft, minimally tender Dressings dry; bloody drainage in JP bulb  Lab Results:   Recent Labs  04/27/13 0440 04/28/13 0500  WBC 19.3* 19.3*  HGB 12.4* 12.4*  HCT 36.6* 36.5*  PLT 161 160   BMET  Recent Labs  04/27/13 0440 04/28/13 0500  NA 144 145  K 4.8 4.6  CL 109 111  CO2 22 20  GLUCOSE 151* 132*  BUN 40* 37*  CREATININE 1.89* 1.69*  CALCIUM 8.3* 8.2*   PT/INR  Recent Labs  04/26/13 0503 04/27/13 0440  LABPROT 25.6* 16.4*  INR 2.43* 1.36   ABG No results found for this basename: PHART, PCO2, PO2, HCO3,  in the last 72 hours  Studies/Results: Dg Cholangiogram Operative  04/27/2013   CLINICAL DATA:  Cholecystitis  EXAM: INTRAOPERATIVE CHOLANGIOGRAM  TECHNIQUE: Cholangiographic images from the C-arm fluoroscopic device were submitted for interpretation post-operatively. Please see the procedural report for the amount of contrast and the fluoroscopy time utilized.  COMPARISON:  None.  FINDINGS:  No persistent filling defects in the common duct. There is a small diverticulum of the distal CBD. Intrahepatic ducts are incompletely visualized, appearing decompressed centrally. Contrast passes into the duodenum.  : Negative for retained common duct stone.   Electronically Signed   By: Arne Cleveland M.D.   On: 04/27/2013 13:27   Nm Hepato W/eject Fract  04/26/2013   CLINICAL DATA:  Mid epigastric abdominal pain, evaluate for cholecystitis  EXAM: NUCLEAR MEDICINE HEPATOBILIARY IMAGING  TECHNIQUE: Sequential images of the abdomen were obtained out to 60 minutes following intravenous administration of radiopharmaceutical. The patient was then administered 2.5 mg of morphine and image for an additional 30 min  COMPARISON:  US ABDOMEN LIMITED dated 04/24/2013; CT ABD/PELV WO CM dated 04/24/2013  RADIOPHARMACEUTICALS:  5 mCi Tc-69m Choletec  FINDINGS: There is homogeneous distribution of injected radiotracer throughout the hepatic parenchyma. There is early excretion of injected radiotracer with opacification of the common bile duct and proximal small bowel, initially seen on the 15 min anterior projection plantar image.  Despite the examination the and augmented with intravenous morphine with scintigraphic imaging performed for an additional 30 min, there is no definitive visualization of the gallbladder.  IMPRESSION: Nonvisualization of the gallbladder worrisome for cystic duct obstruction and acute cholecystitis as supported by preceding abdominal ultrasound and CT.  These results will be called to the ordering clinician or representative by the Radiologist Assistant, and communication documented in the PACS Dashboard.   Electronically Signed   By: Sandi Mariscal M.D.   On: 04/26/2013 14:40  Anti-infectives: Anti-infectives   Start     Dose/Rate Route Frequency Ordered Stop   04/25/13 1400  piperacillin-tazobactam (ZOSYN) IVPB 3.375 g     3.375 g 12.5 mL/hr over 240 Minutes Intravenous 3 times per day  04/25/13 1344     04/25/13 0600  Ampicillin-Sulbactam (UNASYN) 3 g in sodium chloride 0.9 % 100 mL IVPB  Status:  Discontinued     3 g 100 mL/hr over 60 Minutes Intravenous Every 8 hours 04/24/13 2313 04/25/13 1232   04/24/13 2145  Ampicillin-Sulbactam (UNASYN) 3 g in sodium chloride 0.9 % 100 mL IVPB     3 g 100 mL/hr over 60 Minutes Intravenous  Once 04/24/13 2132 04/24/13 2325      Assessment/Plan: s/p Procedure(s): LAPAROSCOPIC CHOLECYSTECTOMY WITH INTRAOPERATIVE CHOLANGIOGRAM (N/A) LAPAROSCOPIC LYSIS OF ADHESIONS (N/A) Advance diet If OK with primary team, he can move out to a telemetry floor. May restart Coumadin today. Bloody drainage, but Hgb unchanged from before surgery Would continue abx until WBC returns to normal May mobilize with physical therapy  LOS: 4 days    Jionni Helming K. 04/28/2013

## 2013-04-29 DIAGNOSIS — R5381 Other malaise: Secondary | ICD-10-CM

## 2013-04-29 LAB — COMPREHENSIVE METABOLIC PANEL
ALBUMIN: 1.8 g/dL — AB (ref 3.5–5.2)
ALT: 18 U/L (ref 0–53)
AST: 28 U/L (ref 0–37)
Alkaline Phosphatase: 107 U/L (ref 39–117)
BILIRUBIN TOTAL: 0.7 mg/dL (ref 0.3–1.2)
BUN: 41 mg/dL — ABNORMAL HIGH (ref 6–23)
CALCIUM: 8.3 mg/dL — AB (ref 8.4–10.5)
CHLORIDE: 111 meq/L (ref 96–112)
CO2: 20 mEq/L (ref 19–32)
CREATININE: 1.8 mg/dL — AB (ref 0.50–1.35)
GFR calc Af Amer: 37 mL/min — ABNORMAL LOW (ref >90)
GFR calc non Af Amer: 32 mL/min — ABNORMAL LOW (ref >90)
Glucose, Bld: 117 mg/dL — ABNORMAL HIGH (ref 70–99)
Potassium: 4.4 mEq/L (ref 3.7–5.3)
Sodium: 144 mEq/L (ref 137–147)
Total Protein: 5.4 g/dL — ABNORMAL LOW (ref 6.0–8.3)

## 2013-04-29 LAB — CBC
HCT: 35.2 % — ABNORMAL LOW (ref 39.0–52.0)
HEMOGLOBIN: 12 g/dL — AB (ref 13.0–17.0)
MCH: 27.7 pg (ref 26.0–34.0)
MCHC: 34.1 g/dL (ref 30.0–36.0)
MCV: 81.3 fL (ref 78.0–100.0)
Platelets: 188 10*3/uL (ref 150–400)
RBC: 4.33 MIL/uL (ref 4.22–5.81)
RDW: 15.4 % (ref 11.5–15.5)
WBC: 14.8 10*3/uL — ABNORMAL HIGH (ref 4.0–10.5)

## 2013-04-29 LAB — PROTIME-INR
INR: 1.34 (ref 0.00–1.49)
Prothrombin Time: 16.3 seconds — ABNORMAL HIGH (ref 11.6–15.2)

## 2013-04-29 MED ORDER — WARFARIN SODIUM 3 MG PO TABS
3.0000 mg | ORAL_TABLET | Freq: Once | ORAL | Status: AC
  Administered 2013-04-29: 3 mg via ORAL
  Filled 2013-04-29 (×2): qty 1

## 2013-04-29 NOTE — Evaluation (Signed)
Occupational Therapy Evaluation Patient Details Name: Justin Chambers MRN: [id number] DOB: 11/27/1923 Today's Date: 04/29/2013 Time: 7371-0626 OT Time Calculation (min): 40 min  OT Assessment / Plan / Recommendation History of present illness S/P laporascopic cholecysectomy.  Pt with post operative confusion in the setting of dementia.   Clinical Impression   Pt presents with impaired safety/cognition, impaired balance, and generalized weakness interfering with mobility and ADL. Pt is a poor historian.  Questionable if home set up and prior functional level he reported in accurate. Will follow acutely.     OT Assessment  Patient needs continued OT Services    Follow Up Recommendations  Supervision/Assistance - 24 hour;SNF (if family can't provide adequate assistance at home)    Barriers to Discharge      Equipment Recommendations       Recommendations for Other Services    Frequency  Min 2X/week    Precautions / Restrictions Precautions Precautions: Fall Precaution Comments: condom cath, drain Restrictions Weight Bearing Restrictions: No   Pertinent Vitals/Pain VSS, did not complain of pain    ADL  Eating/Feeding: Set up Where Assessed - Eating/Feeding: Chair Grooming: Wash/dry hands;Wash/dry face;Supervision/safety Where Assessed - Grooming: Unsupported sitting Upper Body Bathing: Minimal assistance Where Assessed - Upper Body Bathing: Unsupported sitting Lower Body Bathing: Minimal assistance Where Assessed - Lower Body Bathing: Unsupported sitting;Supported sit to stand Upper Body Dressing: Minimal assistance Where Assessed - Upper Body Dressing: Unsupported sitting Lower Body Dressing: Minimal assistance Where Assessed - Lower Body Dressing: Unsupported sitting;Supported sit to stand Toilet Transfer: Minimal assistance (bed to recliner) Toilet Transfer Method: Sit to stand Equipment Used: Gait belt Transfers/Ambulation Related to ADLs: transfered with min assist to  chair    OT Diagnosis: Generalized weakness;Cognitive deficits  OT Problem List: Decreased activity tolerance;Impaired balance (sitting and/or standing);Decreased strength;Decreased cognition;Decreased safety awareness;Decreased knowledge of use of DME or AE OT Treatment Interventions: Self-care/ADL training;DME and/or AE instruction;Therapeutic activities;Balance training;Patient/family education   OT Goals(Current goals can be found in the care plan section) Acute Rehab OT Goals Patient Stated Goal: did not state OT Goal Formulation: Patient unable to participate in goal setting Time For Goal Achievement: 05/13/13 Potential to Achieve Goals: Good ADL Goals Pt Will Perform Grooming: with supervision;standing Pt Will Perform Upper Body Bathing: with supervision;sitting Pt Will Perform Lower Body Bathing: with supervision;sit to/from stand Pt Will Perform Upper Body Dressing: with supervision;sitting Pt Will Perform Lower Body Dressing: with supervision;sit to/from stand Pt Will Transfer to Toilet: with supervision;ambulating Pt Will Perform Toileting - Clothing Manipulation and hygiene: with supervision;sit to/from stand Additional ADL Goal #1: Pt will call for assistance using nurse call bell to prevent falls.  Visit Information  Last OT Received On: 04/29/13 Assistance Needed: +1 History of Present Illness: S/P laporascopic cholecysectomy.  Pt with post operative confusion in the setting of dementia.       Prior [place] expects to be discharged to:: Private residence Living Arrangements: Alone Available Help at Discharge: Friend(s) Type of Home: House Home Access: Level entry [place]: Two level;Able to live on main level with bedroom/bathroom Alternate Level Stairs-Number of Steps: 1 flight Home Equipment: None Additional Comments: Pt is a poor historian, confused. Prior Function Level of Independence: Independent Comments: Per pt,  he was driving and performing self care independently. Communication Communication: HOH Dominant Hand: Right         Vision/Perception Vision - History Baseline Vision: Wears glasses only for reading   Cognition  Cognition  Arousal/Alertness: Awake/alert Overall Cognitive Status: No family/caregiver present to determine baseline cognitive functioning Area of Impairment: Attention;Memory;Safety/judgement;Awareness Current Attention Level: Sustained Memory: Decreased short-term memory Safety/Judgement: Decreased awareness of safety;Decreased awareness of deficits Awareness: Intellectual General Comments: Pt seated at EOB without gown and telemetry and condom cath on.    Extremity/Trunk Assessment Upper Extremity Assessment Upper Extremity Assessment: Overall WFL for tasks assessed Lower Extremity Assessment Lower Extremity Assessment: Defer to PT evaluation     Mobility Bed Mobility Overal bed mobility: Needs Assistance Bed Mobility: Supine to Sit;Sit to Supine Supine to sit: Supervision Sit to supine: Supervision General bed mobility comments: used rail Transfers Overall transfer level: Needs assistance Transfers: Sit to/from Stand Sit to Stand: Min assist General transfer comment: hand held assist, poor control of descent     Exercise     Balance Balance Overall balance assessment: Needs assistance Sitting-balance support: Feet supported Sitting balance-Leahy Scale: Good Standing balance support: Single extremity supported Standing balance-Leahy Scale: Poor   End of Session OT - End of Session Equipment Utilized During Treatment: Gait belt Activity Tolerance: Patient tolerated treatment well Patient left: in chair;with call bell/phone within reach;with chair alarm set Nurse Communication: Mobility status  GO     Malka So 04/29/2013, 10:42 AM [phone]

## 2013-04-29 NOTE — Consult Note (Signed)
Physical Medicine and Rehabilitation Consult  Reason for Consult: Deconditioning Referring Physician: Dr. 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 male with history of A fib, Alzheimer's dementia, CKD; who was admitted on 04/24/13 with several day h/o epigastric discomfort due to cholelithiasis with cholecystitis likely acute on chronic. He was started on IV antibiotics and HIDA scan with nonvisualization of the gallbladder worrisome for cystic duct obstruction and acute cholecystitis. Chronic coumadin reversed and patient underwent Lap chole on 04/27/13 by Dr. [name]. Delirium resolving with improvement in mentation. OT evaluation done today and patient noted to be deconditioned. MD recommending CIR for progressive therapies.    Patient working with physical therapy. His first time up with his min mod for balance with posterior lean.  his second sit to stand transfer is with supervision Review of Systems  HENT: Positive for hearing loss.   Eyes: Negative for blurred vision.  Respiratory: Negative for cough and shortness of breath.   Cardiovascular: Negative for chest pain and palpitations.  Gastrointestinal: Negative for heartburn, nausea and abdominal pain.  Genitourinary: Negative for dysuria.  Musculoskeletal: Negative for myalgias.  Neurological: Negative for dizziness.    Past Medical History  Diagnosis Date  . Atrial fibrillation   . Dementia   . Chronic kidney disease    Past Surgical History  Procedure Laterality Date  . Abdominal surgery      part of stomach removed  . Partial gastrectomy Bilateral   . Cholecystectomy N/A 04/27/2013    Procedure: LAPAROSCOPIC CHOLECYSTECTOMY WITH INTRAOPERATIVE CHOLANGIOGRAM;  Surgeon: Imogene Burn. [name], MD;  Location: [facility];  Service: General;  Laterality: N/A;  . Laparoscopic lysis of adhesions N/A 04/27/2013    Procedure: LAPAROSCOPIC LYSIS OF ADHESIONS;  Surgeon: Imogene Burn. [name], MD;  Location: [facility];  Service: General;   Laterality: N/A;   History reviewed. No pertinent family history.  Social History: Lives alone. Does have a girlfriend who usually cooks meals. Independent and driving PTA. Daughter in town thinking about [facility]  [facility] past discharge.  Per   reports that he has never smoked. He does not have any smokeless tobacco history on file.Per reports that he does not drink alcohol or use illicit drugs.   Allergies  Allergen Reactions  . Cephalosporins     Unknown, previously documented in [facility], pt does not recall reactions  . Latex    Medications Prior to Admission  Medication Sig Dispense Refill  . chlorhexidine (PERIDEX) 0.12 % solution Use as directed 15 mLs in the mouth or throat daily.      . digoxin (LANOXIN) 0.125 MG tablet Take 0.125 mg by mouth daily.      Marland Kitchen OVER THE COUNTER MEDICATION Take 1 tablet by mouth daily. Vitamin      . warfarin (COUMADIN) 2 MG tablet Take 1-2 mg by mouth daily. 1 mg (half tablet) on Monday, Wednesday, and Friday  2 mg (full tablet) on Tuesday and Thursday        Home: [place] expects to be discharged to:: Private residence Living Arrangements: Alone Available Help at Discharge: Friend(s) Type of Home: House Home Access: Level entry [place]: Two level;Able to live on main level with bedroom/bathroom Alternate Level Stairs-Number of Steps: 1 flight Home Equipment: None Additional Comments: Pt is a poor historian, confused.  Functional History: Prior Function Comments: Per pt, he was driving and performing self care independently. Functional Status:  Mobility:  ADL: ADL Eating/Feeding: Set up Where Assessed - Eating/Feeding: Chair Grooming: Wash/dry hands;Wash/dry face;Supervision/safety Where Assessed - Grooming: Unsupported sitting Upper Body Bathing: Minimal assistance Where Assessed - Upper Body Bathing: Unsupported sitting Lower Body Bathing: Minimal assistance Where Assessed - Lower Body Bathing:  Unsupported sitting;Supported sit to stand Upper Body Dressing: Minimal assistance Where Assessed - Upper Body Dressing: Unsupported sitting Lower Body Dressing: Minimal assistance Where Assessed - Lower Body Dressing: Unsupported sitting;Supported sit to stand Toilet Transfer: Minimal assistance (bed to recliner) Toilet Transfer Method: Sit to stand Equipment Used: Gait belt Transfers/Ambulation Related to ADLs: transfered with min assist to chair  Cognition: Cognition Overall Cognitive Status: No family/caregiver present to determine baseline cognitive functioning Orientation Level: Oriented to person;Oriented to place;Disoriented to situation;Disoriented to time Cognition Arousal/Alertness: Awake/alert Overall Cognitive Status: No family/caregiver present to determine baseline cognitive functioning Area of Impairment: Attention;Memory;Safety/judgement;Awareness Current Attention Level: Sustained Memory: Decreased short-term memory Safety/Judgement: Decreased awareness of safety;Decreased awareness of deficits Awareness: Intellectual General Comments: Pt seated at EOB without gown and telemetry and condom cath on.  Blood pressure 151/71, pulse 75, temperature 97.8 F (36.6 C), temperature source Oral, resp. rate 18, height 5\' 11"  (1.803 m), weight 73.4 kg (161 lb 13.1 oz), SpO2 99.00%. Physical Exam  Nursing note and vitals reviewed. Constitutional: He appears well-developed and well-nourished.  Frequent hiccups noted.   HENT:  Head: Normocephalic and atraumatic.  Eyes: Conjunctivae are normal.  Neck: Normal range of motion. Neck supple.  Cardiovascular: Normal rate.   Respiratory: Effort normal and breath sounds normal. No respiratory distress. He has no wheezes.  GI: Soft. Bowel sounds are normal. He exhibits no distension. There is no tenderness.  JP with serosanguineous drainage.   Musculoskeletal: He exhibits no edema and no tenderness.  Neurological: He is alert.    Oriented to self and place. Needed cues for situation. Follows commands without difficulty but intermittently disorientation noted during exam and needed redirection.    motor strength is 5/5 bilateral deltoid, bicep, tricep, grip, hip flexor, knee extensors, ankle dorsi flexion plantar flexor Sensation intact to light touch in bilateral upper and lower extremities.  Results for orders placed during the hospital encounter of 04/24/13 (from the past 24 hour(s))  COMPREHENSIVE METABOLIC PANEL     Status: Abnormal   Collection Time    04/29/13  2:46 AM      Result Value Ref Range   Sodium 144  137 - 147 mEq/L   Potassium 4.4  3.7 - 5.3 mEq/L   Chloride 111  96 - 112 mEq/L   CO2 20  19 - 32 mEq/L   Glucose, Bld 117 (*) 70 - 99 mg/dL   BUN 41 (*) 6 - 23 mg/dL   Creatinine, Ser 1.80 (*) 0.50 - 1.35 mg/dL   Calcium 8.3 (*) 8.4 - 10.5 mg/dL   Total Protein 5.4 (*) 6.0 - 8.3 g/dL   Albumin 1.8 (*) 3.5 - 5.2 g/dL   AST 28  0 - 37 U/L   ALT 18  0 - 53 U/L   Alkaline Phosphatase 107  39 - 117 U/L   Total Bilirubin 0.7  0.3 - 1.2 mg/dL   GFR calc non Af Amer 32 (*) >90 mL/min   GFR calc Af Amer 37 (*) >90 mL/min  CBC     Status: Abnormal   Collection Time    04/29/13  2:46 AM      Result Value Ref Range   WBC 14.8 (*) 4.0 - 10.5 K/uL  RBC 4.33  4.22 - 5.81 MIL/uL   Hemoglobin 12.0 (*) 13.0 - 17.0 g/dL   HCT 35.2 (*) 39.0 - 52.0 %   MCV 81.3  78.0 - 100.0 fL   MCH 27.7  26.0 - 34.0 pg   MCHC 34.1  30.0 - 36.0 g/dL   RDW 15.4  11.5 - 15.5 %   Platelets 188  150 - 400 K/uL  PROTIME-INR     Status: Abnormal   Collection Time    04/29/13  2:46 AM      Result Value Ref Range   Prothrombin Time 16.3 (*) 11.6 - 15.2 seconds   INR 1.34  0.00 - 1.49   No results found.  Assessment/Plan: Diagnosis: Deconditioning after laparoscopic cholecystectomy postop day #2, hospital day #5 1. Does the need for close, 24 hr/day medical supervision in concert with the patient's rehab needs make it  unreasonable for this patient to be served in a less intensive setting? Potentially 2. Co-Morbidities requiring supervision/potential complications: Atrial fibrillation, chronic kidney disease 3. Due to bladder management, bowel management, safety, skin/wound care, disease management, medication administration and pain management, does the patient require 24 hr/day rehab nursing? Potentially 4. Does the patient require coordinated care of a physician, rehab nurse, PT (1-2 hrs/day, 5 days/week) and OT (1-2 hrs/day, 5 days/week) to address physical and functional deficits in the context of the above medical diagnosis(es)? Potentially Addressing deficits in the following areas: balance, endurance, locomotion, strength, transferring, bowel/bladder control, bathing, dressing, feeding, grooming, toileting and cognition 5. Can the patient actively participate in an intensive therapy program of at least 3 hrs of therapy per day at least 5 days per week? Yes 6. The potential for patient to make measurable gains while on inpatient rehab is fair 7. Anticipated functional outcomes upon discharge from inpatient rehab are see below with PT, see below with OT, see below with SLP. 8. Estimated rehab length of stay to reach the above functional goals is: Stable 9. Does the patient have adequate social supports to accommodate these discharge functional goals? Potentially 10. Anticipated D/C setting: Home 11. Anticipated post D/C treatments: Alpha therapy 12. Overall Rehab/Functional Prognosis: good  RECOMMENDATIONS: This patient's condition is appropriate for continued rehabilitative care in the following setting: If patient progresses to supervision level than either home with family assist vs. SNF. If patient remains at min assist then short CIR stay potentially 5-7 days with supervision to modified goal Patient has agreed to participate in recommended program. Potentially Note that insurance prior authorization may  be required for reimbursement for recommended care.  Comment: Rehabilitation RN to followup on PT progress tomorrow    04/29/2013

## 2013-04-29 NOTE — Progress Notes (Signed)
ANTICOAGULATION CONSULT NOTE - Initial Consult  Pharmacy Consult for warfarin Indication: atrial fibrillation  Allergies  Allergen Reactions  . Cephalosporins     Unknown, previously documented in [facility], pt does not recall reactions  . Latex     Patient Measurements: Height: 5\' 11"  (180.3 cm) Weight: 161 lb 13.1 oz (73.4 kg) IBW/kg (Calculated) : 75.3  Vital Signs: Temp: 97.8 F (36.6 C) (02/26 1351) Temp src: Oral (02/26 1351) BP: 151/71 mmHg (02/26 1351) Pulse Rate: 75 (02/26 1351)  Labs:  Recent Labs  04/27/13 0440 04/28/13 0500 04/29/13 0246  HGB 12.4* 12.4* 12.0*  HCT 36.6* 36.5* 35.2*  PLT 161 160 188  LABPROT 16.4*  --  16.3*  INR 1.36  --  1.34  CREATININE 1.89* 1.69* 1.80*    Estimated Creatinine Clearance: 30 ml/min (by C-G formula based on Cr of 1.8).   Medical History: Past Medical History  Diagnosis Date  . Atrial fibrillation   . Dementia   . Chronic kidney disease     Medications:  Prescriptions prior to admission  Medication Sig Dispense Refill  . chlorhexidine (PERIDEX) 0.12 % solution Use as directed 15 mLs in the mouth or throat daily.      . digoxin (LANOXIN) 0.125 MG tablet Take 0.125 mg by mouth daily.      Marland Kitchen OVER THE COUNTER MEDICATION Take 1 tablet by mouth daily. Vitamin      . warfarin (COUMADIN) 2 MG tablet Take 1-2 mg by mouth daily. 1 mg (half tablet) on Monday, Wednesday, and Friday  2 mg (full tablet) on Tuesday and Thursday        Assessment: 89 yom on chronic coumadin for afib presented to the hospital with epigastric pain. INR was reversed with vitamin k and pt is now s/p cholecystectomy. INR today subtherapeutic at 1.34 . Pt's dose prior to admission is 1 mg po on MWF, 2 mg po on all other days.  H/H 12/35.2, plts 188, no bleeding noted. Coumadin was restarted as of yesterday. Last coumadin dose PTA on 2/21.   Goal of Therapy:  INR 2-3   Plan:  1. Warfarin 3mg  PO x 1 tonight 2. Daily INR  Hughes Better, PharmD,  BCPS Clinical Pharmacist Pager: [phone] 04/29/2013 2:43 PM

## 2013-04-29 NOTE — Progress Notes (Signed)
2 Days Post-Op  Subjective: Patient awake and reading the newspaper this morning. Oriented to the day and place Minimal soreness in abdomen No nausea - tolerating diet  Objective: Vital signs in last 24 hours: Temp:  [98 F (36.7 C)-98.6 F (37 C)] 98.6 F (37 C) (02/26 0421) Pulse Rate:  [56-100] 90 (02/26 0421) Resp:  [18-25] 18 (02/26 0421) BP: (138-176)/(68-110) 176/73 mmHg (02/26 0421) SpO2:  [95 %-97 %] 95 % (02/26 0421) Arterial Line BP: (166-198)/(68-87) 166/70 mmHg (02/25 1400) Last BM Date: 04/23/13  Intake/Output from previous day: 02/25 0701 - 02/26 0700 In: 125 [P.O.:100; IV Piggyback:25] Out: 105 [Drains:105] Intake/Output this shift:    General appearance: alert, cooperative and no distress Resp: clear to auscultation bilaterally Cardio: irregularly irregular rhythm GI: soft, + bowel sounds; minimal incisional tenderness JP drain - serous output; not bilious Incisions c/d/i  Lab Results:   Recent Labs  04/28/13 0500 04/29/13 0246  WBC 19.3* 14.8*  HGB 12.4* 12.0*  HCT 36.5* 35.2*  PLT 160 188   BMET  Recent Labs  04/28/13 0500 04/29/13 0246  NA 145 144  K 4.6 4.4  CL 111 111  CO2 20 20  GLUCOSE 132* 117*  BUN 37* 41*  CREATININE 1.69* 1.80*  CALCIUM 8.2* 8.3*   PT/INR  Recent Labs  04/27/13 0440 04/29/13 0246  LABPROT 16.4* 16.3*  INR 1.36 1.34   ABG No results found for this basename: PHART, PCO2, PO2, HCO3,  in the last 72 hours  Studies/Results: Dg Cholangiogram Operative  04/27/2013   CLINICAL DATA:  Cholecystitis  EXAM: INTRAOPERATIVE CHOLANGIOGRAM  TECHNIQUE: Cholangiographic images from the C-arm fluoroscopic device were submitted for interpretation post-operatively. Please see the procedural report for the amount of contrast and the fluoroscopy time utilized.  COMPARISON:  None.  FINDINGS: No persistent filling defects in the common duct. There is a small diverticulum of the distal CBD. Intrahepatic ducts are  incompletely visualized, appearing decompressed centrally. Contrast passes into the duodenum.  : Negative for retained common duct stone.   Electronically Signed   By: Arne Cleveland M.D.   On: 04/27/2013 13:27    Anti-infectives: Anti-infectives   Start     Dose/Rate Route Frequency Ordered Stop   04/25/13 1400  piperacillin-tazobactam (ZOSYN) IVPB 3.375 g     3.375 g 12.5 mL/hr over 240 Minutes Intravenous 3 times per day 04/25/13 1344     04/25/13 0600  Ampicillin-Sulbactam (UNASYN) 3 g in sodium chloride 0.9 % 100 mL IVPB  Status:  Discontinued     3 g 100 mL/hr over 60 Minutes Intravenous Every 8 hours 04/24/13 2313 04/25/13 1232   04/24/13 2145  Ampicillin-Sulbactam (UNASYN) 3 g in sodium chloride 0.9 % 100 mL IVPB     3 g 100 mL/hr over 60 Minutes Intravenous  Once 04/24/13 2132 04/24/13 2325      Assessment/Plan: s/p Procedure(s): LAPAROSCOPIC CHOLECYSTECTOMY WITH INTRAOPERATIVE CHOLANGIOGRAM (N/A) LAPAROSCOPIC LYSIS OF ADHESIONS (N/A) Patient is doing quite well from surgical standpoint Will leave drain for at least one more day May get patient out of bed with assistance - PT has been consulted OK to anticoagulate WBC decreasing - would continue abx until WBC normal.   LOS: 5 days    Latarshia Jersey K. 04/29/2013

## 2013-04-29 NOTE — Care Management Note (Unsigned)
    Page 1 of 2   04/30/2013     3:57:53 PM   CARE MANAGEMENT NOTE 04/30/2013  Patient:  Justin Chambers, Justin Chambers   Account Number:  [id number]  Date Initiated:  04/28/2013  Documentation initiated by:  [facility]  Subjective/Objective Assessment:   Admitted with abd pain - 2-24 - post op GB surgery.  Afib post op requiring ICU admission overnight.     Action/Plan:   Anticipated DC Date:  05/01/2013   Anticipated DC Plan:  SKILLED NURSING FACILITY  In-house referral  Clinical Social Worker      DC Planning Services  CM consult      Choice offered to / List presented to:             Status of service:  In process, will continue to follow Medicare Important Message given?   (If response is "NO", the following Medicare IM given date fields will be blank) Date Medicare IM given:   Date Additional Medicare IM given:    Discharge Disposition:    Per UR Regulation:  Reviewed for med. necessity/level of care/duration of stay  If discussed at [place] of Stay Meetings, dates discussed:    Comments:  Contact:  Petitjean,Carl Son [place] Son [phone]  04/30/13 Rashelle Ireland,RN,BSN [phone] [facility] HAS DECLINED ADMISSION AT THIS TIME, PER [facility] NOTES; THEY ARE RECOMMENDING TO PURSUE SNF.  UPDATED CSW.  04/29/13 Derotha Fishbaugh,RN,BSN [phone] P.T. CONSULT PENDING.  O.T. CONSULT DONE TODAY; RECOMMENDATION IS FOR SNF AT DC.  MET WITH PT'S DAUGHTER, VIRGINIA (GIN) TODD, [phone], TO DISCUSS DC PLANS. EXPLAINED THAT THERAPY CONSULTS ARE IN PROCESS CURRENTLY, AND THAT SO FAR,O.T. RECOMMENDING SNF.  MS. TODD STATES PT'S "LADY-FRIEND" WANTED TO TAKE HIM HOME AT DC, BUT FAMILY FEELS THIS IS PROBABLY NOT BEST FOR PT AT THIS TIME, AS HE WILL NEED REHABILITATION, AND ALL OF HER BEDROOMS ARE UPSTAIRS.  THEY DO NOT FEEL SHE IS CAPABLE OF PROVIDING AROUND THE CLOCK CARE FOR HIM, WHICH HE WILL NEED.  DTR FEELS PT WILL NEED SNF AT DC, AND SHE HAS DISCUSSED THIS WITH  HER BROTHER, WHO IS THE POA.  I EXPLAINED THAT P.T. WILL STILL NEED TO SEE PT, AND IN THE MEANTIME, WE CAN HAVE CSW FOLLOW UP WITH FAMILY TO DISCUSS OPTIONS.  NOTED REHAB PA HAS SEEN PT, BUT UNCERTAIN AS TO WHETHER THEY ARE CONSIDERING ADMISSION.  WILL REFER TO CSW FOR FOLLOW UP; AWAIT P.T AND REHAB MD RECOMMENDATIONS.

## 2013-04-29 NOTE — Clinical Social Work Psychosocial (Signed)
Clinical Social Work Department BRIEF PSYCHOSOCIAL ASSESSMENT 04/29/2013  Patient:  Justin Chambers, Justin Chambers     Account Number:  [id number]     Admit date:  04/24/2013  Clinical Social Worker:  Frederico Hamman  Date/Time:  04/29/2013 04:44 AM  Referred by:  Care Management  Date Referred:  04/29/2013 Referred for  SNF Placement   Other Referral:   Interview type:  Family Other interview type:    PSYCHOSOCIAL DATA Living Status:  ALONE Admitted from facility:   Level of care:   Primary support name:  Justin Chambers Primary support relationship to patient:  CHILD, ADULT Degree of support available:   Other family involved is Justin Chambers, Justin Chambers. (POA), Justin Chambers, Justin Chambers and [employer].    CURRENT CONCERNS Current Concerns  Post-Acute Placement   Other Concerns:    SOCIAL WORK ASSESSMENT / PLAN CSW rec'd consult for nurse case manager Justin Chambers regarding patient needing SNF placement. She talked with patient's daughter and family is in agreement with SNF placement per Justin Chambers.    CSW contacted daughter Justin Chambers regarding SNF placement and she reported that Justin Chambers is looking at inpatient rehab for patient. 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 CSW talked with daughter regarding SNF as backup and she indicated that family would be agreeable to that. When asked about talking with her brother Justin Chambers., she indicated that they are all on the same page and a call to him would not be needed at this time.   Assessment/plan status:  Psychosocial Support/Ongoing Assessment of Needs Other assessment/ plan:   Information/referral to community resources:   None needed or requested at this time    PATIENT'S/FAMILY'S RESPONSE TO PLAN OF CARE: Patient's daughter Justin Chambers open to speaking with CSW about discharge planning. Family is very hopeful that patient will be accepted with CIR.

## 2013-04-29 NOTE — Progress Notes (Signed)
Subjective: Dr. Sharren Chambers was seen early this morning awakened seemed brighter more lucid and conversant. Confused regarding the date but certainly remembered that he was in the hospital and quickly recognized me.  Objective: Vital signs in last 24 hours: Temp:  [98 F (36.7 C)-98.6 F (37 C)] 98.6 F (37 C) (02/26 0421) Pulse Rate:  [74-90] 90 (02/26 0421) Resp:  [18-22] 18 (02/26 0421) BP: (138-176)/(68-110) 176/73 mmHg (02/26 0421) SpO2:  [95 %-97 %] 95 % (02/26 0421) Arterial Line BP: (166-192)/(70-87) 166/70 mmHg (02/25 1400) Weight change:   CBG (last 3)   Recent Labs  04/27/13 1623  GLUCAP 123*    Intake/Output from previous day: 02/25 0701 - 02/26 0700 In: 125 [P.O.:100; IV Piggyback:25] Out: 105 [Drains:105]  Physical Exam:  Alert awake in no distress good facial symmetry  No JVD or bruits  Lungs are clear  Heart a vascular irregular rate in the 95G 2/6 systolic ejection murmur  Abdomen is soft minimal tenderness  Extremities without edema intact pulses  Neurologically moves extremities x4 converses confusion regarding circumstances      Lab Results:  Recent Labs  04/28/13 0500 04/29/13 0246  NA 145 144  K 4.6 4.4  CL 111 111  CO2 20 20  GLUCOSE 132* 117*  BUN 37* 41*  CREATININE 1.69* 1.80*  CALCIUM 8.2* 8.3*    Recent Labs  04/28/13 0500 04/29/13 0246  AST 27 28  ALT 16 18  ALKPHOS 72 107  BILITOT 0.6 0.7  PROT 5.6* 5.4*  ALBUMIN 2.1* 1.8*    Recent Labs  04/28/13 0500 04/29/13 0246  WBC 19.3* 14.8*  HGB 12.4* 12.0*  HCT 36.5* 35.2*  MCV 81.8 81.3  PLT 160 188   Lab Results  Component Value Date   INR 1.34 04/29/2013   INR 1.36 04/27/2013   INR 2.43* 04/26/2013   No results found for this basename: CKTOTAL, CKMB, CKMBINDEX, TROPONINI,  in the last 72 hours No results found for this basename: TSH, T4TOTAL, FREET3, T3FREE, THYROIDAB,  in the last 72 hours No results found for this basename: VITAMINB12, FOLATE, FERRITIN, TIBC,  IRON, RETICCTPCT,  in the last 72 hours  Studies/Results: Dg Cholangiogram Operative  04/27/2013   CLINICAL DATA:  Cholecystitis  EXAM: INTRAOPERATIVE CHOLANGIOGRAM  TECHNIQUE: Cholangiographic images from the C-arm fluoroscopic device were submitted for interpretation post-operatively. Please see the procedural report for the amount of contrast and the fluoroscopy time utilized.  COMPARISON:  None.  FINDINGS: No persistent filling defects in the common duct. There is a small diverticulum of the distal CBD. Intrahepatic ducts are incompletely visualized, appearing decompressed centrally. Contrast passes into the duodenum.  : Negative for retained common duct stone.   Electronically Signed   By: Arne Cleveland M.D.   On: 04/27/2013 13:27     Assessment/Plan:  #1 atrial fibrillation with rapid ventricular response has stabilized his rate 90s and stable hemodynamically at this point  #2 moderate aortic stenosis him name Justin Chambers stable  #3 chronic kidney disease stage III stable at baseline  #4 cholecystitis status post cholecystectomy white count improving #5 probable mild Alzheimer's disease decompensated clearly improved since just today   [facility] consult inpatient rehabilitation clearly is independent prior to this and should be a good rehabilitation candidate   LOS: 5 days   Justin Chambers A 04/29/2013, 10:53 AM

## 2013-04-29 NOTE — Evaluation (Signed)
Physical Therapy Evaluation Patient Details Name: Justin Chambers MRN: [id number] DOB: 1923-07-16 Today's Date: 04/29/2013 Time: 7829-5621 PT Time Calculation (min): 16 min  PT Assessment / Plan / Recommendation History of Present Illness  S/P laporascopic cholecysectomy.  Pt with post operative confusion in the setting of dementia.  Clinical Impression  Patient is s/p surgery listed above resulting in functional limitations due to the deficits listed below (see PT Problem List).  Patient will benefit from skilled PT to increase their independence and safety with mobility to allow discharge to the venue listed below. Pt slightly unsteady and demo decreased balance and decreased safety awareness. Pt lives alone and would benefit from SNF for post acute rehab; unless family can provide 24/7 (A). Will attempt to ambulate with RW next session. (pt does not like the idea of RW but discussed it today with him)      PT Assessment  Patient needs continued PT services    Follow Up Recommendations  SNF;Supervision/Assistance - 24 hour;Other (comment) (unless family can provide 24/7 (A) )    Does the patient have the potential to tolerate intense rehabilitation      Barriers to Discharge Decreased caregiver support lives alone     Equipment Recommendations  Rolling walker with 5" wheels    Recommendations for Other Services     Frequency Min 3X/week    Precautions / Restrictions Precautions Precautions: Fall Precaution Comments: condom cath, drain Restrictions Weight Bearing Restrictions: No   Pertinent Vitals/Pain No c/o pain       Mobility  Bed Mobility Overal bed mobility: Modified Independent Bed Mobility: Sit to Supine General bed mobility comments: HOB flattened and pt able to return to supine with use of handrail  Transfers Overall transfer level: Needs assistance Equipment used: 1 person hand held assist Transfers: Sit to/from Stand Sit to Stand: Min assist;Min  guard General transfer comment: initially min (A) with sit to stand from chair; progressed with practice x5 to min guard; cues for hand placement and sequencing for safety  Ambulation/Gait Ambulation/Gait assistance: Min assist;Min guard Ambulation Distance (Feet): 14 Feet (with fwd and bwd steps in room) Assistive device: 1 person hand held assist Gait Pattern/deviations: Step-through pattern;Decreased stride length;Shuffle;Trunk flexed;Wide base of support;Leaning posteriorly Gait velocity: decreased Gait velocity interpretation: Below normal speed for age/gender General Gait Details: pt unsteady with hand held (A) reaching for objects with other UE for support; leaning posteriorly throughout ambulation; has increased difficulty with directional changes and backward walking; pt demo decreased safety awareness when returning to sitting position and flops into bed; max cues for safety and sequencing     Exercises General Exercises - Lower Extremity Long Arc Quad: AROM;Both;Strengthening;10 reps;Seated Hip Flexion/Marching: AROM;Both;Strengthening;10 reps;Seated   PT Diagnosis: Abnormality of gait  PT Problem List: Decreased activity tolerance;Decreased mobility;Decreased balance;Decreased safety awareness;Decreased knowledge of use of DME;Decreased cognition PT Treatment Interventions: DME instruction;Gait training;Functional mobility training;Therapeutic exercise;Therapeutic activities;Balance training;Neuromuscular re-education;Patient/family education     PT Goals(Current goals can be found in the care plan section) Acute Rehab PT Goals Patient Stated Goal: to just get better PT Goal Formulation: With patient Time For Goal Achievement: 05/13/13 Potential to Achieve Goals: Good  Visit Information  Last PT Received On: 04/29/13 Assistance Needed: +1 History of Present Illness: S/P laporascopic cholecysectomy.  Pt with post operative confusion in the setting of dementia.       Prior  [place] expects to be discharged to:: Private residence Living Arrangements: Alone Available Help at Discharge: Friend(s);Available  PRN/intermittently Type of Home: House Home Access: Level entry Home Layout: Two level;Able to live on main level with bedroom/bathroom Alternate Level Stairs-Number of Steps: 1 flight Home Equipment: None Additional Comments: Pt is a poor historian, confused. Prior Function Level of Independence: Independent Comments: pt unable to provide clear picture of PLOF/ home setup due to confusion  Communication Communication: HOH Dominant Hand: Right    Cognition  Cognition Arousal/Alertness: Awake/alert Behavior During Therapy: Impulsive Overall Cognitive Status: No family/caregiver present to determine baseline cognitive functioning Area of Impairment: Memory;Attention;Safety/judgement;Following commands;Problem solving Current Attention Level: Sustained Memory: Decreased short-term memory Following Commands: Follows one step commands with increased time Safety/Judgement: Decreased awareness of safety;Decreased awareness of deficits Awareness: Intellectual Problem Solving: Difficulty sequencing;Requires verbal cues;Requires tactile cues General Comments: pt reported that he felt he could walk independently; however pt very unsteady with gt     Extremity/Trunk Assessment Upper Extremity Assessment Upper Extremity Assessment: Defer to OT evaluation Lower Extremity Assessment Lower Extremity Assessment: Overall WFL for tasks assessed Cervical / Trunk Assessment Cervical / Trunk Assessment: Kyphotic   Balance Balance Overall balance assessment: Needs assistance Sitting-balance support: Feet supported;Single extremity supported Sitting balance-Leahy Scale: Good Sitting balance - Comments: leaning posteriorly at times  Postural control: Posterior lean Standing balance support: During functional activity;Single extremity  supported Standing balance-Leahy Scale: Poor Standing balance comment: +posterior sway; hand held (A) to balance   End of Session PT - End of Session Equipment Utilized During Treatment: Gait belt Activity Tolerance: Patient tolerated treatment well Patient left: in bed;with bed alarm set;with call bell/phone within reach Nurse Communication: Mobility status  GP     Gustavus Bryant, [place] 04/29/2013, 5:19 PM

## 2013-04-30 LAB — CBC
HCT: 34.2 % — ABNORMAL LOW (ref 39.0–52.0)
Hemoglobin: 11.7 g/dL — ABNORMAL LOW (ref 13.0–17.0)
MCH: 27.8 pg (ref 26.0–34.0)
MCHC: 34.2 g/dL (ref 30.0–36.0)
MCV: 81.2 fL (ref 78.0–100.0)
PLATELETS: 177 10*3/uL (ref 150–400)
RBC: 4.21 MIL/uL — ABNORMAL LOW (ref 4.22–5.81)
RDW: 15.2 % (ref 11.5–15.5)
WBC: 10.7 10*3/uL — AB (ref 4.0–10.5)

## 2013-04-30 LAB — COMPREHENSIVE METABOLIC PANEL
ALK PHOS: 112 U/L (ref 39–117)
ALT: 16 U/L (ref 0–53)
AST: 21 U/L (ref 0–37)
Albumin: 1.8 g/dL — ABNORMAL LOW (ref 3.5–5.2)
BILIRUBIN TOTAL: 0.8 mg/dL (ref 0.3–1.2)
BUN: 35 mg/dL — AB (ref 6–23)
CHLORIDE: 109 meq/L (ref 96–112)
CO2: 22 mEq/L (ref 19–32)
Calcium: 8.3 mg/dL — ABNORMAL LOW (ref 8.4–10.5)
Creatinine, Ser: 1.66 mg/dL — ABNORMAL HIGH (ref 0.50–1.35)
GFR, EST AFRICAN AMERICAN: 41 mL/min — AB (ref >90)
GFR, EST NON AFRICAN AMERICAN: 35 mL/min — AB (ref >90)
GLUCOSE: 113 mg/dL — AB (ref 70–99)
Potassium: 4.1 mEq/L (ref 3.7–5.3)
Sodium: 143 mEq/L (ref 137–147)
Total Protein: 5.2 g/dL — ABNORMAL LOW (ref 6.0–8.3)

## 2013-04-30 LAB — PROTIME-INR
INR: 1.68 — AB (ref 0.00–1.49)
Prothrombin Time: 19.3 seconds — ABNORMAL HIGH (ref 11.6–15.2)

## 2013-04-30 MED ORDER — WARFARIN SODIUM 3 MG PO TABS
3.0000 mg | ORAL_TABLET | Freq: Once | ORAL | Status: AC
  Administered 2013-04-30: 3 mg via ORAL
  Filled 2013-04-30: qty 1

## 2013-04-30 MED ORDER — BISACODYL 10 MG RE SUPP
10.0000 mg | Freq: Once | RECTAL | Status: AC
  Administered 2013-04-30: 10 mg via RECTAL
  Filled 2013-04-30: qty 1

## 2013-04-30 NOTE — Progress Notes (Signed)
Evening medications given. Patient has been eating very little food today. Requested a lighter option for dinner for patient but patient's appetite still remains poor today. " I don't feel hungry." Will continue to monitor.Cindee Salt

## 2013-04-30 NOTE — Progress Notes (Signed)
Subjective: Awake, bright, reading paper, conversing  Objective: Vital signs in last 24 hours: Temp:  [97.8 F (36.6 C)-99.7 F (37.6 C)] 99.7 F (37.6 C) (02/27 0504) Pulse Rate:  [70-75] 71 (02/27 0504) Resp:  [18-20] 20 (02/27 0504) BP: (151-164)/(64-86) 152/86 mmHg (02/27 0504) SpO2:  [97 %-99 %] 97 % (02/27 0504) Weight change:   CBG (last 3)   Recent Labs  04/27/13 1623  GLUCAP 123*    Intake/Output from previous day: 02/26 0701 - 02/27 0700 In: 680 [P.O.:680] Out: 1545 [Urine:1475; Drains:70]  Physical Exam:  Alert awake in no distress good facial symmetry  No JVD or bruits  Lungs are clear  Heart a vascular irregular rate in the 64P 2/6 systolic ejection murmur  Abdomen is soft minimal tenderness  Extremities without edema intact pulses  Neurologically moves extremities x4 converses and mentally clear     Lab Results:  Recent Labs  04/29/13 0246 04/30/13 0438  NA 144 143  K 4.4 4.1  CL 111 109  CO2 20 22  GLUCOSE 117* 113*  BUN 41* 35*  CREATININE 1.80* 1.66*  CALCIUM 8.3* 8.3*    Recent Labs  04/29/13 0246 04/30/13 0438  AST 28 21  ALT 18 16  ALKPHOS 107 112  BILITOT 0.7 0.8  PROT 5.4* 5.2*  ALBUMIN 1.8* 1.8*    Recent Labs  04/29/13 0246 04/30/13 0438  WBC 14.8* 10.7*  HGB 12.0* 11.7*  HCT 35.2* 34.2*  MCV 81.3 81.2  PLT 188 177   Lab Results  Component Value Date   INR 1.68* 04/30/2013   INR 1.34 04/29/2013   INR 1.36 04/27/2013   No results found for this basename: CKTOTAL, CKMB, CKMBINDEX, TROPONINI,  in the last 72 hours No results found for this basename: TSH, T4TOTAL, FREET3, T3FREE, THYROIDAB,  in the last 72 hours No results found for this basename: VITAMINB12, FOLATE, FERRITIN, TIBC, IRON, RETICCTPCT,  in the last 72 hours  Studies/Results: No results found.   Assessment/Plan:  #1 atrial fibrillation with rapid ventricular response has stabilized his rate 90s and stable hemodynamically at this point  #2  moderate aortic stenosis him name [name] stable  #3 chronic kidney disease stage III stable at baseline  #4 cholecystitis status post cholecystectomy white count improving  #5 probable mild Alzheimer's disease decompensated clearly improved since just today   Hopefully drain out tomorrow, d/c abx/ mobilize--home or cir 1-2d   LOS: 6 days   Kippy Melena A 04/30/2013, 8:44 AM

## 2013-04-30 NOTE — Progress Notes (Signed)
3 Days Post-Op  Subjective: Patient resting comfortably; only complains of some "slight discomfort" Has been evaluated by rehab  Objective: Vital signs in last 24 hours: Temp:  [97.8 F (36.6 C)-99.7 F (37.6 C)] 99.7 F (37.6 C) (02/27 0504) Pulse Rate:  [70-75] 71 (02/27 0504) Resp:  [18-20] 20 (02/27 0504) BP: (151-164)/(64-86) 152/86 mmHg (02/27 0504) SpO2:  [97 %-99 %] 97 % (02/27 0504) Last BM Date: 04/23/13  Intake/Output from previous day: 02/26 0701 - 02/27 0700 In: 680 [P.O.:680] Out: 1545 [Urine:1475; Drains:70] Intake/Output this shift:    General appearance: alert, cooperative and no distress GI: soft, minimal tenderness; incision c/d/i Drain - serosanguinous output  Lab Results:   Recent Labs  04/29/13 0246 04/30/13 0438  WBC 14.8* 10.7*  HGB 12.0* 11.7*  HCT 35.2* 34.2*  PLT 188 177   BMET  Recent Labs  04/29/13 0246 04/30/13 0438  NA 144 143  K 4.4 4.1  CL 111 109  CO2 20 22  GLUCOSE 117* 113*  BUN 41* 35*  CREATININE 1.80* 1.66*  CALCIUM 8.3* 8.3*   Hepatic Function Panel     Component Value Date/Time   PROT 5.2* 04/30/2013 0438   ALBUMIN 1.8* 04/30/2013 0438   AST 21 04/30/2013 0438   ALT 16 04/30/2013 0438   ALKPHOS 112 04/30/2013 0438   BILITOT 0.8 04/30/2013 0438      PT/INR  Recent Labs  04/29/13 0246 04/30/13 0438  LABPROT 16.3* 19.3*  INR 1.34 1.68*   ABG No results found for this basename: PHART, PCO2, PO2, HCO3,  in the last 72 hours  Studies/Results: No results found.  Anti-infectives: Anti-infectives   Start     Dose/Rate Route Frequency Ordered Stop   04/25/13 1400  piperacillin-tazobactam (ZOSYN) IVPB 3.375 g     3.375 g 12.5 mL/hr over 240 Minutes Intravenous 3 times per day 04/25/13 1344     04/25/13 0600  Ampicillin-Sulbactam (UNASYN) 3 g in sodium chloride 0.9 % 100 mL IVPB  Status:  Discontinued     3 g 100 mL/hr over 60 Minutes Intravenous Every 8 hours 04/24/13 2313 04/25/13 1232   04/24/13 2145   Ampicillin-Sulbactam (UNASYN) 3 g in sodium chloride 0.9 % 100 mL IVPB     3 g 100 mL/hr over 60 Minutes Intravenous  Once 04/24/13 2132 04/24/13 2325      Assessment/Plan: s/p Procedure(s): LAPAROSCOPIC CHOLECYSTECTOMY WITH INTRAOPERATIVE CHOLANGIOGRAM (N/A) LAPAROSCOPIC LYSIS OF ADHESIONS (N/A) Patient seems to be doing quite well after lap chole for gangrenous cholecystitis. Will leave the drain for one more day, as there still seems to be considerable serosanguinous output. Dulcolax suppository - the patient has not had a BM since surgery. Up with PT  LOS: 6 days    Judaea Burgoon K. 04/30/2013

## 2013-04-30 NOTE — Progress Notes (Signed)
ANTICOAGULATION CONSULT NOTE - Initial Consult  Pharmacy Consult for warfarin Indication: atrial fibrillation  Allergies  Allergen Reactions  . Cephalosporins     Unknown, previously documented in [place], pt does not recall reactions  . Latex    Patient Measurements: Height: 5\' 11"  (180.3 cm) Weight: 161 lb 13.1 oz (73.4 kg) IBW/kg (Calculated) : 75.3  Vital Signs: Temp: 99.7 F (37.6 C) (02/27 0504) Temp src: Oral (02/27 0504) BP: 148/52 mmHg (02/27 1058) Pulse Rate: 75 (02/27 1058)   Recent Labs  04/28/13 0500 04/29/13 0246 04/30/13 0438  HGB 12.4* 12.0* 11.7*  HCT 36.5* 35.2* 34.2*  PLT 160 188 177  LABPROT  --  16.3* 19.3*  INR  --  1.34 1.68*  CREATININE 1.69* 1.80* 1.66*   Estimated Creatinine Clearance: 32.5 ml/min (by C-G formula based on Cr of 1.66).  Medical History: Past Medical History  Diagnosis Date  . Atrial fibrillation   . Dementia   . Chronic kidney disease    Medications:  Prescriptions prior to admission  Medication Sig Dispense Refill  . chlorhexidine (PERIDEX) 0.12 % solution Use as directed 15 mLs in the mouth or throat daily.      . digoxin (LANOXIN) 0.125 MG tablet Take 0.125 mg by mouth daily.      Marland Kitchen OVER THE COUNTER MEDICATION Take 1 tablet by mouth daily. Vitamin      . warfarin (COUMADIN) 2 MG tablet Take 1-2 mg by mouth daily. 1 mg (half tablet) on Monday, Wednesday, and Friday  2 mg (full tablet) on Tuesday and Thursday        Assessment: 89 yom on chronic coumadin for afib presented to the hospital with epigastric pain. INR was reversed with vitamin k and pt is now s/p cholecystectomy. INR today subtherapeutic at 1.68 . Pt's dose prior to admission is 1 mg po on MWF, 2 mg po on all other days.  H/H 11.7/34.2, plts 177, no bleeding noted. Coumadin was restarted 2/26. Last coumadin dose PTA on 2/21.   Goal of Therapy:  INR 2-3   Plan:  1. Warfarin 3mg  po x 1 tonight 2. Daily INR  Hughes Better, PharmD, BCPS Clinical  Pharmacist Pager: [phone] 04/30/2013 1:32 PM

## 2013-04-30 NOTE — Progress Notes (Signed)
Physical Therapy Treatment Patient Details Name: Justin Chambers MRN: [id number] DOB: 1923/07/19 Today's Date: 04/30/2013 Time: 3976-7341 PT Time Calculation (min): 10 min  PT Assessment / Plan / Recommendation  History of Present Illness S/P laporascopic cholecysectomy.  Pt with post operative confusion in the setting of dementia.   PT Comments   Pt making great progress toward goals.  Follow Up Recommendations  SNF;Supervision/Assistance - 24 hour;Other (comment) (unless family can provide 24 hour assistance)     Equipment Recommendations  Rolling walker with 5" wheels    Frequency Min 3X/week   Progress towards PT Goals Progress towards PT goals: Progressing toward goals  Plan Current plan remains appropriate    Precautions / Restrictions Precautions Precautions: Fall Precaution Comments: condom cath, drain Restrictions Weight Bearing Restrictions: No       Mobility  Bed Mobility Overal bed mobility: Needs Assistance Supine to sit: Supervision General bed mobility comments: supervision to sit up to edge of bed with use of rail Transfers Overall transfer level: Needs assistance Equipment used: Rolling walker (2 wheeled) Transfers: Sit to/from Stand Sit to Stand: Min assist General transfer comment: cues for hand placement with standing and sitting and to use arms to control descent with sitting. Ambulation/Gait Ambulation/Gait assistance: Min guard Ambulation Distance (Feet): 30 Feet Assistive device: Rolling walker (2 wheeled) Gait Pattern/deviations: Step-through pattern;Decreased stride length;Trunk flexed;Narrow base of support Gait velocity: decreased Gait velocity interpretation: Below normal speed for age/gender General Gait Details: improved steadiness with walker. needed cues on posture and to stay with walker during gait (not push walker too far out).       PT Goals (current goals can now be found in the care plan section) Acute Rehab PT Goals Patient  Stated Goal: to just get better PT Goal Formulation: With patient Time For Goal Achievement: 05/13/13 Potential to Achieve Goals: Good  Visit Information  Last PT Received On: 04/30/13 Assistance Needed: +1 History of Present Illness: S/P laporascopic cholecysectomy.  Pt with post operative confusion in the setting of dementia.    Subjective Data  Patient Stated Goal: to just get better   Cognition  Cognition Arousal/Alertness: Awake/alert Behavior During Therapy: WFL for tasks assessed/performed Overall Cognitive Status: No family/caregiver present to determine baseline cognitive functioning Area of Impairment: Memory;Attention;Awareness;Problem solving Current Attention Level: Sustained Memory: Decreased short-term memory Following Commands: Follows one step commands consistently Safety/Judgement: Decreased awareness of safety;Decreased awareness of deficits Awareness: Intellectual Problem Solving: Requires verbal cues;Requires tactile cues       End of Session PT - End of Session Equipment Utilized During Treatment: Gait belt Activity Tolerance: Patient tolerated treatment well Patient left: with call bell/phone within reach;Other (comment) Nurse Communication: Mobility status   GP     Willow Ora 04/30/2013, 1:54 PM  Willow Ora, PTA Office- [phone]

## 2013-04-30 NOTE — Discharge Instructions (Addendum)
Information on my medicine - Coumadin   (Warfarin)  Why was Coumadin prescribed for you? Coumadin was prescribed for you because you have a blood clot or a medical condition that can cause an increased risk of forming blood clots. Blood clots can cause serious health problems by blocking the flow of blood to the heart, lung, or brain. Coumadin can prevent harmful blood clots from forming. As a reminder your indication for Coumadin is:   Stroke Prevention Because Of Atrial Fibrillation  What test will check on my response to Coumadin? While on Coumadin (warfarin) you will need to have an INR test regularly to ensure that your dose is keeping you in the desired range. The INR (international normalized ratio) number is calculated from the result of the laboratory test called prothrombin time (PT).  If an INR APPOINTMENT HAS NOT ALREADY BEEN MADE FOR YOU please schedule an appointment to have this lab work done by your health care provider within 7 days. Your INR goal is usually a number between:  2 to 3 or your provider may give you a more narrow range like 2-2.5.  Ask your health care provider during an office visit what your goal INR is.  What  do you need to  know  About  COUMADIN? Take Coumadin (warfarin) exactly as prescribed by your healthcare provider about the same time each day.  DO NOT stop taking without talking to the doctor who prescribed the medication.  Stopping without other blood clot prevention medication to take the place of Coumadin may increase your risk of developing a new clot or stroke.  Get refills before you run out.  What do you do if you miss a dose? If you miss a dose, take it as soon as you remember on the same day then continue your regularly scheduled regimen the next day.  Do not take two doses of Coumadin at the same time.  Important Safety Information A possible side effect of Coumadin (Warfarin) is an increased risk of bleeding. You should call your healthcare  provider right away if you experience any of the following:   Bleeding from an injury or your nose that does not stop.   Unusual colored urine (red or dark brown) or unusual colored stools (red or black).   Unusual bruising for unknown reasons.   A serious fall or if you hit your head (even if there is no bleeding).  Some foods or medicines interact with Coumadin (warfarin) and might alter your response to warfarin. To help avoid this:   Eat a balanced diet, maintaining a consistent amount of Vitamin K.   Notify your provider about major diet changes you plan to make.   Avoid alcohol or limit your intake to 1 drink for women and 2 drinks for men per day. (1 drink is 5 oz. wine, 12 oz. beer, or 1.5 oz. liquor.)  Make sure that ANY health care provider who prescribes medication for you knows that you are taking Coumadin (warfarin).  Also make sure the healthcare provider who is monitoring your Coumadin knows when you have started a new medication including herbals and non-prescription products.  Coumadin (Warfarin)  Major Drug Interactions  Increased Warfarin Effect Decreased Warfarin Effect  Alcohol (large quantities) Antibiotics (esp. Septra/Bactrim, Flagyl, Cipro) Amiodarone (Cordarone) Aspirin (ASA) Cimetidine (Tagamet) Megestrol (Megace) NSAIDs (ibuprofen, naproxen, etc.) Piroxicam (Feldene) Propafenone (Rythmol SR) Propranolol (Inderal) Isoniazid (INH) Posaconazole (Noxafil) Barbiturates (Phenobarbital) Carbamazepine (Tegretol) Chlordiazepoxide (Librium) Cholestyramine (Questran) Griseofulvin Oral Contraceptives Rifampin Sucralfate (Carafate) Vitamin  K   Coumadin (Warfarin) Major Herbal Interactions  Increased Warfarin Effect Decreased Warfarin Effect  Garlic Ginseng Ginkgo biloba Coenzyme Q10 Green tea St. Johns wort    Coumadin (Warfarin) FOOD Interactions  Eat a consistent number of servings per week of foods HIGH in Vitamin K (1 serving =  cup)  Collards  (cooked, or boiled & drained) Kale (cooked, or boiled & drained) Mustard greens (cooked, or boiled & drained) Parsley *serving size only =  cup Spinach (cooked, or boiled & drained) Swiss chard (cooked, or boiled & drained) Turnip greens (cooked, or boiled & drained)  Eat a consistent number of servings per week of foods MEDIUM-HIGH in Vitamin K (1 serving = 1 cup)  Asparagus (cooked, or boiled & drained) Broccoli (cooked, boiled & drained, or raw & chopped) Brussel sprouts (cooked, or boiled & drained) *serving size only =  cup Lettuce, raw (green leaf, endive, romaine) Spinach, raw Turnip greens, raw & chopped   These websites have more information on Coumadin (warfarin):  FailFactory.se; VeganReport.com.au;           [facility] LAPAROSCOPIC SURGERY: POST OP INSTRUCTIONS Always review your discharge instruction sheet given to you by the facility where your surgery was performed. IF YOU HAVE DISABILITY OR FAMILY LEAVE FORMS, YOU MUST BRING THEM TO THE OFFICE FOR PROCESSING.   DO NOT GIVE THEM TO YOUR DOCTOR.  1. A prescription for pain medication will be given to you upon discharge.  Take your pain medication as prescribed, if needed.  If narcotic pain medicine is not needed, then you may take acetaminophen (Tylenol) or ibuprofen (Advil) as needed. 2. Take your usually prescribed medications unless otherwise directed. 3. If you need a refill on your pain medication, please contact your pharmacy.  They will contact our office to request authorization. Prescriptions will not be filled after 5pm or on week-ends. 4. You should follow a light diet the first few days after arrival home, such as soup and crackers, etc.  Be sure to include lots of fluids daily. 5. Most patients will experience some swelling and bruising in the area of the incisions.  Ice packs will help.  Swelling and bruising can take several days to resolve.  6. It is common to  experience some constipation if taking pain medication after surgery.  Increasing fluid intake and taking a stool softener (such as Colace) will usually help or prevent this problem from occurring.  A mild laxative (Milk of Magnesia or Miralax) should be taken according to package instructions if there are no bowel movements after 48 hours. 7. Unless discharge instructions indicate otherwise, you may remove your bandages 48 hours after surgery, and you may shower at that time.  You will have steri-strips (small skin tapes) in place directly over the incision.  These strips should be left on the skin for 7-10 days.  If your surgeon used skin glue on the incision, you may shower in 24 hours.  The glue will flake off over the next 2-3 weeks.  Any sutures or staples will be removed at the office during your follow-up visit. 8. ACTIVITIES:  You may resume regular (light) daily activities beginning the next day--such as daily self-care, walking, climbing stairs--gradually increasing activities as tolerated.  You may have sexual intercourse when it is comfortable.  Refrain from any heavy lifting or straining until approved by your doctor. a. You may drive when you are no longer taking prescription pain medication, you can comfortably wear a seatbelt,  and you can safely maneuver your car and apply brakes. b. RETURN TO WORK:   2-3 weeks 9. You should see your doctor in the office for a follow-up appointment approximately 2-3 weeks after your surgery.  Make sure that you call for this appointment within a day or two after you arrive home to insure a convenient appointment time. 10. OTHER INSTRUCTIONS: ________________________________________________________________________ WHEN TO CALL YOUR DOCTOR: 1. Fever over 101.0 2. Inability to urinate 3. Continued bleeding from incision. 4. Increased pain, redness, or drainage from the incision. 5. Increasing abdominal pain  The clinic staff is available to answer your  questions during regular business hours.  Please dont hesitate to call and ask to speak to one of the nurses for clinical concerns.  If you have a medical emergency, go to the nearest emergency room or call 911.  A surgeon from [facility] is always on call at the hospital. [address] [place], [place], [place]  [address] ? [address], [place], [place]   [address] [phone] ? [phone] ? FAX [phone] Web site: [email or web address]

## 2013-04-30 NOTE — Progress Notes (Signed)
Rehab admissions - Noted from chart review that both PT/OT are recommending skilled nursing and that 24-7 care is limited at home. Also noted that dtr may be interested in pursuing Assisted Living for pt. We have limited bed availability and may recommend that skilled nursing to be pursued.   Please call me with any questions. Thanks.   Nanetta Batty, PT Rehabilitation Admissions Coordinator [phone]

## 2013-05-01 LAB — CBC
HCT: 34.8 % — ABNORMAL LOW (ref 39.0–52.0)
Hemoglobin: 11.8 g/dL — ABNORMAL LOW (ref 13.0–17.0)
MCH: 27.2 pg (ref 26.0–34.0)
MCHC: 33.9 g/dL (ref 30.0–36.0)
MCV: 80.2 fL (ref 78.0–100.0)
Platelets: 198 10*3/uL (ref 150–400)
RBC: 4.34 MIL/uL (ref 4.22–5.81)
RDW: 14.8 % (ref 11.5–15.5)
WBC: 10.6 10*3/uL — ABNORMAL HIGH (ref 4.0–10.5)

## 2013-05-01 LAB — COMPREHENSIVE METABOLIC PANEL
ALT: 14 U/L (ref 0–53)
AST: 19 U/L (ref 0–37)
Albumin: 1.8 g/dL — ABNORMAL LOW (ref 3.5–5.2)
Alkaline Phosphatase: 107 U/L (ref 39–117)
BUN: 29 mg/dL — ABNORMAL HIGH (ref 6–23)
CO2: 23 mEq/L (ref 19–32)
Calcium: 8 mg/dL — ABNORMAL LOW (ref 8.4–10.5)
Chloride: 106 mEq/L (ref 96–112)
Creatinine, Ser: 1.56 mg/dL — ABNORMAL HIGH (ref 0.50–1.35)
GFR calc non Af Amer: 38 mL/min — ABNORMAL LOW (ref >90)
GFR, EST AFRICAN AMERICAN: 44 mL/min — AB (ref >90)
GLUCOSE: 119 mg/dL — AB (ref 70–99)
Potassium: 3.8 mEq/L (ref 3.7–5.3)
Sodium: 142 mEq/L (ref 137–147)
TOTAL PROTEIN: 5.3 g/dL — AB (ref 6.0–8.3)
Total Bilirubin: 0.6 mg/dL (ref 0.3–1.2)

## 2013-05-01 LAB — PROTIME-INR
INR: 2.32 — ABNORMAL HIGH (ref 0.00–1.49)
Prothrombin Time: 24.7 seconds — ABNORMAL HIGH (ref 11.6–15.2)

## 2013-05-01 MED ORDER — WARFARIN 0.5 MG HALF TABLET
0.5000 mg | ORAL_TABLET | Freq: Once | ORAL | Status: AC
Start: 2013-05-01 — End: 2013-05-01
  Administered 2013-05-01: 0.5 mg via ORAL
  Filled 2013-05-01: qty 1

## 2013-05-01 NOTE — Progress Notes (Signed)
Subjective: He doesn't remember surgery coming by.   Turned down by inpt rehab.   Search for snf for short term rehab ongoing. He is agreeable to this even though he wants to go home.  No pain.   He is eating.  Objective: Vital signs in last 24 hours: Temp:  [97.8 F (36.6 C)-98.6 F (37 C)] 98.6 F (37 C) (02/28 0448) Pulse Rate:  [63-78] 63 (02/28 0448) Resp:  [18-19] 19 (02/28 0448) BP: (141-169)/(70-81) 150/75 mmHg (02/28 1049) SpO2:  [96 %-97 %] 97 % (02/28 0448) Weight change:  Last BM Date: 04/30/13  Intake/Output from previous day: 02/27 0701 - 02/28 0700 In: 463 [P.O.:460; I.V.:3] Out: 1541 [Urine:1500; Drains:40; Stool:1] Intake/Output this shift: Total I/O In: 120 [P.O.:120] Out: 86 [Drains:45]  General appearance: alert, cooperative and appears stated age Resp: clear to auscultation bilaterally Cardio: irreg, irreg. No sig m. GI: soft, non-tender; bowel sounds normal; no masses,  no organomegaly Extremities: extremities normal, atraumatic, no cyanosis or edema Neurologic: Grossly normal   Lab Results:  Recent Labs  04/30/13 0438 05/01/13 0310  WBC 10.7* 10.6*  HGB 11.7* 11.8*  HCT 34.2* 34.8*  PLT 177 198   BMET  Recent Labs  04/30/13 0438 05/01/13 0310  NA 143 142  K 4.1 3.8  CL 109 106  CO2 22 23  GLUCOSE 113* 119*  BUN 35* 29*  CREATININE 1.66* 1.56*  CALCIUM 8.3* 8.0*   CMET CMP     Component Value Date/Time   NA 142 05/01/2013 0310   K 3.8 05/01/2013 0310   CL 106 05/01/2013 0310   CO2 23 05/01/2013 0310   GLUCOSE 119* 05/01/2013 0310   BUN 29* 05/01/2013 0310   CREATININE 1.56* 05/01/2013 0310   CALCIUM 8.0* 05/01/2013 0310   PROT 5.3* 05/01/2013 0310   ALBUMIN 1.8* 05/01/2013 0310   AST 19 05/01/2013 0310   ALT 14 05/01/2013 0310   ALKPHOS 107 05/01/2013 0310   BILITOT 0.6 05/01/2013 0310   GFRNONAA 38* 05/01/2013 0310   GFRAA 44* 05/01/2013 0310     Studies/Results: No results found.  Medications: I have reviewed the patient's  current medications.  . metoprolol tartrate  12.5 mg Oral BID  . sodium chloride  3 mL Intravenous Q12H  . warfarin  0.5 mg Oral ONCE-1800  . Warfarin - Pharmacist Dosing Inpatient   Does not apply q1800    Lab Results  Component Value Date   INR 2.32* 05/01/2013   INR 1.68* 04/30/2013   INR 1.34 04/29/2013     Assessment/Plan:  Principal Problem:   Cholecystitis-s/p chole.  Drain out. Off abx. Ready for rehab stay. To snf probably Monday since no disposition today. Active Problems:   Chronic kidney disease (CKD) stage G3a/A3, moderately decreased glomerular filtration rate (GFR) between 45-59 mL/min/1.73 square meter and albuminuria creatinine ratio greater than 300 mg/g-stable   Atrial fibrillation-rate okay, inr therapeutic. Per pharm   Alzheimer disease-pleasantly dementd. Cont. PT. To snf.   LOS: 7 days   Jerlyn Ly, MD 05/01/2013, 12:24 PM

## 2013-05-01 NOTE — Progress Notes (Signed)
4 Days Post-Op  Subjective: Sleepy but arousable One BM yesterday Minimal drain output WBC normalized Hgb stable   Objective: Vital signs in last 24 hours: Temp:  [97.8 F (36.6 C)-98.6 F (37 C)] 98.6 F (37 C) (02/28 0448) Pulse Rate:  [63-78] 63 (02/28 0448) Resp:  [18-19] 19 (02/28 0448) BP: (141-169)/(52-81) 169/81 mmHg (02/28 0448) SpO2:  [96 %-97 %] 97 % (02/28 0448) Last BM Date: 04/30/13  Intake/Output from previous day: 02/27 0701 - 02/28 0700 In: 463 [P.O.:460; I.V.:3] Out: 1541 [Urine:1500; Drains:40; Stool:1] Intake/Output this shift: Total I/O In: -  Out: 45 [Drains:45]  GI: soft, non-tender; bowel sounds normal; no masses,  no organomegaly incisions c/d/i  Lab Results:   Recent Labs  04/30/13 0438 05/01/13 0310  WBC 10.7* 10.6*  HGB 11.7* 11.8*  HCT 34.2* 34.8*  PLT 177 198   BMET  Recent Labs  04/30/13 0438 05/01/13 0310  NA 143 142  K 4.1 3.8  CL 109 106  CO2 22 23  GLUCOSE 113* 119*  BUN 35* 29*  CREATININE 1.66* 1.56*  CALCIUM 8.3* 8.0*   PT/INR  Recent Labs  04/30/13 0438 05/01/13 0310  LABPROT 19.3* 24.7*  INR 1.68* 2.32*   ABG No results found for this basename: PHART, PCO2, PO2, HCO3,  in the last 72 hours  Studies/Results: No results found.  Anti-infectives: Anti-infectives   Start     Dose/Rate Route Frequency Ordered Stop   04/25/13 1400  piperacillin-tazobactam (ZOSYN) IVPB 3.375 g  Status:  Discontinued     3.375 g 12.5 mL/hr over 240 Minutes Intravenous 3 times per day 04/25/13 1344 04/30/13 0844   04/25/13 0600  Ampicillin-Sulbactam (UNASYN) 3 g in sodium chloride 0.9 % 100 mL IVPB  Status:  Discontinued     3 g 100 mL/hr over 60 Minutes Intravenous Every 8 hours 04/24/13 2313 04/25/13 1232   04/24/13 2145  Ampicillin-Sulbactam (UNASYN) 3 g in sodium chloride 0.9 % 100 mL IVPB     3 g 100 mL/hr over 60 Minutes Intravenous  Once 04/24/13 2132 04/24/13 2325      Assessment/Plan: s/p  Procedure(s): LAPAROSCOPIC CHOLECYSTECTOMY WITH INTRAOPERATIVE CHOLANGIOGRAM (N/A) LAPAROSCOPIC LYSIS OF ADHESIONS (N/A) Drain removed. Doing well after cholecystectomy Ready for discharge to SNF/ rehab from surgical standpoint.    LOS: 7 days    Justin Hegel K. 05/01/2013

## 2013-05-01 NOTE — Progress Notes (Addendum)
Clinical Social Worker (CSW) spoke with patient's daughter [name] [phone] who reported that [facility] is their one and only choice since CIR denied patient. CSW spoke with Deirdre Pippins [phone] admissions coordinator at [facility] who repeorted that patient can come to a semi private room and family will have to do paper work at 58 am Sunday. CSW spoke with daughter [name] who is agreeable to a semi private room. Narda Rutherford reported that patient could be put on the waiting list for a private room. Daughter aware. Daughter reported that patient's son is the HPOA and will be doing paper work with Narda Rutherford at 11 am on Sunday.   CSW completed FL2 and put it on shadow chart. Patient will transfer to [facility] on Sunday. CSW will assist with D/C.  Rolinda Roan Weekend CSW [phone]     Patient's son Keyaan Lederman is patient's HPOA and is completing paper work with Deirdre Pippins at [facility] at 2 pm on Sunday. Patient is being transported to [facility] by son Wojciech.

## 2013-05-01 NOTE — Progress Notes (Addendum)
Clinical Social Work Department CLINICAL SOCIAL WORK PLACEMENT NOTE 05/01/2013  Patient:  Justin Chambers, Justin Chambers  Account Number:  [id number] Admit date:  04/24/2013  Clinical Social Worker:  Blima Rich, [place]  Date/time:  05/01/2013 10:03 AM  Clinical Social Work is seeking post-discharge placement for this patient at the following level of care:   [facility]   (*CSW will update this form in Epic as items are completed)   04/30/2013  Patient/family provided with [facility] Department of Clinical Social Work's list of facilities offering this level of care within the geographic area requested by the patient (or if unable, by the patient's family).  04/30/2013  Patient/family informed of their freedom to choose among providers that offer the needed level of care, that participate in Medicare, Medicaid or managed care program needed by the patient, have an available bed and are willing to accept the patient.  04/30/2013  Patient/family informed of [facility]' ownership interest in [facility], as well as of the fact that they are under no obligation to receive care at this facility.  PASARR submitted to EDS on 05/01/2013 PASARR number received from EDS on 05/01/2013  FL2 transmitted to all facilities in geographic area requested by pt/family on  05/01/2013 FL2 transmitted to all facilities within larger geographic area on   Patient informed that his/her managed care company has contracts with or will negotiate with  certain facilities, including the following:     Patient/family informed of bed offers received:   Patient chooses bed at [facility] Physician recommends and patient chooses bed at    Patient to be transferred to [facility] on 05/02/13- Blima Rich, [place]   Patient to be transferred to facility by Lurlean Nanny in personal vehicle- Blima Rich, [facility]   The following physician request were entered in Epic:   Additional Comments:

## 2013-05-01 NOTE — Progress Notes (Signed)
ANTICOAGULATION CONSULT NOTE - Follow Up Consult  Pharmacy Consult for Coumadin Indication: atrial fibrillation  Allergies  Allergen Reactions  . Cephalosporins     Unknown, previously documented in [facility], pt does not recall reactions  . Latex     Patient Measurements: Height: 5\' 11"  (180.3 cm) Weight: 161 lb 13.1 oz (73.4 kg) IBW/kg (Calculated) : 75.3  Vital Signs: Temp: 98.6 F (37 C) (02/28 0448) Temp src: Oral (02/28 0448) BP: 150/75 mmHg (02/28 1049) Pulse Rate: 63 (02/28 0448)  Labs:  Recent Labs  04/29/13 0246 04/30/13 0438 05/01/13 0310  HGB 12.0* 11.7* 11.8*  HCT 35.2* 34.2* 34.8*  PLT 188 177 198  LABPROT 16.3* 19.3* 24.7*  INR 1.34 1.68* 2.32*  CREATININE 1.80* 1.66* 1.56*    Estimated Creatinine Clearance: 34.6 ml/min (by C-G formula based on Cr of 1.56).  Assessment: 89yom on coumadin pta for afib, admitted with epigastric pain. INR therapeutic on admission, but reveresed 2/22 with po vitamin k 10mg  for planned cholecystectomy. Procedure done on 2/24 and coumadin resumed 2/25. INR is at goal today but has jumped with 3mg  doses. He is on a regular diet now but not eating much. CBC stable. No bleeding reported.  Goal of Therapy:  INR 2-3 Monitor platelets by anticoagulation protocol: Yes   Plan:  1) Coumadin 0.5mg  x 1 2) INR in AM  Deboraha Sprang 05/01/2013,12:14 PM

## 2013-05-02 DIAGNOSIS — M6281 Muscle weakness (generalized): Secondary | ICD-10-CM | POA: Diagnosis not present

## 2013-05-02 DIAGNOSIS — R110 Nausea: Secondary | ICD-10-CM | POA: Diagnosis not present

## 2013-05-02 DIAGNOSIS — N183 Chronic kidney disease, stage 3 unspecified: Secondary | ICD-10-CM | POA: Diagnosis not present

## 2013-05-02 DIAGNOSIS — F0280 Dementia in other diseases classified elsewhere without behavioral disturbance: Secondary | ICD-10-CM | POA: Diagnosis not present

## 2013-05-02 DIAGNOSIS — Z4889 Encounter for other specified surgical aftercare: Secondary | ICD-10-CM | POA: Diagnosis not present

## 2013-05-02 DIAGNOSIS — R0789 Other chest pain: Secondary | ICD-10-CM | POA: Diagnosis not present

## 2013-05-02 DIAGNOSIS — R262 Difficulty in walking, not elsewhere classified: Secondary | ICD-10-CM | POA: Diagnosis not present

## 2013-05-02 DIAGNOSIS — I4891 Unspecified atrial fibrillation: Secondary | ICD-10-CM | POA: Diagnosis not present

## 2013-05-02 DIAGNOSIS — R0609 Other forms of dyspnea: Secondary | ICD-10-CM | POA: Diagnosis not present

## 2013-05-02 DIAGNOSIS — R1013 Epigastric pain: Secondary | ICD-10-CM | POA: Diagnosis not present

## 2013-05-02 DIAGNOSIS — R0989 Other specified symptoms and signs involving the circulatory and respiratory systems: Secondary | ICD-10-CM | POA: Diagnosis not present

## 2013-05-02 LAB — CBC
HCT: 34.5 % — ABNORMAL LOW (ref 39.0–52.0)
Hemoglobin: 11.9 g/dL — ABNORMAL LOW (ref 13.0–17.0)
MCH: 27.6 pg (ref 26.0–34.0)
MCHC: 34.5 g/dL (ref 30.0–36.0)
MCV: 80 fL (ref 78.0–100.0)
Platelets: 227 10*3/uL (ref 150–400)
RBC: 4.31 MIL/uL (ref 4.22–5.81)
RDW: 14.7 % (ref 11.5–15.5)
WBC: 11.3 10*3/uL — ABNORMAL HIGH (ref 4.0–10.5)

## 2013-05-02 LAB — COMPREHENSIVE METABOLIC PANEL
ALK PHOS: 100 U/L (ref 39–117)
ALT: 13 U/L (ref 0–53)
AST: 18 U/L (ref 0–37)
Albumin: 1.9 g/dL — ABNORMAL LOW (ref 3.5–5.2)
BUN: 24 mg/dL — ABNORMAL HIGH (ref 6–23)
CO2: 23 meq/L (ref 19–32)
Calcium: 8.3 mg/dL — ABNORMAL LOW (ref 8.4–10.5)
Chloride: 107 mEq/L (ref 96–112)
Creatinine, Ser: 1.42 mg/dL — ABNORMAL HIGH (ref 0.50–1.35)
GFR, EST AFRICAN AMERICAN: 50 mL/min — AB (ref >90)
GFR, EST NON AFRICAN AMERICAN: 43 mL/min — AB (ref >90)
Glucose, Bld: 126 mg/dL — ABNORMAL HIGH (ref 70–99)
Potassium: 3.9 mEq/L (ref 3.7–5.3)
SODIUM: 144 meq/L (ref 137–147)
Total Bilirubin: 0.5 mg/dL (ref 0.3–1.2)
Total Protein: 5.3 g/dL — ABNORMAL LOW (ref 6.0–8.3)

## 2013-05-02 LAB — PROTIME-INR
INR: 2.78 — AB (ref 0.00–1.49)
PROTHROMBIN TIME: 28.4 s — AB (ref 11.6–15.2)

## 2013-05-02 MED ORDER — ACETAMINOPHEN 500 MG PO TABS: 500.0000 mg | ORAL_TABLET | Freq: Four times a day (QID) | ORAL | Status: DC | PRN

## 2013-05-02 MED ORDER — METOPROLOL TARTRATE 12.5 MG HALF TABLET: 12.5000 mg | ORAL_TABLET | Freq: Two times a day (BID) | ORAL | Status: DC

## 2013-05-02 MED ORDER — WARFARIN SODIUM 2 MG PO TABS: 1.0000 mg | ORAL_TABLET | Freq: Every day | ORAL | Status: DC

## 2013-05-02 NOTE — Progress Notes (Signed)
Patient is medically stable for D/C today to [facility]. Per Deirdre Pippins admissions coordinator at [facility] patient is going to room 403 A. Patient's son (HPOA) Keahi Mccarney [phone] reported that he is doing paper work with Narda Rutherford at 2 pm and will transport patient to facility in his private vehicle. Clinical Education officer, museum (CSW) prepared D/C packet and gave it to [employer]. Son requested that patient be dressed in his sweatpants for transport. CSW left message with nursing secretary about sweatpants. Please reconsult if further social work needs arise. CSW signing off.   Blima Rich, [place] Weekend CSW [phone]

## 2013-05-02 NOTE — Progress Notes (Signed)
Patient ID: Justin Chambers, male   DOB: [birth year], 80 y.o.   MRN: [id number] 5 Days Post-Op  Subjective: No acute events.  Drain removed yesterday.     Objective: Vital signs in last 24 hours: Temp:  [97.8 F (36.6 C)-98.7 F (37.1 C)] 98.4 F (36.9 C) (03/01 0412) Pulse Rate:  [65-79] 69 (03/01 0412) Resp:  [18-19] 18 (03/01 0412) BP: (121-164)/(52-83) 164/78 mmHg (03/01 0412) SpO2:  [95 %-98 %] 95 % (03/01 0412) Last BM Date: 05/28/13  Intake/Output from previous day: 02/28 0701 - 03/01 0700 In: 360 [P.O.:360] Out: 1145 [Urine:1100; Drains:45] Intake/Output this shift:    GI: soft, non-tender; bowel sounds normal; no masses,  no organomegaly incisions c/d/i  Lab Results:   Recent Labs  05/01/13 0310 05/02/13 0330  WBC 10.6* 11.3*  HGB 11.8* 11.9*  HCT 34.8* 34.5*  PLT 198 227   BMET  Recent Labs  05/01/13 0310 05/02/13 0330  NA 142 144  K 3.8 3.9  CL 106 107  CO2 23 23  GLUCOSE 119* 126*  BUN 29* 24*  CREATININE 1.56* 1.42*  CALCIUM 8.0* 8.3*   PT/INR  Recent Labs  05/01/13 0310 05/02/13 0330  LABPROT 24.7* 28.4*  INR 2.32* 2.78*   ABG No results found for this basename: PHART, PCO2, PO2, HCO3,  in the last 72 hours  Studies/Results: No results found.  Anti-infectives: Anti-infectives   Start     Dose/Rate Route Frequency Ordered Stop   04/25/13 1400  piperacillin-tazobactam (ZOSYN) IVPB 3.375 g  Status:  Discontinued     3.375 g 12.5 mL/hr over 240 Minutes Intravenous 3 times per day 04/25/13 1344 04/30/13 0844   04/25/13 0600  Ampicillin-Sulbactam (UNASYN) 3 g in sodium chloride 0.9 % 100 mL IVPB  Status:  Discontinued     3 g 100 mL/hr over 60 Minutes Intravenous Every 8 hours 04/24/13 2313 04/25/13 1232   04/24/13 2145  Ampicillin-Sulbactam (UNASYN) 3 g in sodium chloride 0.9 % 100 mL IVPB     3 g 100 mL/hr over 60 Minutes Intravenous  Once 04/24/13 2132 04/24/13 2325      Assessment/Plan: s/p Procedure(s): LAPAROSCOPIC  CHOLECYSTECTOMY WITH INTRAOPERATIVE CHOLANGIOGRAM (N/A) LAPAROSCOPIC LYSIS OF ADHESIONS (N/A) Drain removed. Doing well after cholecystectomy Ready for discharge to SNF/ rehab from surgical standpoint.   Will sign off, call for further questions.    LOS: 8 days    [facility] 05/02/2013

## 2013-05-02 NOTE — Discharge Summary (Signed)
Physician Discharge Summary  Patient ID: Justin Chambers MRN: [id number] DOB/AGE: 1923/10/11 80 y.o.  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   He was placed on antibiotics and his anticoagulation for atrial fibrillation was allowed to reverse.  On 04/27/13 he underwent cholecystectomy and he tolerated this well.  His drain was removed on 05/01/13.   He progressed well postoperatively but he is weak and has significant dementia.  It was felt that he would benefit from snf rehab after the surgery. On 05/02/13 he was discharged to [facility] in stable condition  Consults: general surgery  Significant Diagnostic Studies:   Ct abdomen IMPRESSION:  Distended gallbladder with mild adjacent edema, cannot exclude  cholecystitis ; consider followup ultrasound.  Small bilateral renal cysts.  Significant prostatic enlargement.  Small left inguinal hernia containing fat.  Remainder of exam unremarkable.  CBC    Component Value Date/Time   WBC 11.3* 05/02/2013 0330   RBC 4.31 05/02/2013 0330   HGB 11.9* 05/02/2013 0330   HCT 34.5* 05/02/2013 0330   PLT 227 05/02/2013 0330   MCV 80.0 05/02/2013 0330   MCH 27.6 05/02/2013 0330   MCHC 34.5 05/02/2013 0330   RDW 14.7 05/02/2013 0330   LYMPHSABS 0.8 04/24/2013 1254   MONOABS 1.0 04/24/2013 1254   EOSABS 0.0 04/24/2013 1254   BASOSABS 0.0 04/24/2013 1254    BMET    Component Value Date/Time   NA 144 05/02/2013 0330   K 3.9 05/02/2013 0330   CL 107 05/02/2013 0330   CO2 23 05/02/2013 0330   GLUCOSE 126* 05/02/2013  0330   BUN 24* 05/02/2013 0330   CREATININE 1.42* 05/02/2013 0330   CALCIUM 8.3* 05/02/2013 0330   GFRNONAA 43* 05/02/2013 0330   GFRAA 50* 05/02/2013 0330    liver function tests Lab Results  Component Value Date   INR 2.78* 05/02/2013   INR 2.32* 05/01/2013   INR 1.68* 04/30/2013      Treatments: surgery and antibiotics  Discharge Exam:  Blood pressure 164/78, pulse 69, temperature 98.4 F (36.9 C), temperature source Oral, resp. rate 18, height 5\' 11"  (1.803 m), weight 73.4 kg (161 lb 13.1 oz), SpO2 95.00%.  Physical Exam:age appropriate. Has hiccups.  Nad.  Alert. Responds appropriately. cta bilat. No w/r/r. Heart is irreg, irreg, no m/r/g. abd soft, nt, nd, no mass or hsm Moe times 4, general weakness noted  Disposition:to snf     Medication List    Meds upon discharge:  Coumadin  1 mg po daily at 6 pm . metoprolol tartrate  12.5 mg Oral Twice daily                 Digoxin 0.125 mg by mouth daily Tylenol 500 mg one by mouth every 6 hours as needed for pain Vitamin d3 1000 iu daily Florastor 250 mg one by mouth twice daily for two weeks    He should have  A PT/inr check at the snf on Monday  05/03/13 to follow his inr closely.  He should have a cbc-diff and cmet at snf  On Thursday  05/06/13 at the snf.  Follow-up Information   Follow up with TSUEI,MATTHEW K., MD. Schedule an appointment as soon as possible for a visit in 3 weeks.   Specialty:  General Surgery   Contact information:   [place] [id number] [phone]     follow-up with dr. [name] at the snf  Signed: Crist Infante A 05/02/2013, 10:53 AM

## 2013-05-02 NOTE — Progress Notes (Signed)
Report called to [facility] at [employer]. Discharge packet to be sent with patient and son. Pt to be transported by son.

## 2013-05-05 DIAGNOSIS — R0609 Other forms of dyspnea: Secondary | ICD-10-CM | POA: Diagnosis not present

## 2013-05-05 DIAGNOSIS — R1013 Epigastric pain: Secondary | ICD-10-CM | POA: Diagnosis not present

## 2013-05-05 DIAGNOSIS — R0789 Other chest pain: Secondary | ICD-10-CM | POA: Diagnosis not present

## 2013-05-05 DIAGNOSIS — R110 Nausea: Secondary | ICD-10-CM | POA: Diagnosis not present

## 2013-05-18 ENCOUNTER — Ambulatory Visit (INDEPENDENT_AMBULATORY_CARE_PROVIDER_SITE_OTHER): Payer: Medicare Other | Admitting: Surgery

## 2013-05-18 ENCOUNTER — Encounter (INDEPENDENT_AMBULATORY_CARE_PROVIDER_SITE_OTHER): Payer: Self-pay | Admitting: Surgery

## 2013-05-18 VITALS — BP 120/80 | HR 66 | Temp 99.1°F | Resp 16 | Ht 71.0 in

## 2013-05-18 DIAGNOSIS — K810 Acute cholecystitis: Secondary | ICD-10-CM | POA: Insufficient documentation

## 2013-05-18 NOTE — Progress Notes (Signed)
Status post laparoscopic cholecystectomy for Acute gangrenous cholecystitis on 04/27/13.  He was discharged to [facility] on 05/02/13. He is doing quite well. His appetite is improving. He does not like the food tablet with all. He is having daily bowel movements with no sign of diarrhea. He denies any abdominal pain.  Filed Vitals:   05/18/13 1402  BP: 120/80  Pulse: 66  Temp: 99.1 F (37.3 C)  Resp: 16   His abdomen is soft and nontender. His incisions are well-healed with no sign of infection. Skin is warm and dry with no sign of jaundice  S/p lap chole for acute cholecystitis.  Doing well  No limits on diet or activity Follow-up PRN.  Imogene Burn. [name], MD, [facility]  General/ Trauma Surgery  05/18/2013 2:24 PM

## 2013-05-28 DIAGNOSIS — R0609 Other forms of dyspnea: Secondary | ICD-10-CM | POA: Diagnosis not present

## 2013-05-28 DIAGNOSIS — R1013 Epigastric pain: Secondary | ICD-10-CM | POA: Diagnosis not present

## 2013-05-28 DIAGNOSIS — R110 Nausea: Secondary | ICD-10-CM | POA: Diagnosis not present

## 2013-05-28 DIAGNOSIS — R0989 Other specified symptoms and signs involving the circulatory and respiratory systems: Secondary | ICD-10-CM | POA: Diagnosis not present

## 2013-06-03 DIAGNOSIS — R262 Difficulty in walking, not elsewhere classified: Secondary | ICD-10-CM | POA: Diagnosis not present

## 2013-06-03 DIAGNOSIS — R269 Unspecified abnormalities of gait and mobility: Secondary | ICD-10-CM | POA: Diagnosis not present

## 2013-06-03 DIAGNOSIS — M6281 Muscle weakness (generalized): Secondary | ICD-10-CM | POA: Diagnosis not present

## 2013-06-03 DIAGNOSIS — R279 Unspecified lack of coordination: Secondary | ICD-10-CM | POA: Diagnosis not present

## 2013-06-04 DIAGNOSIS — R279 Unspecified lack of coordination: Secondary | ICD-10-CM | POA: Diagnosis not present

## 2013-06-04 DIAGNOSIS — M6281 Muscle weakness (generalized): Secondary | ICD-10-CM | POA: Diagnosis not present

## 2013-06-04 DIAGNOSIS — R269 Unspecified abnormalities of gait and mobility: Secondary | ICD-10-CM | POA: Diagnosis not present

## 2013-06-04 DIAGNOSIS — R262 Difficulty in walking, not elsewhere classified: Secondary | ICD-10-CM | POA: Diagnosis not present

## 2013-06-07 DIAGNOSIS — R269 Unspecified abnormalities of gait and mobility: Secondary | ICD-10-CM | POA: Diagnosis not present

## 2013-06-07 DIAGNOSIS — R262 Difficulty in walking, not elsewhere classified: Secondary | ICD-10-CM | POA: Diagnosis not present

## 2013-06-07 DIAGNOSIS — M6281 Muscle weakness (generalized): Secondary | ICD-10-CM | POA: Diagnosis not present

## 2013-06-07 DIAGNOSIS — R279 Unspecified lack of coordination: Secondary | ICD-10-CM | POA: Diagnosis not present

## 2013-06-08 DIAGNOSIS — R269 Unspecified abnormalities of gait and mobility: Secondary | ICD-10-CM | POA: Diagnosis not present

## 2013-06-08 DIAGNOSIS — M6281 Muscle weakness (generalized): Secondary | ICD-10-CM | POA: Diagnosis not present

## 2013-06-08 DIAGNOSIS — R262 Difficulty in walking, not elsewhere classified: Secondary | ICD-10-CM | POA: Diagnosis not present

## 2013-06-08 DIAGNOSIS — R279 Unspecified lack of coordination: Secondary | ICD-10-CM | POA: Diagnosis not present

## 2013-06-10 DIAGNOSIS — M6281 Muscle weakness (generalized): Secondary | ICD-10-CM | POA: Diagnosis not present

## 2013-06-10 DIAGNOSIS — R269 Unspecified abnormalities of gait and mobility: Secondary | ICD-10-CM | POA: Diagnosis not present

## 2013-06-10 DIAGNOSIS — R262 Difficulty in walking, not elsewhere classified: Secondary | ICD-10-CM | POA: Diagnosis not present

## 2013-06-10 DIAGNOSIS — R279 Unspecified lack of coordination: Secondary | ICD-10-CM | POA: Diagnosis not present

## 2013-06-11 DIAGNOSIS — R279 Unspecified lack of coordination: Secondary | ICD-10-CM | POA: Diagnosis not present

## 2013-06-11 DIAGNOSIS — R269 Unspecified abnormalities of gait and mobility: Secondary | ICD-10-CM | POA: Diagnosis not present

## 2013-06-11 DIAGNOSIS — R262 Difficulty in walking, not elsewhere classified: Secondary | ICD-10-CM | POA: Diagnosis not present

## 2013-06-11 DIAGNOSIS — M6281 Muscle weakness (generalized): Secondary | ICD-10-CM | POA: Diagnosis not present

## 2013-06-14 DIAGNOSIS — R269 Unspecified abnormalities of gait and mobility: Secondary | ICD-10-CM | POA: Diagnosis not present

## 2013-06-14 DIAGNOSIS — R279 Unspecified lack of coordination: Secondary | ICD-10-CM | POA: Diagnosis not present

## 2013-06-14 DIAGNOSIS — R262 Difficulty in walking, not elsewhere classified: Secondary | ICD-10-CM | POA: Diagnosis not present

## 2013-06-14 DIAGNOSIS — M6281 Muscle weakness (generalized): Secondary | ICD-10-CM | POA: Diagnosis not present

## 2013-06-15 DIAGNOSIS — R269 Unspecified abnormalities of gait and mobility: Secondary | ICD-10-CM | POA: Diagnosis not present

## 2013-06-15 DIAGNOSIS — R262 Difficulty in walking, not elsewhere classified: Secondary | ICD-10-CM | POA: Diagnosis not present

## 2013-06-15 DIAGNOSIS — M6281 Muscle weakness (generalized): Secondary | ICD-10-CM | POA: Diagnosis not present

## 2013-06-15 DIAGNOSIS — I4891 Unspecified atrial fibrillation: Secondary | ICD-10-CM | POA: Diagnosis not present

## 2013-06-15 DIAGNOSIS — Z7901 Long term (current) use of anticoagulants: Secondary | ICD-10-CM | POA: Diagnosis not present

## 2013-06-15 DIAGNOSIS — R279 Unspecified lack of coordination: Secondary | ICD-10-CM | POA: Diagnosis not present

## 2013-06-16 DIAGNOSIS — R279 Unspecified lack of coordination: Secondary | ICD-10-CM | POA: Diagnosis not present

## 2013-06-16 DIAGNOSIS — R269 Unspecified abnormalities of gait and mobility: Secondary | ICD-10-CM | POA: Diagnosis not present

## 2013-06-16 DIAGNOSIS — R262 Difficulty in walking, not elsewhere classified: Secondary | ICD-10-CM | POA: Diagnosis not present

## 2013-06-16 DIAGNOSIS — M6281 Muscle weakness (generalized): Secondary | ICD-10-CM | POA: Diagnosis not present

## 2013-06-18 DIAGNOSIS — R269 Unspecified abnormalities of gait and mobility: Secondary | ICD-10-CM | POA: Diagnosis not present

## 2013-06-18 DIAGNOSIS — R279 Unspecified lack of coordination: Secondary | ICD-10-CM | POA: Diagnosis not present

## 2013-06-18 DIAGNOSIS — R262 Difficulty in walking, not elsewhere classified: Secondary | ICD-10-CM | POA: Diagnosis not present

## 2013-06-18 DIAGNOSIS — M6281 Muscle weakness (generalized): Secondary | ICD-10-CM | POA: Diagnosis not present

## 2013-06-21 DIAGNOSIS — R279 Unspecified lack of coordination: Secondary | ICD-10-CM | POA: Diagnosis not present

## 2013-06-21 DIAGNOSIS — R262 Difficulty in walking, not elsewhere classified: Secondary | ICD-10-CM | POA: Diagnosis not present

## 2013-06-21 DIAGNOSIS — M6281 Muscle weakness (generalized): Secondary | ICD-10-CM | POA: Diagnosis not present

## 2013-06-21 DIAGNOSIS — R269 Unspecified abnormalities of gait and mobility: Secondary | ICD-10-CM | POA: Diagnosis not present

## 2013-06-22 DIAGNOSIS — M6281 Muscle weakness (generalized): Secondary | ICD-10-CM | POA: Diagnosis not present

## 2013-06-22 DIAGNOSIS — H6120 Impacted cerumen, unspecified ear: Secondary | ICD-10-CM | POA: Diagnosis not present

## 2013-06-22 DIAGNOSIS — R269 Unspecified abnormalities of gait and mobility: Secondary | ICD-10-CM | POA: Diagnosis not present

## 2013-06-22 DIAGNOSIS — R279 Unspecified lack of coordination: Secondary | ICD-10-CM | POA: Diagnosis not present

## 2013-06-22 DIAGNOSIS — H9190 Unspecified hearing loss, unspecified ear: Secondary | ICD-10-CM | POA: Diagnosis not present

## 2013-06-22 DIAGNOSIS — R262 Difficulty in walking, not elsewhere classified: Secondary | ICD-10-CM | POA: Diagnosis not present

## 2013-06-23 DIAGNOSIS — M6281 Muscle weakness (generalized): Secondary | ICD-10-CM | POA: Diagnosis not present

## 2013-06-23 DIAGNOSIS — R269 Unspecified abnormalities of gait and mobility: Secondary | ICD-10-CM | POA: Diagnosis not present

## 2013-06-23 DIAGNOSIS — R262 Difficulty in walking, not elsewhere classified: Secondary | ICD-10-CM | POA: Diagnosis not present

## 2013-06-23 DIAGNOSIS — R279 Unspecified lack of coordination: Secondary | ICD-10-CM | POA: Diagnosis not present

## 2013-06-24 DIAGNOSIS — M6281 Muscle weakness (generalized): Secondary | ICD-10-CM | POA: Diagnosis not present

## 2013-06-24 DIAGNOSIS — R279 Unspecified lack of coordination: Secondary | ICD-10-CM | POA: Diagnosis not present

## 2013-06-24 DIAGNOSIS — R269 Unspecified abnormalities of gait and mobility: Secondary | ICD-10-CM | POA: Diagnosis not present

## 2013-06-24 DIAGNOSIS — R262 Difficulty in walking, not elsewhere classified: Secondary | ICD-10-CM | POA: Diagnosis not present

## 2013-06-25 DIAGNOSIS — R279 Unspecified lack of coordination: Secondary | ICD-10-CM | POA: Diagnosis not present

## 2013-06-25 DIAGNOSIS — R262 Difficulty in walking, not elsewhere classified: Secondary | ICD-10-CM | POA: Diagnosis not present

## 2013-06-25 DIAGNOSIS — R269 Unspecified abnormalities of gait and mobility: Secondary | ICD-10-CM | POA: Diagnosis not present

## 2013-06-25 DIAGNOSIS — M6281 Muscle weakness (generalized): Secondary | ICD-10-CM | POA: Diagnosis not present

## 2013-06-28 DIAGNOSIS — R269 Unspecified abnormalities of gait and mobility: Secondary | ICD-10-CM | POA: Diagnosis not present

## 2013-06-28 DIAGNOSIS — R262 Difficulty in walking, not elsewhere classified: Secondary | ICD-10-CM | POA: Diagnosis not present

## 2013-06-28 DIAGNOSIS — R279 Unspecified lack of coordination: Secondary | ICD-10-CM | POA: Diagnosis not present

## 2013-06-28 DIAGNOSIS — M6281 Muscle weakness (generalized): Secondary | ICD-10-CM | POA: Diagnosis not present

## 2013-06-29 DIAGNOSIS — R279 Unspecified lack of coordination: Secondary | ICD-10-CM | POA: Diagnosis not present

## 2013-06-29 DIAGNOSIS — R269 Unspecified abnormalities of gait and mobility: Secondary | ICD-10-CM | POA: Diagnosis not present

## 2013-06-29 DIAGNOSIS — R262 Difficulty in walking, not elsewhere classified: Secondary | ICD-10-CM | POA: Diagnosis not present

## 2013-06-29 DIAGNOSIS — M6281 Muscle weakness (generalized): Secondary | ICD-10-CM | POA: Diagnosis not present

## 2013-06-30 DIAGNOSIS — M6281 Muscle weakness (generalized): Secondary | ICD-10-CM | POA: Diagnosis not present

## 2013-06-30 DIAGNOSIS — R279 Unspecified lack of coordination: Secondary | ICD-10-CM | POA: Diagnosis not present

## 2013-06-30 DIAGNOSIS — R262 Difficulty in walking, not elsewhere classified: Secondary | ICD-10-CM | POA: Diagnosis not present

## 2013-06-30 DIAGNOSIS — R269 Unspecified abnormalities of gait and mobility: Secondary | ICD-10-CM | POA: Diagnosis not present

## 2013-07-01 DIAGNOSIS — M6281 Muscle weakness (generalized): Secondary | ICD-10-CM | POA: Diagnosis not present

## 2013-07-01 DIAGNOSIS — R279 Unspecified lack of coordination: Secondary | ICD-10-CM | POA: Diagnosis not present

## 2013-07-01 DIAGNOSIS — R269 Unspecified abnormalities of gait and mobility: Secondary | ICD-10-CM | POA: Diagnosis not present

## 2013-07-01 DIAGNOSIS — R262 Difficulty in walking, not elsewhere classified: Secondary | ICD-10-CM | POA: Diagnosis not present

## 2013-07-02 DIAGNOSIS — M6281 Muscle weakness (generalized): Secondary | ICD-10-CM | POA: Diagnosis not present

## 2013-07-02 DIAGNOSIS — R262 Difficulty in walking, not elsewhere classified: Secondary | ICD-10-CM | POA: Diagnosis not present

## 2013-07-02 DIAGNOSIS — R279 Unspecified lack of coordination: Secondary | ICD-10-CM | POA: Diagnosis not present

## 2013-07-02 DIAGNOSIS — R269 Unspecified abnormalities of gait and mobility: Secondary | ICD-10-CM | POA: Diagnosis not present

## 2013-08-02 DIAGNOSIS — R269 Unspecified abnormalities of gait and mobility: Secondary | ICD-10-CM | POA: Diagnosis not present

## 2013-08-02 DIAGNOSIS — R262 Difficulty in walking, not elsewhere classified: Secondary | ICD-10-CM | POA: Diagnosis not present

## 2013-08-02 DIAGNOSIS — R279 Unspecified lack of coordination: Secondary | ICD-10-CM | POA: Diagnosis not present

## 2013-08-02 DIAGNOSIS — M6281 Muscle weakness (generalized): Secondary | ICD-10-CM | POA: Diagnosis not present

## 2013-08-03 DIAGNOSIS — M6281 Muscle weakness (generalized): Secondary | ICD-10-CM | POA: Diagnosis not present

## 2013-08-03 DIAGNOSIS — R262 Difficulty in walking, not elsewhere classified: Secondary | ICD-10-CM | POA: Diagnosis not present

## 2013-08-03 DIAGNOSIS — R269 Unspecified abnormalities of gait and mobility: Secondary | ICD-10-CM | POA: Diagnosis not present

## 2013-08-03 DIAGNOSIS — R279 Unspecified lack of coordination: Secondary | ICD-10-CM | POA: Diagnosis not present

## 2013-08-04 DIAGNOSIS — R269 Unspecified abnormalities of gait and mobility: Secondary | ICD-10-CM | POA: Diagnosis not present

## 2013-08-04 DIAGNOSIS — R279 Unspecified lack of coordination: Secondary | ICD-10-CM | POA: Diagnosis not present

## 2013-08-04 DIAGNOSIS — M6281 Muscle weakness (generalized): Secondary | ICD-10-CM | POA: Diagnosis not present

## 2013-08-04 DIAGNOSIS — R262 Difficulty in walking, not elsewhere classified: Secondary | ICD-10-CM | POA: Diagnosis not present

## 2013-08-05 DIAGNOSIS — M6281 Muscle weakness (generalized): Secondary | ICD-10-CM | POA: Diagnosis not present

## 2013-08-05 DIAGNOSIS — R262 Difficulty in walking, not elsewhere classified: Secondary | ICD-10-CM | POA: Diagnosis not present

## 2013-08-05 DIAGNOSIS — R269 Unspecified abnormalities of gait and mobility: Secondary | ICD-10-CM | POA: Diagnosis not present

## 2013-08-05 DIAGNOSIS — R279 Unspecified lack of coordination: Secondary | ICD-10-CM | POA: Diagnosis not present

## 2013-08-06 DIAGNOSIS — M6281 Muscle weakness (generalized): Secondary | ICD-10-CM | POA: Diagnosis not present

## 2013-08-06 DIAGNOSIS — R279 Unspecified lack of coordination: Secondary | ICD-10-CM | POA: Diagnosis not present

## 2013-08-06 DIAGNOSIS — R269 Unspecified abnormalities of gait and mobility: Secondary | ICD-10-CM | POA: Diagnosis not present

## 2013-08-06 DIAGNOSIS — R262 Difficulty in walking, not elsewhere classified: Secondary | ICD-10-CM | POA: Diagnosis not present

## 2013-08-09 DIAGNOSIS — R279 Unspecified lack of coordination: Secondary | ICD-10-CM | POA: Diagnosis not present

## 2013-08-09 DIAGNOSIS — R262 Difficulty in walking, not elsewhere classified: Secondary | ICD-10-CM | POA: Diagnosis not present

## 2013-08-09 DIAGNOSIS — R269 Unspecified abnormalities of gait and mobility: Secondary | ICD-10-CM | POA: Diagnosis not present

## 2013-08-09 DIAGNOSIS — M6281 Muscle weakness (generalized): Secondary | ICD-10-CM | POA: Diagnosis not present

## 2013-08-26 DIAGNOSIS — L570 Actinic keratosis: Secondary | ICD-10-CM | POA: Diagnosis not present

## 2013-08-26 DIAGNOSIS — L821 Other seborrheic keratosis: Secondary | ICD-10-CM | POA: Diagnosis not present

## 2013-08-26 DIAGNOSIS — IMO0002 Reserved for concepts with insufficient information to code with codable children: Secondary | ICD-10-CM | POA: Diagnosis not present

## 2013-08-26 DIAGNOSIS — Z85828 Personal history of other malignant neoplasm of skin: Secondary | ICD-10-CM | POA: Diagnosis not present

## 2013-09-10 DIAGNOSIS — I4891 Unspecified atrial fibrillation: Secondary | ICD-10-CM | POA: Diagnosis not present

## 2013-09-10 DIAGNOSIS — R197 Diarrhea, unspecified: Secondary | ICD-10-CM | POA: Diagnosis not present

## 2013-09-10 DIAGNOSIS — D509 Iron deficiency anemia, unspecified: Secondary | ICD-10-CM | POA: Diagnosis not present

## 2013-09-10 DIAGNOSIS — IMO0002 Reserved for concepts with insufficient information to code with codable children: Secondary | ICD-10-CM | POA: Diagnosis not present

## 2013-09-10 DIAGNOSIS — H6120 Impacted cerumen, unspecified ear: Secondary | ICD-10-CM | POA: Diagnosis not present

## 2013-09-10 DIAGNOSIS — H9190 Unspecified hearing loss, unspecified ear: Secondary | ICD-10-CM | POA: Diagnosis not present

## 2013-09-24 DIAGNOSIS — M79609 Pain in unspecified limb: Secondary | ICD-10-CM | POA: Diagnosis not present

## 2013-09-24 DIAGNOSIS — L03039 Cellulitis of unspecified toe: Secondary | ICD-10-CM | POA: Diagnosis not present

## 2013-10-27 DIAGNOSIS — IMO0002 Reserved for concepts with insufficient information to code with codable children: Secondary | ICD-10-CM | POA: Diagnosis not present

## 2013-10-27 DIAGNOSIS — I4891 Unspecified atrial fibrillation: Secondary | ICD-10-CM | POA: Diagnosis not present

## 2013-10-29 DIAGNOSIS — L570 Actinic keratosis: Secondary | ICD-10-CM | POA: Diagnosis not present

## 2013-10-29 DIAGNOSIS — Z85828 Personal history of other malignant neoplasm of skin: Secondary | ICD-10-CM | POA: Diagnosis not present

## 2013-10-29 DIAGNOSIS — L820 Inflamed seborrheic keratosis: Secondary | ICD-10-CM | POA: Diagnosis not present

## 2013-10-29 DIAGNOSIS — L821 Other seborrheic keratosis: Secondary | ICD-10-CM | POA: Diagnosis not present

## 2013-11-12 DIAGNOSIS — IMO0002 Reserved for concepts with insufficient information to code with codable children: Secondary | ICD-10-CM | POA: Diagnosis not present

## 2013-11-12 DIAGNOSIS — I4891 Unspecified atrial fibrillation: Secondary | ICD-10-CM | POA: Diagnosis not present

## 2013-11-12 DIAGNOSIS — R413 Other amnesia: Secondary | ICD-10-CM | POA: Diagnosis not present

## 2013-11-12 DIAGNOSIS — M1990 Unspecified osteoarthritis, unspecified site: Secondary | ICD-10-CM | POA: Diagnosis not present

## 2013-12-13 DIAGNOSIS — Z23 Encounter for immunization: Secondary | ICD-10-CM | POA: Diagnosis not present

## 2013-12-15 DIAGNOSIS — R413 Other amnesia: Secondary | ICD-10-CM | POA: Diagnosis not present

## 2013-12-15 DIAGNOSIS — Z6822 Body mass index (BMI) 22.0-22.9, adult: Secondary | ICD-10-CM | POA: Diagnosis not present

## 2013-12-15 DIAGNOSIS — M1990 Unspecified osteoarthritis, unspecified site: Secondary | ICD-10-CM | POA: Diagnosis not present

## 2013-12-15 DIAGNOSIS — I482 Chronic atrial fibrillation: Secondary | ICD-10-CM | POA: Diagnosis not present

## 2013-12-21 DIAGNOSIS — L820 Inflamed seborrheic keratosis: Secondary | ICD-10-CM | POA: Diagnosis not present

## 2013-12-21 DIAGNOSIS — L821 Other seborrheic keratosis: Secondary | ICD-10-CM | POA: Diagnosis not present

## 2013-12-21 DIAGNOSIS — Z85828 Personal history of other malignant neoplasm of skin: Secondary | ICD-10-CM | POA: Diagnosis not present

## 2013-12-24 DIAGNOSIS — M79609 Pain in unspecified limb: Secondary | ICD-10-CM | POA: Diagnosis not present

## 2013-12-24 DIAGNOSIS — I739 Peripheral vascular disease, unspecified: Secondary | ICD-10-CM | POA: Diagnosis not present

## 2013-12-24 DIAGNOSIS — L603 Nail dystrophy: Secondary | ICD-10-CM | POA: Diagnosis not present

## 2013-12-24 DIAGNOSIS — B351 Tinea unguium: Secondary | ICD-10-CM | POA: Diagnosis not present

## 2014-01-25 DIAGNOSIS — Z79899 Other long term (current) drug therapy: Secondary | ICD-10-CM | POA: Diagnosis not present

## 2014-01-25 DIAGNOSIS — G319 Degenerative disease of nervous system, unspecified: Secondary | ICD-10-CM | POA: Diagnosis not present

## 2014-01-25 DIAGNOSIS — I482 Chronic atrial fibrillation: Secondary | ICD-10-CM | POA: Diagnosis not present

## 2014-01-25 DIAGNOSIS — I6782 Cerebral ischemia: Secondary | ICD-10-CM | POA: Diagnosis not present

## 2014-01-25 DIAGNOSIS — S0090XA Unspecified superficial injury of unspecified part of head, initial encounter: Secondary | ICD-10-CM | POA: Diagnosis not present

## 2014-01-25 DIAGNOSIS — R001 Bradycardia, unspecified: Secondary | ICD-10-CM | POA: Diagnosis not present

## 2014-01-25 DIAGNOSIS — Z6821 Body mass index (BMI) 21.0-21.9, adult: Secondary | ICD-10-CM | POA: Diagnosis not present

## 2014-01-25 DIAGNOSIS — D509 Iron deficiency anemia, unspecified: Secondary | ICD-10-CM | POA: Diagnosis not present

## 2014-01-25 DIAGNOSIS — W19XXXA Unspecified fall, initial encounter: Secondary | ICD-10-CM | POA: Diagnosis not present

## 2014-02-14 DIAGNOSIS — H6123 Impacted cerumen, bilateral: Secondary | ICD-10-CM | POA: Diagnosis not present

## 2014-02-14 DIAGNOSIS — Z6821 Body mass index (BMI) 21.0-21.9, adult: Secondary | ICD-10-CM | POA: Diagnosis not present

## 2014-02-14 DIAGNOSIS — H9193 Unspecified hearing loss, bilateral: Secondary | ICD-10-CM | POA: Diagnosis not present

## 2014-02-17 ENCOUNTER — Ambulatory Visit (INDEPENDENT_AMBULATORY_CARE_PROVIDER_SITE_OTHER): Payer: Medicare Other | Admitting: Cardiovascular Disease

## 2014-02-17 ENCOUNTER — Encounter: Payer: Self-pay | Admitting: Cardiovascular Disease

## 2014-02-17 VITALS — BP 130/80 | HR 61 | Ht 71.0 in | Wt 147.6 lb

## 2014-02-17 DIAGNOSIS — N183 Chronic kidney disease, stage 3 (moderate): Secondary | ICD-10-CM

## 2014-02-17 DIAGNOSIS — Z79899 Other long term (current) drug therapy: Secondary | ICD-10-CM | POA: Diagnosis not present

## 2014-02-17 DIAGNOSIS — R011 Cardiac murmur, unspecified: Secondary | ICD-10-CM

## 2014-02-17 DIAGNOSIS — I480 Paroxysmal atrial fibrillation: Secondary | ICD-10-CM

## 2014-02-17 DIAGNOSIS — Z7901 Long term (current) use of anticoagulants: Secondary | ICD-10-CM

## 2014-02-17 DIAGNOSIS — D509 Iron deficiency anemia, unspecified: Secondary | ICD-10-CM | POA: Diagnosis not present

## 2014-02-17 DIAGNOSIS — N1831 Chronic kidney disease, stage 3a: Secondary | ICD-10-CM

## 2014-02-17 DIAGNOSIS — D508 Other iron deficiency anemias: Secondary | ICD-10-CM

## 2014-02-17 MED ORDER — DIGOXIN 125 MCG PO TABS: 0.0625 mg | ORAL_TABLET | Freq: Every day | ORAL | Status: DC

## 2014-02-17 NOTE — Patient Instructions (Signed)
Your physician has requested that you have an echocardiogram. Echocardiography is a painless test that uses sound waves to create images of your heart. It provides your doctor with information about the size and shape of your heart and how well your heart's chambers and valves are working. This procedure takes approximately one hour. There are no restrictions for this procedure.  Your physician recommends that you return for lab work.  Your physician has recommended you make the following change in your medication: cut the digoxin into 1/2. Take 1/2 tablet daily.  Your physician recommends that you schedule a follow-up appointment in: 1 month.

## 2014-02-17 NOTE — Progress Notes (Signed)
Patient ID: Justin Chambers, male   DOB: [birth year], 80 y.o.   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   HPI: Justin Chambers has a history of chronic atrial fibrillation, hyperlipidemia, remote gastric ulcer and partial gastrectomy, who recently underwent cholecystectomy in March 2015.  He was recently seen by Justin Chambers on 01/26/2014 at which time his heart rate was noted to be at 47 in atrial fibrillation.  Presently, he is on digoxin 0.125 mg daily and a recent dig.  Level was 1.0.  He also has been taking metoprolol, tartrate one half of a 12.5 mg pill twice a day.  Recently, he was started on NOAC therapy with Savaysa at 30 mg daily.  He denies any bleeding.  He also takes Synthroid 50 g daily.  He continues to exercise several days per week.  He is unaware of any chest pain or shortness of breath.  He is unaware of any significant rapid heartbeats and denies any episodes of syncope.  He is referred for cardiology evaluation.  Remotely, he had seen Justin Chambers many years ago.  Past Medical History  Diagnosis Date  . Atrial fibrillation   . Dementia   . Chronic kidney disease     Past Surgical History  Procedure Laterality Date  . Abdominal surgery      part of stomach removed  . Partial gastrectomy Bilateral   . Cholecystectomy N/A 04/27/2013    Procedure: LAPAROSCOPIC CHOLECYSTECTOMY WITH INTRAOPERATIVE CHOLANGIOGRAM;  Surgeon: Justin Burn. [name], MD;  Location: [place];  Service: General;  Laterality: N/A;  . Laparoscopic lysis of adhesions N/A 04/27/2013    Procedure: LAPAROSCOPIC LYSIS OF ADHESIONS;  Surgeon: Justin Burn. [name], MD;  Location: [place] [facility];  Service: General;  Laterality: N/A;    Allergies  Allergen Reactions  . Cephalosporins     Unknown, previously documented in [place], pt does not recall reactions  . Latex     Current  Outpatient Prescriptions  Medication Sig Dispense Refill  . acetaminophen (TYLENOL) 500 MG tablet Take 1 tablet (500 mg total) by mouth every 6 (six) hours as needed. 30 tablet 0  . Cholecalciferol (VITAMIN D-3) 1000 UNITS CAPS Take by mouth.    . digoxin (LANOXIN) 0.125 MG tablet Take 0.5 tablets (0.0625 mg total) by mouth daily. 30 tablet 6  . levothyroxine (SYNTHROID, LEVOTHROID) 50 MCG tablet Take 1 tablet by mouth daily.  0  . metoprolol tartrate (LOPRESSOR) 12.5 mg TABS tablet Take 0.5 tablets (12.5 mg total) by mouth 2 (two) times daily.    Marland Kitchen SAVAYSA 30 MG TABS tablet Take 1 tablet by mouth daily.  0   No current facility-administered medications for this visit.    Social history is notable in that he is divorced.  He has 6 children and 14 grandchildren.  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  History reviewed. No pertinent family history.  ROS General: Negative; No fevers, chills, or night sweats HEENT: Negative; No changes in vision or hearing, sinus congestion, difficulty swallowing Pulmonary: Negative; No cough, wheezing, shortness of breath, hemoptysis Cardiovascular:  See HPI; No chest pain, presyncope, syncope, palpitations, edema GI: Negative; No nausea, vomiting, diarrhea, or abdominal pain GU: Negative; No dysuria, hematuria, or difficulty  voiding Musculoskeletal: Negative; no myalgias, joint pain, or weakness Hematologic/Oncologic: Negative; no easy bruising, bleeding Endocrine: Negative; no heat/cold intolerance; no diabetes Neuro: Negative; no changes in balance, headaches Skin: Negative; No rashes or skin lesions Psychiatric: Negative; No behavioral problems, depression Sleep: Negative; No daytime sleepiness, hypersomnolence, bruxism, restless legs, hypnogagnic hallucinations Other comprehensive 14 point system review is negative   Physical Exam BP  130/80 mmHg  Pulse 61  Ht 5\' 11"  (1.803 m)  Wt 147 lb 9.6 oz (66.951 kg)  BMI 20.60 kg/m2 General: Alert, oriented, no distress.  Skin: normal turgor, no rashes, warm and dry HEENT: Normocephalic, atraumatic. Pupils equal round and reactive to light; sclera anicteric; extraocular muscles intact; Fundi no hemorrhages or exudates.  Disks flat Nose without nasal septal hypertrophy Mouth/Parynx benign; Mallinpatti scale 2 Neck: No JVD, no carotid bruits; normal carotid upstroke Lungs: clear to ausculatation and percussion; no wheezing or rales Chest wall: without tenderness to palpitation Heart: PMI not displaced, RRR, s1 s2 normal, 6-3/8 systolic murmur heard in the aortic area, lower sternal border, as well as in the mitral region.  The murmur was harsh.  No rubs thrills or heaves Abdomen: soft, nontender; no hepatosplenomehaly, BS+; abdominal aorta nontender and not dilated by palpation. Back: no CVA tenderness Pulses 2+ Musculoskeletal: full range of motion, normal strength, no joint deformities Extremities: no clubbing cyanosis or edema, Homan's sign negative  Neurologic: grossly nonfocal; Cranial nerves grossly wnl Psychologic: Normal mood and affect   ECG (independently read by me): Atrial fibrillation with a rate at 61 bpm.  Isolated PVC.  Nonspecific ST-T changes probably contributed by digoxin effect.  LABS:  BMET    Component Value Date/Time   NA 144 05/02/2013 0330   K 3.9 05/02/2013 0330   CL 107 05/02/2013 0330   CO2 23 05/02/2013 0330   GLUCOSE 126* 05/02/2013 0330   BUN 24* 05/02/2013 0330   CREATININE 1.42* 05/02/2013 0330   CALCIUM 8.3* 05/02/2013 0330   GFRNONAA 43* 05/02/2013 0330   GFRAA 50* 05/02/2013 0330     Hepatic Function Panel     Component Value Date/Time   PROT 5.3* 05/02/2013 0330   ALBUMIN 1.9* 05/02/2013 0330   AST 18 05/02/2013 0330   ALT 13 05/02/2013 0330   ALKPHOS 100 05/02/2013 0330   BILITOT 0.5 05/02/2013 0330     CBC      Component Value Date/Time   WBC 11.3* 05/02/2013 0330   RBC 4.31 05/02/2013 0330   HGB 11.9* 05/02/2013 0330   HCT 34.5* 05/02/2013 0330   PLT 227 05/02/2013 0330   MCV 80.0 05/02/2013 0330   MCH 27.6 05/02/2013 0330   MCHC 34.5 05/02/2013 0330   RDW 14.7 05/02/2013 0330   LYMPHSABS 0.8 04/24/2013 1254   MONOABS 1.0 04/24/2013 1254   EOSABS 0.0 04/24/2013 1254   BASOSABS 0.0 04/24/2013 1254     BNP    Component Value Date/Time   PROBNP 295.0* 05/09/2008 1707    Lipid Panel  No results found for: CHOL, TRIG, HDL, CHOLHDL, VLDL, LDLCALC, LDLDIRECT    RADIOLOGY: No results found.   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  I reviewed the ECG and data from Dr. Jacquiline Doe office.  He currently has been on both Lanoxin 0.125 mg daily as well as metoprolol, tartrate 6.25 mg twice a day.  I am recommending that  he reduce his digoxin to 0.0625 mg with anal to keep his level at approximately 0.8.  He does have a 1-1/0 harsh systolic murmur which may represent both aortic as well as mitral valvular disease.  I'm scheduling him for an echo Doppler study for further evaluation.  I've also reviewed recent laboratory and he has a microcytic anemia with an MCV of 71, hemoglobin 10.7 and hematocrit 32.6.  He also has chronic kidney disease with his most recent BUN at 22 and creatinine 1.6, which places him in a stage III category.  His blood pressure today was labile and initially was 130/80 and on repeat was 158/70.  His recent thyroid function studies were normal.  I will see him back in the office in approximate 1 month after his echo Doppler study and additional laboratory will be done consisting of a digoxin level, renal function, and also iron studies with follow-up CBC since he is on anticoagulation therapy and has a microcytic  anemia.   Troy Sine, MD, [facility] 02/17/2014 8:12 PM

## 2014-02-21 DIAGNOSIS — N401 Enlarged prostate with lower urinary tract symptoms: Secondary | ICD-10-CM | POA: Diagnosis not present

## 2014-02-21 DIAGNOSIS — N528 Other male erectile dysfunction: Secondary | ICD-10-CM | POA: Diagnosis not present

## 2014-03-11 ENCOUNTER — Ambulatory Visit (HOSPITAL_COMMUNITY)
Admission: RE | Admit: 2014-03-11 | Discharge: 2014-03-11 | Disposition: A | Discharge status: Home / Self Care | Payer: Medicare Other | Source: Ambulatory Visit | Attending: Cardiology | Admitting: Cardiology

## 2014-03-11 DIAGNOSIS — I369 Nonrheumatic tricuspid valve disorder, unspecified: Secondary | ICD-10-CM | POA: Diagnosis not present

## 2014-03-11 DIAGNOSIS — R011 Cardiac murmur, unspecified: Secondary | ICD-10-CM

## 2014-03-11 DIAGNOSIS — I480 Paroxysmal atrial fibrillation: Secondary | ICD-10-CM

## 2014-03-11 NOTE — Progress Notes (Signed)
2D Echocardiogram Complete.  03/11/2014   Justin Chambers, [place]

## 2014-03-22 ENCOUNTER — Encounter: Payer: Self-pay | Admitting: *Deleted

## 2014-04-14 DIAGNOSIS — I482 Chronic atrial fibrillation: Secondary | ICD-10-CM | POA: Diagnosis not present

## 2014-04-14 DIAGNOSIS — D509 Iron deficiency anemia, unspecified: Secondary | ICD-10-CM | POA: Diagnosis not present

## 2014-04-14 DIAGNOSIS — Z79899 Other long term (current) drug therapy: Secondary | ICD-10-CM | POA: Diagnosis not present

## 2014-04-22 ENCOUNTER — Encounter: Payer: Self-pay | Admitting: Cardiovascular Disease

## 2014-04-22 ENCOUNTER — Ambulatory Visit (INDEPENDENT_AMBULATORY_CARE_PROVIDER_SITE_OTHER): Payer: Medicare Other | Admitting: Cardiovascular Disease

## 2014-04-22 VITALS — BP 102/72 | HR 56 | Ht 71.0 in | Wt 147.8 lb

## 2014-04-22 DIAGNOSIS — I495 Sick sinus syndrome: Secondary | ICD-10-CM | POA: Diagnosis not present

## 2014-04-22 DIAGNOSIS — I482 Chronic atrial fibrillation, unspecified: Secondary | ICD-10-CM

## 2014-04-22 DIAGNOSIS — N183 Chronic kidney disease, stage 3 (moderate): Secondary | ICD-10-CM

## 2014-04-22 DIAGNOSIS — Z7901 Long term (current) use of anticoagulants: Secondary | ICD-10-CM | POA: Diagnosis not present

## 2014-04-22 DIAGNOSIS — N1831 Chronic kidney disease, stage 3a: Secondary | ICD-10-CM

## 2014-04-22 DIAGNOSIS — I350 Nonrheumatic aortic (valve) stenosis: Secondary | ICD-10-CM | POA: Diagnosis not present

## 2014-04-22 NOTE — Patient Instructions (Addendum)
Echocardiogram (September 2016). Echocardiography is a painless test that uses sound waves to create images of your heart. It provides your doctor with information about the size and shape of your heart and how well your heart's chambers and valves are working. This procedure takes approximately one hour. There are no restrictions for this procedure.  Your physician wants you to follow-up in September or October following 2D Echo procedure. You will receive a reminder letter in the mail 2 months in advance. If you do not receive a letter, please call our office to schedule the follow-up appointment.  STOP taking Lopressor   STOP taking Digoxin

## 2014-04-24 ENCOUNTER — Encounter: Payer: Self-pay | Admitting: Cardiovascular Disease

## 2014-04-24 DIAGNOSIS — I495 Sick sinus syndrome: Secondary | ICD-10-CM | POA: Insufficient documentation

## 2014-04-24 NOTE — Progress Notes (Signed)
Patient ID: Justin Chambers, male   DOB: June 30, 1923, 81 y.o.   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  I had seen him for initial cardiology evaluation in December 2015 through the referral of Justin Chambers for evaluation of a slow pulse.  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  He was seen by Justin Chambers on 01/26/2014 at which time his heart rate was noted to be at 47 in atrial fibrillation.  H was on digoxin 0.125 mg daily and a digoxin level was 1.0.  He also had been taking metoprolol tartrate one half of a 12.5 mg pill twice a day.  He was started on NOAC therapy with Savaysa at 30 mg daily.  He denied any bleeding.    I evaluated him, he had a murmur suggestive of aortic stenosis.  He underwent a 2-D echo Doppler study on 03/11/2014.  Ejection fraction was 55-60%.  His aortic valve had moderate calcification and there was moderate stenosis with a mean gradient of 21 and a peak gradient of 36 mm.  There was mitral annular calcification.  He denies any episodes of dizziness.  He denies chest pain.  Eyes any CHF symptoms.  Review of his medications is notable that he is no longer taking his metoprolol but has been taking Lanoxin at 0.0625 mg and levothyroxine at 50 g in addition to his Savaysa 30 mg dose for anticoagulation.  He presents for evaluation.  Past Medical History  Diagnosis Date  . Atrial fibrillation   . Dementia   . Chronic kidney disease     Past Surgical History  Procedure Laterality Date  . Abdominal surgery      part of stomach removed  . Partial gastrectomy Bilateral   . Cholecystectomy N/A 04/27/2013    Procedure: LAPAROSCOPIC CHOLECYSTECTOMY WITH INTRAOPERATIVE CHOLANGIOGRAM;  Surgeon: Imogene Burn. [name], MD;  Location: [place];  Service: General;  Laterality: N/A;    . Laparoscopic lysis of adhesions N/A 04/27/2013    Procedure: LAPAROSCOPIC LYSIS OF ADHESIONS;  Surgeon: Imogene Burn. [name], MD;  Location: [facility];  Service: General;  Laterality: N/A;    Allergies  Allergen Reactions  . Cephalosporins     Unknown, previously documented in [place], pt does not recall reactions  . Latex     Current Outpatient Prescriptions  Medication Sig Dispense Refill  . Cholecalciferol (VITAMIN D-3) 1000 UNITS CAPS Take by mouth.    . levothyroxine (SYNTHROID, LEVOTHROID) 50 MCG tablet Take 1 tablet by mouth daily.  0  . SAVAYSA 30 MG TABS tablet Take 1 tablet by mouth daily.  0   No current facility-administered medications for this visit.    Social history is notable in that he is divorced.  He has 6 children and 14 grandchildren.  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  History reviewed. No pertinent family history.  ROS General: Negative; No fevers, chills, or night sweats HEENT: Negative; No changes in vision or hearing, sinus congestion, difficulty swallowing Pulmonary: Negative; No cough, wheezing, shortness of breath, hemoptysis Cardiovascular:  See HPI; No chest pain, presyncope, syncope, palpitations, edema GI: Negative; No nausea, vomiting, diarrhea, or abdominal pain GU: Negative; No dysuria, hematuria,  or difficulty voiding Musculoskeletal: Negative; no myalgias, joint pain, or weakness Hematologic/Oncologic: Negative; no easy bruising, bleeding Endocrine: Negative; no heat/cold intolerance; no diabetes Neuro: Negative; no changes in balance, headaches Skin: Negative; No rashes or skin lesions Psychiatric: Negative; No behavioral problems, depression Sleep: Negative; No daytime sleepiness, hypersomnolence, bruxism, restless legs, hypnogagnic hallucinations Other comprehensive 14 point system review is negative   Physical Exam BP  102/72 mmHg  Pulse 56  Ht 5\' 11"  (1.803 m)  Wt 147 lb 12.8 oz (67.042 kg)  BMI 20.62 kg/m2  Repeat blood pressure by me was 124/70 supine, and his blood pressure actually increased standing to 134/74. General: Alert, oriented, no distress.  Skin: normal turgor, no rashes, warm and dry HEENT: Normocephalic, atraumatic. Pupils equal round and reactive to light; sclera anicteric; extraocular muscles intact; Fundi no hemorrhages or exudates.  Disks flat Nose without nasal septal hypertrophy Mouth/Parynx benign; Mallinpatti scale 2 Neck: No JVD, no carotid bruits; normal carotid upstroke Lungs: clear to ausculatation and percussion; no wheezing or rales Chest wall: without tenderness to palpitation Heart: PMI not displaced, RRR, s1 s2 normal, 9-6/7 systolic murmur heard in the aortic area, lower sternal border, as well as in the mitral region.  The murmur was harsh.  No rubs thrills or heaves Abdomen: soft, nontender; no hepatosplenomehaly, BS+; abdominal aorta nontender and not dilated by palpation. Back: no CVA tenderness Pulses 2+ Musculoskeletal: full range of motion, normal strength, no joint deformities Extremities: no clubbing cyanosis or edema, Homan's sign negative  Neurologic: grossly nonfocal; Cranial nerves grossly wnl Psychologic: Normal mood and affect  ECG (independently read by me): Atrial fibrillation with a slow ventricular response with average rate in the mid 50s.  However, he has periods where his heart rate had dropped to the low 40s to upper 30s following a pause.   December 2015 ECG (independently read by me): Atrial fibrillation with a rate at 61 bpm.  Isolated PVC.  Nonspecific ST-T changes probably contributed by digoxin effect.  LABS:  BMET    Component Value Date/Time   NA 144 05/02/2013 0330   K 3.9 05/02/2013 0330   CL 107 05/02/2013 0330   CO2 23 05/02/2013 0330   GLUCOSE 126* 05/02/2013 0330   BUN 24* 05/02/2013 0330   CREATININE 1.42* 05/02/2013  0330   CALCIUM 8.3* 05/02/2013 0330   GFRNONAA 43* 05/02/2013 0330   GFRAA 50* 05/02/2013 0330     Hepatic Function Panel     Component Value Date/Time   PROT 5.3* 05/02/2013 0330   ALBUMIN 1.9* 05/02/2013 0330   AST 18 05/02/2013 0330   ALT 13 05/02/2013 0330   ALKPHOS 100 05/02/2013 0330   BILITOT 0.5 05/02/2013 0330     CBC    Component Value Date/Time   WBC 11.3* 05/02/2013 0330   RBC 4.31 05/02/2013 0330   HGB 11.9* 05/02/2013 0330   HCT 34.5* 05/02/2013 0330   PLT 227 05/02/2013 0330   MCV 80.0 05/02/2013 0330   MCH 27.6 05/02/2013 0330   MCHC 34.5 05/02/2013 0330   RDW 14.7 05/02/2013 0330   LYMPHSABS 0.8 04/24/2013 1254   MONOABS 1.0 04/24/2013 1254   EOSABS 0.0 04/24/2013 1254   BASOSABS 0.0 04/24/2013 1254     BNP    Component Value Date/Time   PROBNP 295.0* 05/09/2008 1707    Lipid Panel  No results found for: CHOL, TRIG, HDL, CHOLHDL, VLDL, LDLCALC, LDLDIRECT    RADIOLOGY: No results found.   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  When I saw him initially I reviewed the ECG and data from Dr. Jacquiline Doe office and he was on both Lanoxin 0.125 mg daily as well as metoprolol, tartrate 6.25 mg twice a day.  I am recommending that he reduce his digoxin to 0.0625 mg with anal to keep his level at approximately 0.8.  His physical exam reveals a 4-7/4 harsh systolic murmur which may represent both aortic as well as mitral valvular disease.  His echo Doppler study confirms normal systolic function and demonstrates moderate aortic valve stenosis with a mean gradient of 21 and a peak gradient of 36 mm.  His aortic valve area, however, calculated to 0.71 cm.  Presently, he is not taking his beta blocker therapy.  I suspect he has sick sinus syndrome in the etiology of his atrial fibrillation.  He is somewhat bradycardic still on just  very low-dose digoxin and I have recommended that he discontinue digoxin therapy.  He will notify us if he notes his heart rate getting fast.  With his aortic valve disease, I am recommending a follow-up echo Doppler study be obtained in September to make certain there is not significant progressive aortic valve stenosis.  At present, he is without chest pain, or signs of CHF and has normal systolic function.  He has chronic kidney disease, stage 3.   I will see him in follow-up of his echo Doppler study in October 2016 for further evaluation.  Troy Sine, MD, [facility] 04/24/2014 12:40 PM

## 2014-06-13 DIAGNOSIS — I4891 Unspecified atrial fibrillation: Secondary | ICD-10-CM | POA: Diagnosis not present

## 2014-06-13 DIAGNOSIS — Z125 Encounter for screening for malignant neoplasm of prostate: Secondary | ICD-10-CM | POA: Diagnosis not present

## 2014-06-13 DIAGNOSIS — D509 Iron deficiency anemia, unspecified: Secondary | ICD-10-CM | POA: Diagnosis not present

## 2014-06-13 DIAGNOSIS — Z79899 Other long term (current) drug therapy: Secondary | ICD-10-CM | POA: Diagnosis not present

## 2014-06-13 DIAGNOSIS — I482 Chronic atrial fibrillation: Secondary | ICD-10-CM | POA: Diagnosis not present

## 2014-06-20 DIAGNOSIS — Z23 Encounter for immunization: Secondary | ICD-10-CM | POA: Diagnosis not present

## 2014-06-20 DIAGNOSIS — Z6822 Body mass index (BMI) 22.0-22.9, adult: Secondary | ICD-10-CM | POA: Diagnosis not present

## 2014-06-20 DIAGNOSIS — D692 Other nonthrombocytopenic purpura: Secondary | ICD-10-CM | POA: Diagnosis not present

## 2014-06-20 DIAGNOSIS — D509 Iron deficiency anemia, unspecified: Secondary | ICD-10-CM | POA: Diagnosis not present

## 2014-06-20 DIAGNOSIS — I482 Chronic atrial fibrillation: Secondary | ICD-10-CM | POA: Diagnosis not present

## 2014-06-20 DIAGNOSIS — Z1389 Encounter for screening for other disorder: Secondary | ICD-10-CM | POA: Diagnosis not present

## 2014-06-20 DIAGNOSIS — R413 Other amnesia: Secondary | ICD-10-CM | POA: Diagnosis not present

## 2014-06-20 DIAGNOSIS — M1990 Unspecified osteoarthritis, unspecified site: Secondary | ICD-10-CM | POA: Diagnosis not present

## 2014-06-20 DIAGNOSIS — Z0000 Encounter for general adult medical examination without abnormal findings: Secondary | ICD-10-CM | POA: Diagnosis not present

## 2014-07-01 DIAGNOSIS — Z1212 Encounter for screening for malignant neoplasm of rectum: Secondary | ICD-10-CM | POA: Diagnosis not present

## 2014-08-11 DIAGNOSIS — B351 Tinea unguium: Secondary | ICD-10-CM | POA: Diagnosis not present

## 2014-08-11 DIAGNOSIS — I739 Peripheral vascular disease, unspecified: Secondary | ICD-10-CM | POA: Diagnosis not present

## 2014-08-11 DIAGNOSIS — L603 Nail dystrophy: Secondary | ICD-10-CM | POA: Diagnosis not present

## 2014-08-11 DIAGNOSIS — M79609 Pain in unspecified limb: Secondary | ICD-10-CM | POA: Diagnosis not present

## 2014-10-11 DIAGNOSIS — Z6822 Body mass index (BMI) 22.0-22.9, adult: Secondary | ICD-10-CM | POA: Diagnosis not present

## 2014-10-11 DIAGNOSIS — D509 Iron deficiency anemia, unspecified: Secondary | ICD-10-CM | POA: Diagnosis not present

## 2014-10-11 DIAGNOSIS — I482 Chronic atrial fibrillation: Secondary | ICD-10-CM | POA: Diagnosis not present

## 2014-10-31 DIAGNOSIS — M79609 Pain in unspecified limb: Secondary | ICD-10-CM | POA: Diagnosis not present

## 2014-10-31 DIAGNOSIS — I739 Peripheral vascular disease, unspecified: Secondary | ICD-10-CM | POA: Diagnosis not present

## 2014-10-31 DIAGNOSIS — L603 Nail dystrophy: Secondary | ICD-10-CM | POA: Diagnosis not present

## 2014-10-31 DIAGNOSIS — B351 Tinea unguium: Secondary | ICD-10-CM | POA: Diagnosis not present

## 2014-12-23 DIAGNOSIS — Z6822 Body mass index (BMI) 22.0-22.9, adult: Secondary | ICD-10-CM | POA: Diagnosis not present

## 2014-12-23 DIAGNOSIS — I482 Chronic atrial fibrillation: Secondary | ICD-10-CM | POA: Diagnosis not present

## 2014-12-23 DIAGNOSIS — R413 Other amnesia: Secondary | ICD-10-CM | POA: Diagnosis not present

## 2014-12-23 DIAGNOSIS — N183 Chronic kidney disease, stage 3 (moderate): Secondary | ICD-10-CM | POA: Diagnosis not present

## 2014-12-23 DIAGNOSIS — Z23 Encounter for immunization: Secondary | ICD-10-CM | POA: Diagnosis not present

## 2014-12-23 DIAGNOSIS — D692 Other nonthrombocytopenic purpura: Secondary | ICD-10-CM | POA: Diagnosis not present

## 2014-12-23 DIAGNOSIS — M1990 Unspecified osteoarthritis, unspecified site: Secondary | ICD-10-CM | POA: Diagnosis not present

## 2015-01-12 DIAGNOSIS — B351 Tinea unguium: Secondary | ICD-10-CM | POA: Diagnosis not present

## 2015-01-12 DIAGNOSIS — L603 Nail dystrophy: Secondary | ICD-10-CM | POA: Diagnosis not present

## 2015-01-12 DIAGNOSIS — I739 Peripheral vascular disease, unspecified: Secondary | ICD-10-CM | POA: Diagnosis not present

## 2015-01-12 DIAGNOSIS — M79609 Pain in unspecified limb: Secondary | ICD-10-CM | POA: Diagnosis not present

## 2015-02-22 DIAGNOSIS — J209 Acute bronchitis, unspecified: Secondary | ICD-10-CM | POA: Diagnosis not present

## 2015-02-22 DIAGNOSIS — R05 Cough: Secondary | ICD-10-CM | POA: Diagnosis not present

## 2015-02-22 DIAGNOSIS — Z6822 Body mass index (BMI) 22.0-22.9, adult: Secondary | ICD-10-CM | POA: Diagnosis not present

## 2015-02-22 DIAGNOSIS — R062 Wheezing: Secondary | ICD-10-CM | POA: Diagnosis not present

## 2015-04-11 DIAGNOSIS — N183 Chronic kidney disease, stage 3 (moderate): Secondary | ICD-10-CM | POA: Diagnosis not present

## 2015-04-11 DIAGNOSIS — D509 Iron deficiency anemia, unspecified: Secondary | ICD-10-CM | POA: Diagnosis not present

## 2015-04-11 DIAGNOSIS — Z6822 Body mass index (BMI) 22.0-22.9, adult: Secondary | ICD-10-CM | POA: Diagnosis not present

## 2015-04-11 DIAGNOSIS — Z79899 Other long term (current) drug therapy: Secondary | ICD-10-CM | POA: Diagnosis not present

## 2015-04-11 DIAGNOSIS — E039 Hypothyroidism, unspecified: Secondary | ICD-10-CM | POA: Diagnosis not present

## 2015-04-11 DIAGNOSIS — I482 Chronic atrial fibrillation: Secondary | ICD-10-CM | POA: Diagnosis not present

## 2015-05-05 DIAGNOSIS — R278 Other lack of coordination: Secondary | ICD-10-CM | POA: Diagnosis not present

## 2015-05-05 DIAGNOSIS — R2681 Unsteadiness on feet: Secondary | ICD-10-CM | POA: Diagnosis not present

## 2015-05-05 DIAGNOSIS — R260 Ataxic gait: Secondary | ICD-10-CM | POA: Diagnosis not present

## 2015-05-05 DIAGNOSIS — N393 Stress incontinence (female) (male): Secondary | ICD-10-CM | POA: Diagnosis not present

## 2015-05-05 DIAGNOSIS — M6281 Muscle weakness (generalized): Secondary | ICD-10-CM | POA: Diagnosis not present

## 2015-05-08 DIAGNOSIS — R2681 Unsteadiness on feet: Secondary | ICD-10-CM | POA: Diagnosis not present

## 2015-05-08 DIAGNOSIS — N393 Stress incontinence (female) (male): Secondary | ICD-10-CM | POA: Diagnosis not present

## 2015-05-08 DIAGNOSIS — M6281 Muscle weakness (generalized): Secondary | ICD-10-CM | POA: Diagnosis not present

## 2015-05-08 DIAGNOSIS — R260 Ataxic gait: Secondary | ICD-10-CM | POA: Diagnosis not present

## 2015-05-08 DIAGNOSIS — R278 Other lack of coordination: Secondary | ICD-10-CM | POA: Diagnosis not present

## 2015-05-09 DIAGNOSIS — N393 Stress incontinence (female) (male): Secondary | ICD-10-CM | POA: Diagnosis not present

## 2015-05-09 DIAGNOSIS — M6281 Muscle weakness (generalized): Secondary | ICD-10-CM | POA: Diagnosis not present

## 2015-05-09 DIAGNOSIS — R260 Ataxic gait: Secondary | ICD-10-CM | POA: Diagnosis not present

## 2015-05-09 DIAGNOSIS — R278 Other lack of coordination: Secondary | ICD-10-CM | POA: Diagnosis not present

## 2015-05-09 DIAGNOSIS — R2681 Unsteadiness on feet: Secondary | ICD-10-CM | POA: Diagnosis not present

## 2015-05-10 DIAGNOSIS — M6281 Muscle weakness (generalized): Secondary | ICD-10-CM | POA: Diagnosis not present

## 2015-05-10 DIAGNOSIS — R2681 Unsteadiness on feet: Secondary | ICD-10-CM | POA: Diagnosis not present

## 2015-05-10 DIAGNOSIS — R278 Other lack of coordination: Secondary | ICD-10-CM | POA: Diagnosis not present

## 2015-05-10 DIAGNOSIS — R260 Ataxic gait: Secondary | ICD-10-CM | POA: Diagnosis not present

## 2015-05-10 DIAGNOSIS — N393 Stress incontinence (female) (male): Secondary | ICD-10-CM | POA: Diagnosis not present

## 2015-05-11 DIAGNOSIS — R2681 Unsteadiness on feet: Secondary | ICD-10-CM | POA: Diagnosis not present

## 2015-05-11 DIAGNOSIS — R260 Ataxic gait: Secondary | ICD-10-CM | POA: Diagnosis not present

## 2015-05-11 DIAGNOSIS — M6281 Muscle weakness (generalized): Secondary | ICD-10-CM | POA: Diagnosis not present

## 2015-05-11 DIAGNOSIS — R278 Other lack of coordination: Secondary | ICD-10-CM | POA: Diagnosis not present

## 2015-05-11 DIAGNOSIS — N393 Stress incontinence (female) (male): Secondary | ICD-10-CM | POA: Diagnosis not present

## 2015-05-12 DIAGNOSIS — R260 Ataxic gait: Secondary | ICD-10-CM | POA: Diagnosis not present

## 2015-05-12 DIAGNOSIS — N393 Stress incontinence (female) (male): Secondary | ICD-10-CM | POA: Diagnosis not present

## 2015-05-12 DIAGNOSIS — R2681 Unsteadiness on feet: Secondary | ICD-10-CM | POA: Diagnosis not present

## 2015-05-12 DIAGNOSIS — M6281 Muscle weakness (generalized): Secondary | ICD-10-CM | POA: Diagnosis not present

## 2015-05-12 DIAGNOSIS — R278 Other lack of coordination: Secondary | ICD-10-CM | POA: Diagnosis not present

## 2015-05-15 DIAGNOSIS — N393 Stress incontinence (female) (male): Secondary | ICD-10-CM | POA: Diagnosis not present

## 2015-05-15 DIAGNOSIS — R2681 Unsteadiness on feet: Secondary | ICD-10-CM | POA: Diagnosis not present

## 2015-05-15 DIAGNOSIS — M6281 Muscle weakness (generalized): Secondary | ICD-10-CM | POA: Diagnosis not present

## 2015-05-15 DIAGNOSIS — R278 Other lack of coordination: Secondary | ICD-10-CM | POA: Diagnosis not present

## 2015-05-15 DIAGNOSIS — R260 Ataxic gait: Secondary | ICD-10-CM | POA: Diagnosis not present

## 2015-05-17 DIAGNOSIS — R2681 Unsteadiness on feet: Secondary | ICD-10-CM | POA: Diagnosis not present

## 2015-05-17 DIAGNOSIS — N393 Stress incontinence (female) (male): Secondary | ICD-10-CM | POA: Diagnosis not present

## 2015-05-17 DIAGNOSIS — R278 Other lack of coordination: Secondary | ICD-10-CM | POA: Diagnosis not present

## 2015-05-17 DIAGNOSIS — R260 Ataxic gait: Secondary | ICD-10-CM | POA: Diagnosis not present

## 2015-05-17 DIAGNOSIS — M6281 Muscle weakness (generalized): Secondary | ICD-10-CM | POA: Diagnosis not present

## 2015-05-18 DIAGNOSIS — N393 Stress incontinence (female) (male): Secondary | ICD-10-CM | POA: Diagnosis not present

## 2015-05-18 DIAGNOSIS — R2681 Unsteadiness on feet: Secondary | ICD-10-CM | POA: Diagnosis not present

## 2015-05-18 DIAGNOSIS — R278 Other lack of coordination: Secondary | ICD-10-CM | POA: Diagnosis not present

## 2015-05-18 DIAGNOSIS — R260 Ataxic gait: Secondary | ICD-10-CM | POA: Diagnosis not present

## 2015-05-18 DIAGNOSIS — M6281 Muscle weakness (generalized): Secondary | ICD-10-CM | POA: Diagnosis not present

## 2015-05-19 DIAGNOSIS — N393 Stress incontinence (female) (male): Secondary | ICD-10-CM | POA: Diagnosis not present

## 2015-05-19 DIAGNOSIS — M6281 Muscle weakness (generalized): Secondary | ICD-10-CM | POA: Diagnosis not present

## 2015-05-19 DIAGNOSIS — R2681 Unsteadiness on feet: Secondary | ICD-10-CM | POA: Diagnosis not present

## 2015-05-19 DIAGNOSIS — R260 Ataxic gait: Secondary | ICD-10-CM | POA: Diagnosis not present

## 2015-05-19 DIAGNOSIS — R278 Other lack of coordination: Secondary | ICD-10-CM | POA: Diagnosis not present

## 2015-05-22 DIAGNOSIS — N393 Stress incontinence (female) (male): Secondary | ICD-10-CM | POA: Diagnosis not present

## 2015-05-22 DIAGNOSIS — R2681 Unsteadiness on feet: Secondary | ICD-10-CM | POA: Diagnosis not present

## 2015-05-22 DIAGNOSIS — M6281 Muscle weakness (generalized): Secondary | ICD-10-CM | POA: Diagnosis not present

## 2015-05-22 DIAGNOSIS — R260 Ataxic gait: Secondary | ICD-10-CM | POA: Diagnosis not present

## 2015-05-22 DIAGNOSIS — R278 Other lack of coordination: Secondary | ICD-10-CM | POA: Diagnosis not present

## 2015-05-23 DIAGNOSIS — M6281 Muscle weakness (generalized): Secondary | ICD-10-CM | POA: Diagnosis not present

## 2015-05-23 DIAGNOSIS — R278 Other lack of coordination: Secondary | ICD-10-CM | POA: Diagnosis not present

## 2015-05-23 DIAGNOSIS — N393 Stress incontinence (female) (male): Secondary | ICD-10-CM | POA: Diagnosis not present

## 2015-05-23 DIAGNOSIS — K4090 Unilateral inguinal hernia, without obstruction or gangrene, not specified as recurrent: Secondary | ICD-10-CM | POA: Diagnosis not present

## 2015-05-23 DIAGNOSIS — R260 Ataxic gait: Secondary | ICD-10-CM | POA: Diagnosis not present

## 2015-05-23 DIAGNOSIS — R2681 Unsteadiness on feet: Secondary | ICD-10-CM | POA: Diagnosis not present

## 2015-05-24 DIAGNOSIS — R2681 Unsteadiness on feet: Secondary | ICD-10-CM | POA: Diagnosis not present

## 2015-05-24 DIAGNOSIS — M6281 Muscle weakness (generalized): Secondary | ICD-10-CM | POA: Diagnosis not present

## 2015-05-24 DIAGNOSIS — N393 Stress incontinence (female) (male): Secondary | ICD-10-CM | POA: Diagnosis not present

## 2015-05-24 DIAGNOSIS — R260 Ataxic gait: Secondary | ICD-10-CM | POA: Diagnosis not present

## 2015-05-24 DIAGNOSIS — R278 Other lack of coordination: Secondary | ICD-10-CM | POA: Diagnosis not present

## 2015-05-25 DIAGNOSIS — R2681 Unsteadiness on feet: Secondary | ICD-10-CM | POA: Diagnosis not present

## 2015-05-25 DIAGNOSIS — N393 Stress incontinence (female) (male): Secondary | ICD-10-CM | POA: Diagnosis not present

## 2015-05-25 DIAGNOSIS — R260 Ataxic gait: Secondary | ICD-10-CM | POA: Diagnosis not present

## 2015-05-25 DIAGNOSIS — R278 Other lack of coordination: Secondary | ICD-10-CM | POA: Diagnosis not present

## 2015-05-25 DIAGNOSIS — M6281 Muscle weakness (generalized): Secondary | ICD-10-CM | POA: Diagnosis not present

## 2015-05-29 DIAGNOSIS — M6281 Muscle weakness (generalized): Secondary | ICD-10-CM | POA: Diagnosis not present

## 2015-05-29 DIAGNOSIS — R278 Other lack of coordination: Secondary | ICD-10-CM | POA: Diagnosis not present

## 2015-05-29 DIAGNOSIS — N393 Stress incontinence (female) (male): Secondary | ICD-10-CM | POA: Diagnosis not present

## 2015-05-29 DIAGNOSIS — R2681 Unsteadiness on feet: Secondary | ICD-10-CM | POA: Diagnosis not present

## 2015-05-29 DIAGNOSIS — R260 Ataxic gait: Secondary | ICD-10-CM | POA: Diagnosis not present

## 2015-05-31 DIAGNOSIS — R278 Other lack of coordination: Secondary | ICD-10-CM | POA: Diagnosis not present

## 2015-05-31 DIAGNOSIS — M6281 Muscle weakness (generalized): Secondary | ICD-10-CM | POA: Diagnosis not present

## 2015-05-31 DIAGNOSIS — N393 Stress incontinence (female) (male): Secondary | ICD-10-CM | POA: Diagnosis not present

## 2015-05-31 DIAGNOSIS — R2681 Unsteadiness on feet: Secondary | ICD-10-CM | POA: Diagnosis not present

## 2015-05-31 DIAGNOSIS — R260 Ataxic gait: Secondary | ICD-10-CM | POA: Diagnosis not present

## 2015-06-01 DIAGNOSIS — N393 Stress incontinence (female) (male): Secondary | ICD-10-CM | POA: Diagnosis not present

## 2015-06-01 DIAGNOSIS — R260 Ataxic gait: Secondary | ICD-10-CM | POA: Diagnosis not present

## 2015-06-01 DIAGNOSIS — R2681 Unsteadiness on feet: Secondary | ICD-10-CM | POA: Diagnosis not present

## 2015-06-01 DIAGNOSIS — M6281 Muscle weakness (generalized): Secondary | ICD-10-CM | POA: Diagnosis not present

## 2015-06-01 DIAGNOSIS — R278 Other lack of coordination: Secondary | ICD-10-CM | POA: Diagnosis not present

## 2015-06-02 DIAGNOSIS — R278 Other lack of coordination: Secondary | ICD-10-CM | POA: Diagnosis not present

## 2015-06-02 DIAGNOSIS — R2681 Unsteadiness on feet: Secondary | ICD-10-CM | POA: Diagnosis not present

## 2015-06-02 DIAGNOSIS — N393 Stress incontinence (female) (male): Secondary | ICD-10-CM | POA: Diagnosis not present

## 2015-06-02 DIAGNOSIS — R260 Ataxic gait: Secondary | ICD-10-CM | POA: Diagnosis not present

## 2015-06-02 DIAGNOSIS — M6281 Muscle weakness (generalized): Secondary | ICD-10-CM | POA: Diagnosis not present

## 2015-06-06 DIAGNOSIS — R278 Other lack of coordination: Secondary | ICD-10-CM | POA: Diagnosis not present

## 2015-06-06 DIAGNOSIS — R2681 Unsteadiness on feet: Secondary | ICD-10-CM | POA: Diagnosis not present

## 2015-06-06 DIAGNOSIS — N393 Stress incontinence (female) (male): Secondary | ICD-10-CM | POA: Diagnosis not present

## 2015-06-06 DIAGNOSIS — R260 Ataxic gait: Secondary | ICD-10-CM | POA: Diagnosis not present

## 2015-06-06 DIAGNOSIS — M6281 Muscle weakness (generalized): Secondary | ICD-10-CM | POA: Diagnosis not present

## 2015-06-08 DIAGNOSIS — R260 Ataxic gait: Secondary | ICD-10-CM | POA: Diagnosis not present

## 2015-06-08 DIAGNOSIS — R2681 Unsteadiness on feet: Secondary | ICD-10-CM | POA: Diagnosis not present

## 2015-06-08 DIAGNOSIS — M6281 Muscle weakness (generalized): Secondary | ICD-10-CM | POA: Diagnosis not present

## 2015-06-08 DIAGNOSIS — R278 Other lack of coordination: Secondary | ICD-10-CM | POA: Diagnosis not present

## 2015-06-08 DIAGNOSIS — N393 Stress incontinence (female) (male): Secondary | ICD-10-CM | POA: Diagnosis not present

## 2015-06-12 DIAGNOSIS — N393 Stress incontinence (female) (male): Secondary | ICD-10-CM | POA: Diagnosis not present

## 2015-06-12 DIAGNOSIS — R2681 Unsteadiness on feet: Secondary | ICD-10-CM | POA: Diagnosis not present

## 2015-06-12 DIAGNOSIS — R278 Other lack of coordination: Secondary | ICD-10-CM | POA: Diagnosis not present

## 2015-06-12 DIAGNOSIS — R062 Wheezing: Secondary | ICD-10-CM | POA: Diagnosis not present

## 2015-06-12 DIAGNOSIS — M6281 Muscle weakness (generalized): Secondary | ICD-10-CM | POA: Diagnosis not present

## 2015-06-12 DIAGNOSIS — R260 Ataxic gait: Secondary | ICD-10-CM | POA: Diagnosis not present

## 2015-06-13 DIAGNOSIS — R260 Ataxic gait: Secondary | ICD-10-CM | POA: Diagnosis not present

## 2015-06-13 DIAGNOSIS — R278 Other lack of coordination: Secondary | ICD-10-CM | POA: Diagnosis not present

## 2015-06-13 DIAGNOSIS — R2681 Unsteadiness on feet: Secondary | ICD-10-CM | POA: Diagnosis not present

## 2015-06-13 DIAGNOSIS — N393 Stress incontinence (female) (male): Secondary | ICD-10-CM | POA: Diagnosis not present

## 2015-06-13 DIAGNOSIS — M6281 Muscle weakness (generalized): Secondary | ICD-10-CM | POA: Diagnosis not present

## 2015-06-14 DIAGNOSIS — N393 Stress incontinence (female) (male): Secondary | ICD-10-CM | POA: Diagnosis not present

## 2015-06-14 DIAGNOSIS — M6281 Muscle weakness (generalized): Secondary | ICD-10-CM | POA: Diagnosis not present

## 2015-06-14 DIAGNOSIS — R2681 Unsteadiness on feet: Secondary | ICD-10-CM | POA: Diagnosis not present

## 2015-06-14 DIAGNOSIS — R260 Ataxic gait: Secondary | ICD-10-CM | POA: Diagnosis not present

## 2015-06-14 DIAGNOSIS — R278 Other lack of coordination: Secondary | ICD-10-CM | POA: Diagnosis not present

## 2015-06-16 DIAGNOSIS — N393 Stress incontinence (female) (male): Secondary | ICD-10-CM | POA: Diagnosis not present

## 2015-06-16 DIAGNOSIS — R278 Other lack of coordination: Secondary | ICD-10-CM | POA: Diagnosis not present

## 2015-06-16 DIAGNOSIS — R260 Ataxic gait: Secondary | ICD-10-CM | POA: Diagnosis not present

## 2015-06-16 DIAGNOSIS — R2681 Unsteadiness on feet: Secondary | ICD-10-CM | POA: Diagnosis not present

## 2015-06-16 DIAGNOSIS — M6281 Muscle weakness (generalized): Secondary | ICD-10-CM | POA: Diagnosis not present

## 2015-06-21 DIAGNOSIS — R2681 Unsteadiness on feet: Secondary | ICD-10-CM | POA: Diagnosis not present

## 2015-06-21 DIAGNOSIS — R260 Ataxic gait: Secondary | ICD-10-CM | POA: Diagnosis not present

## 2015-06-21 DIAGNOSIS — M6281 Muscle weakness (generalized): Secondary | ICD-10-CM | POA: Diagnosis not present

## 2015-06-21 DIAGNOSIS — N393 Stress incontinence (female) (male): Secondary | ICD-10-CM | POA: Diagnosis not present

## 2015-06-21 DIAGNOSIS — R278 Other lack of coordination: Secondary | ICD-10-CM | POA: Diagnosis not present

## 2015-06-23 DIAGNOSIS — N393 Stress incontinence (female) (male): Secondary | ICD-10-CM | POA: Diagnosis not present

## 2015-06-23 DIAGNOSIS — R278 Other lack of coordination: Secondary | ICD-10-CM | POA: Diagnosis not present

## 2015-06-23 DIAGNOSIS — R260 Ataxic gait: Secondary | ICD-10-CM | POA: Diagnosis not present

## 2015-06-23 DIAGNOSIS — R2681 Unsteadiness on feet: Secondary | ICD-10-CM | POA: Diagnosis not present

## 2015-06-23 DIAGNOSIS — M6281 Muscle weakness (generalized): Secondary | ICD-10-CM | POA: Diagnosis not present

## 2015-06-26 DIAGNOSIS — R260 Ataxic gait: Secondary | ICD-10-CM | POA: Diagnosis not present

## 2015-06-26 DIAGNOSIS — R278 Other lack of coordination: Secondary | ICD-10-CM | POA: Diagnosis not present

## 2015-06-26 DIAGNOSIS — M6281 Muscle weakness (generalized): Secondary | ICD-10-CM | POA: Diagnosis not present

## 2015-06-26 DIAGNOSIS — R2681 Unsteadiness on feet: Secondary | ICD-10-CM | POA: Diagnosis not present

## 2015-06-26 DIAGNOSIS — N393 Stress incontinence (female) (male): Secondary | ICD-10-CM | POA: Diagnosis not present

## 2015-06-28 DIAGNOSIS — R278 Other lack of coordination: Secondary | ICD-10-CM | POA: Diagnosis not present

## 2015-06-28 DIAGNOSIS — M6281 Muscle weakness (generalized): Secondary | ICD-10-CM | POA: Diagnosis not present

## 2015-06-28 DIAGNOSIS — R260 Ataxic gait: Secondary | ICD-10-CM | POA: Diagnosis not present

## 2015-06-28 DIAGNOSIS — N393 Stress incontinence (female) (male): Secondary | ICD-10-CM | POA: Diagnosis not present

## 2015-06-28 DIAGNOSIS — R2681 Unsteadiness on feet: Secondary | ICD-10-CM | POA: Diagnosis not present

## 2015-06-29 DIAGNOSIS — R260 Ataxic gait: Secondary | ICD-10-CM | POA: Diagnosis not present

## 2015-06-29 DIAGNOSIS — R278 Other lack of coordination: Secondary | ICD-10-CM | POA: Diagnosis not present

## 2015-06-29 DIAGNOSIS — N393 Stress incontinence (female) (male): Secondary | ICD-10-CM | POA: Diagnosis not present

## 2015-06-29 DIAGNOSIS — M6281 Muscle weakness (generalized): Secondary | ICD-10-CM | POA: Diagnosis not present

## 2015-06-29 DIAGNOSIS — R2681 Unsteadiness on feet: Secondary | ICD-10-CM | POA: Diagnosis not present

## 2015-07-03 DIAGNOSIS — D500 Iron deficiency anemia secondary to blood loss (chronic): Secondary | ICD-10-CM

## 2015-07-03 HISTORY — DX: Iron deficiency anemia secondary to blood loss (chronic): D50.0

## 2015-07-04 DIAGNOSIS — R2681 Unsteadiness on feet: Secondary | ICD-10-CM | POA: Diagnosis not present

## 2015-07-04 DIAGNOSIS — M6281 Muscle weakness (generalized): Secondary | ICD-10-CM | POA: Diagnosis not present

## 2015-07-04 DIAGNOSIS — R260 Ataxic gait: Secondary | ICD-10-CM | POA: Diagnosis not present

## 2015-07-06 DIAGNOSIS — R260 Ataxic gait: Secondary | ICD-10-CM | POA: Diagnosis not present

## 2015-07-06 DIAGNOSIS — M6281 Muscle weakness (generalized): Secondary | ICD-10-CM | POA: Diagnosis not present

## 2015-07-06 DIAGNOSIS — R2681 Unsteadiness on feet: Secondary | ICD-10-CM | POA: Diagnosis not present

## 2015-07-11 DIAGNOSIS — R260 Ataxic gait: Secondary | ICD-10-CM | POA: Diagnosis not present

## 2015-07-11 DIAGNOSIS — R2681 Unsteadiness on feet: Secondary | ICD-10-CM | POA: Diagnosis not present

## 2015-07-11 DIAGNOSIS — M6281 Muscle weakness (generalized): Secondary | ICD-10-CM | POA: Diagnosis not present

## 2015-07-13 DIAGNOSIS — R260 Ataxic gait: Secondary | ICD-10-CM | POA: Diagnosis not present

## 2015-07-13 DIAGNOSIS — R2681 Unsteadiness on feet: Secondary | ICD-10-CM | POA: Diagnosis not present

## 2015-07-13 DIAGNOSIS — M6281 Muscle weakness (generalized): Secondary | ICD-10-CM | POA: Diagnosis not present

## 2015-07-19 DIAGNOSIS — R2681 Unsteadiness on feet: Secondary | ICD-10-CM | POA: Diagnosis not present

## 2015-07-19 DIAGNOSIS — R260 Ataxic gait: Secondary | ICD-10-CM | POA: Diagnosis not present

## 2015-07-19 DIAGNOSIS — M6281 Muscle weakness (generalized): Secondary | ICD-10-CM | POA: Diagnosis not present

## 2015-07-20 DIAGNOSIS — R2681 Unsteadiness on feet: Secondary | ICD-10-CM | POA: Diagnosis not present

## 2015-07-20 DIAGNOSIS — R260 Ataxic gait: Secondary | ICD-10-CM | POA: Diagnosis not present

## 2015-07-20 DIAGNOSIS — M6281 Muscle weakness (generalized): Secondary | ICD-10-CM | POA: Diagnosis not present

## 2015-07-25 DIAGNOSIS — R2681 Unsteadiness on feet: Secondary | ICD-10-CM | POA: Diagnosis not present

## 2015-07-25 DIAGNOSIS — M6281 Muscle weakness (generalized): Secondary | ICD-10-CM | POA: Diagnosis not present

## 2015-07-25 DIAGNOSIS — R260 Ataxic gait: Secondary | ICD-10-CM | POA: Diagnosis not present

## 2015-07-27 DIAGNOSIS — R2681 Unsteadiness on feet: Secondary | ICD-10-CM | POA: Diagnosis not present

## 2015-07-27 DIAGNOSIS — R260 Ataxic gait: Secondary | ICD-10-CM | POA: Diagnosis not present

## 2015-07-27 DIAGNOSIS — M6281 Muscle weakness (generalized): Secondary | ICD-10-CM | POA: Diagnosis not present

## 2015-07-28 ENCOUNTER — Encounter (HOSPITAL_COMMUNITY): Payer: Self-pay | Admitting: General Practice

## 2015-07-28 ENCOUNTER — Inpatient Hospital Stay (HOSPITAL_COMMUNITY): Payer: Medicare Other

## 2015-07-28 ENCOUNTER — Inpatient Hospital Stay (HOSPITAL_COMMUNITY)
Admission: AD | Admit: 2015-07-28 | Discharge: 2015-07-31 | DRG: 812 | Disposition: A | Discharge status: Home / Self Care | Payer: Medicare Other | Source: Ambulatory Visit | Attending: Internal Medicine | Admitting: Internal Medicine

## 2015-07-28 DIAGNOSIS — K2960 Other gastritis without bleeding: Secondary | ICD-10-CM | POA: Diagnosis present

## 2015-07-28 DIAGNOSIS — F0280 Dementia in other diseases classified elsewhere without behavioral disturbance: Secondary | ICD-10-CM | POA: Diagnosis present

## 2015-07-28 DIAGNOSIS — Z6822 Body mass index (BMI) 22.0-22.9, adult: Secondary | ICD-10-CM | POA: Diagnosis not present

## 2015-07-28 DIAGNOSIS — Z903 Acquired absence of stomach [part of]: Secondary | ICD-10-CM

## 2015-07-28 DIAGNOSIS — N1831 Chronic kidney disease, stage 3a: Secondary | ICD-10-CM | POA: Diagnosis present

## 2015-07-28 DIAGNOSIS — D500 Iron deficiency anemia secondary to blood loss (chronic): Secondary | ICD-10-CM | POA: Diagnosis present

## 2015-07-28 DIAGNOSIS — I480 Paroxysmal atrial fibrillation: Secondary | ICD-10-CM | POA: Diagnosis present

## 2015-07-28 DIAGNOSIS — G309 Alzheimer's disease, unspecified: Secondary | ICD-10-CM | POA: Diagnosis not present

## 2015-07-28 DIAGNOSIS — D509 Iron deficiency anemia, unspecified: Secondary | ICD-10-CM | POA: Diagnosis not present

## 2015-07-28 DIAGNOSIS — R0600 Dyspnea, unspecified: Secondary | ICD-10-CM | POA: Diagnosis not present

## 2015-07-28 DIAGNOSIS — R413 Other amnesia: Secondary | ICD-10-CM | POA: Diagnosis not present

## 2015-07-28 DIAGNOSIS — Z79899 Other long term (current) drug therapy: Secondary | ICD-10-CM

## 2015-07-28 DIAGNOSIS — I350 Nonrheumatic aortic (valve) stenosis: Secondary | ICD-10-CM

## 2015-07-28 DIAGNOSIS — E039 Hypothyroidism, unspecified: Secondary | ICD-10-CM | POA: Diagnosis present

## 2015-07-28 DIAGNOSIS — R0609 Other forms of dyspnea: Secondary | ICD-10-CM

## 2015-07-28 DIAGNOSIS — R195 Other fecal abnormalities: Secondary | ICD-10-CM | POA: Diagnosis not present

## 2015-07-28 DIAGNOSIS — N183 Chronic kidney disease, stage 3 (moderate): Secondary | ICD-10-CM | POA: Diagnosis not present

## 2015-07-28 DIAGNOSIS — I482 Chronic atrial fibrillation: Secondary | ICD-10-CM | POA: Diagnosis not present

## 2015-07-28 HISTORY — DX: Alzheimer's disease, unspecified: G30.9

## 2015-07-28 HISTORY — DX: Dementia in other diseases classified elsewhere, unspecified severity, without behavioral disturbance, psychotic disturbance, mood disturbance, and anxiety: F02.80

## 2015-07-28 HISTORY — DX: Chronic kidney disease, unspecified: N18.9

## 2015-07-28 HISTORY — DX: Other forms of dyspnea: R06.09

## 2015-07-28 HISTORY — DX: Dyspnea, unspecified: R06.00

## 2015-07-28 HISTORY — DX: Iron deficiency anemia secondary to blood loss (chronic): D50.0

## 2015-07-28 HISTORY — DX: Cholecystitis, unspecified: K81.9

## 2015-07-28 LAB — COMPREHENSIVE METABOLIC PANEL
ALBUMIN: 3.1 g/dL — AB (ref 3.5–5.0)
ALK PHOS: 65 U/L (ref 38–126)
ALT: 13 U/L — ABNORMAL LOW (ref 17–63)
AST: 16 U/L (ref 15–41)
Anion gap: 7 (ref 5–15)
BILIRUBIN TOTAL: 0.6 mg/dL (ref 0.3–1.2)
BUN: 20 mg/dL (ref 6–20)
CALCIUM: 8.7 mg/dL — AB (ref 8.9–10.3)
CO2: 23 mmol/L (ref 22–32)
CREATININE: 1.86 mg/dL — AB (ref 0.61–1.24)
Chloride: 107 mmol/L (ref 101–111)
GFR calc Af Amer: 35 mL/min — ABNORMAL LOW (ref >60)
GFR, EST NON AFRICAN AMERICAN: 30 mL/min — AB (ref >60)
GLUCOSE: 98 mg/dL (ref 65–99)
POTASSIUM: 4.4 mmol/L (ref 3.5–5.1)
Sodium: 137 mmol/L (ref 135–145)
TOTAL PROTEIN: 5.3 g/dL — AB (ref 6.5–8.1)

## 2015-07-28 LAB — HEMOGLOBIN AND HEMATOCRIT, BLOOD
HEMATOCRIT: 25.6 % — AB (ref 39.0–52.0)
Hemoglobin: 7.7 g/dL — ABNORMAL LOW (ref 13.0–17.0)

## 2015-07-28 LAB — PREPARE RBC (CROSSMATCH)

## 2015-07-28 LAB — CBC
HEMATOCRIT: 23.1 % — AB (ref 39.0–52.0)
Hemoglobin: 6.9 g/dL — CL (ref 13.0–17.0)
MCH: 19.5 pg — ABNORMAL LOW (ref 26.0–34.0)
MCHC: 29.9 g/dL — AB (ref 30.0–36.0)
MCV: 65.4 fL — ABNORMAL LOW (ref 78.0–100.0)
PLATELETS: 265 10*3/uL (ref 150–400)
RBC: 3.53 MIL/uL — ABNORMAL LOW (ref 4.22–5.81)
RDW: 18.7 % — AB (ref 11.5–15.5)
WBC: 5.7 10*3/uL (ref 4.0–10.5)

## 2015-07-28 LAB — ECHOCARDIOGRAM COMPLETE

## 2015-07-28 LAB — PROTIME-INR
INR: 1.18 (ref 0.00–1.49)
PROTHROMBIN TIME: 15.2 s (ref 11.6–15.2)

## 2015-07-28 LAB — ABO/RH: ABO/RH(D): A NEG

## 2015-07-28 MED ORDER — FUROSEMIDE 10 MG/ML IJ SOLN
20.0000 mg | Freq: Once | INTRAMUSCULAR | Status: AC
  Administered 2015-07-28: 20 mg via INTRAVENOUS
  Filled 2015-07-28: qty 2

## 2015-07-28 MED ORDER — LEVOTHYROXINE SODIUM 50 MCG PO TABS
50.0000 ug | ORAL_TABLET | Freq: Every day | ORAL | Status: DC
  Administered 2015-07-29 – 2015-07-31 (×3): 50 ug via ORAL
  Filled 2015-07-28 (×3): qty 1

## 2015-07-28 MED ORDER — HYDRALAZINE HCL 20 MG/ML IJ SOLN: 10.0000 mg | Freq: Four times a day (QID) | INTRAMUSCULAR | Status: DC | PRN

## 2015-07-28 MED ORDER — SODIUM CHLORIDE 0.9% FLUSH
3.0000 mL | Freq: Two times a day (BID) | INTRAVENOUS | Status: DC
  Administered 2015-07-28 – 2015-07-30 (×5): 3 mL via INTRAVENOUS

## 2015-07-28 MED ORDER — PANTOPRAZOLE SODIUM 40 MG IV SOLR
40.0000 mg | Freq: Two times a day (BID) | INTRAVENOUS | Status: DC
  Administered 2015-07-28 – 2015-07-30 (×4): 40 mg via INTRAVENOUS
  Filled 2015-07-28 (×3): qty 40

## 2015-07-28 MED ORDER — SODIUM CHLORIDE 0.9 % IV SOLN
Freq: Once | INTRAVENOUS | Status: AC
  Administered 2015-07-28: 16:00:00 via INTRAVENOUS

## 2015-07-28 NOTE — H&P (Addendum)
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 Brigitte Pulse) office with dyspnea on exertion and found to have Hg at 7.0 with hemoccult positive but no active bleeding. Referred to hospital admission for blood TF and evaluation.  -Pt reports dyspnea on exertion for several days, denies chest pains, no acute SOB, no cough. He had very mild dizziness. He denies recent bleedings, no abdominal pains, no hematochezia, no hematemesis. He reports h/o right inguinal hernia related intermittent discomforts. Reports taking several doses of Advil last week with no abdominal pains. He denies focal neuro symptoms.    Review of Systems: The patient denies anorexia, fever, weight loss,, vision loss, decreased hearing, hoarseness, chest pain, syncope, dyspnea on exertion, peripheral edema, balance deficits, hemoptysis, abdominal pain, melena, hematochezia, severe indigestion/heartburn, hematuria, incontinence, genital sores, muscle weakness, suspicious skin lesions, transient blindness, difficulty walking, depression, unusual weight change, abnormal bleeding, enlarged lymph nodes, angioedema, and breast masses.    Past Medical History  Diagnosis Date  . Atrial fibrillation   . Dementia   . Chronic kidney disease    Past Surgical History  Procedure Laterality Date  . Abdominal surgery      part of stomach removed  . Partial gastrectomy Bilateral   . Cholecystectomy N/A 04/27/2013    Procedure: LAPAROSCOPIC CHOLECYSTECTOMY WITH INTRAOPERATIVE CHOLANGIOGRAM;  Surgeon: Imogene Burn. [name], MD;  Location: [place];  Service: General;  Laterality: N/A;  . Laparoscopic  lysis of adhesions N/A 04/27/2013    Procedure: LAPAROSCOPIC LYSIS OF ADHESIONS;  Surgeon: Imogene Burn. [name], MD;  Location: [place];  Service: General;  Laterality: N/A;   Social History:  reports that he has never smoked. He does not have any smokeless tobacco history on file. He reports that he does not drink alcohol or use illicit drugs. ALF;  where does patient live--home, ALF, SNF? and with whom if at home? Yes;  Can patient participate in ADLs?  Allergies  Allergen Reactions  . Ceftriaxone Other (See Comments)    No reaction listed  . Cephalosporins     Unknown, previously documented in [facility], pt does not recall reactions  . Latex     No family history on file. no h/o CAD (be sure to complete)  Prior to Admission medications   Medication Sig Start Date End Date Taking? Authorizing Provider  Calcium-Phosphorus-Vitamin D (CITRACAL +D3 PO) Take 1 tablet by mouth.   Yes Historical Provider, MD  Cholecalciferol (VITAMIN D-3) 1000 UNITS CAPS Take by mouth.   Yes Historical Provider, MD  levothyroxine (SYNTHROID, LEVOTHROID) 50 MCG tablet Take 1 tablet by mouth daily. 01/28/14  Yes Historical Provider, MD  SAVAYSA 30 MG TABS tablet Take 1 tablet by mouth daily. 02/11/14  Yes Historical Provider, MD   Physical Exam: Filed Vitals:   07/28/15 1112  BP: 161/75  Pulse: 64  Resp: 18     General:  Alert, no distress   Eyes: eom-i, perrla   ENT: no oral ulcer   Neck: supple, no JVD  Cardiovascular: s1, poor s2, rrr, systolic murmur   Respiratory: CTA BL  Abdomen: soft, nt, nd, right inguinal hernia   Skin: few ecchymosis   Musculoskeletal: pedal edema  Psychiatric: no hallucinations  Neurologic: cn 2-12 intact, motor 5/5 BL symmetric   Labs on Admission:  Basic Metabolic Panel: No results for input(s): NA, K, CL, CO2, GLUCOSE, BUN, CREATININE, CALCIUM, MG, PHOS in the last 168 hours. Liver Function Tests: No results for input(s): AST, ALT, ALKPHOS, BILITOT, PROT,  ALBUMIN in the last 168 hours. No results for input(s): LIPASE, AMYLASE in the last 168 hours. No results for input(s): AMMONIA in the last 168 hours. CBC: No results for input(s): WBC, NEUTROABS, HGB, HCT, MCV, PLT in the last 168 hours. Cardiac Enzymes: No results for input(s): CKTOTAL, CKMB, CKMBINDEX, TROPONINI in the last 168 hours.  BNP (last 3 results) No results for input(s): BNP in the last 8760 hours.  ProBNP (last 3 results) No results for input(s): PROBNP in the last 8760 hours.  CBG: No results for input(s): GLUCAP in the last 168 hours.  Radiological Exams on Admission: No results found.  EKG: Independently reviewed.   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 Brigitte Pulse) office with dyspnea on exertion and found to have Hg at 7.0  Symptomatic Anemia. Suspected chronic blood loss anemia. Hg is 7.0 (previously around 11.0), MCV-66.4 (labs from clinic). Hemoccult is positive, but no active bleeding. Patient is on savaysa at home  -we will TF 1 unit PRBC due to symptomatic anemia (with lasix due to Aortic stenosis). Cont monitor Hg, TF prn. Hold anticoagulation, consulted GI eval for endoscopy, will keep NPO after midnight pend GI eval.  Start PPI  Aortic stenosis. Preserved  LV function. Echo (03/2014). LVEF 55-60%. AVA-0.72 cm 2. mean gradient 21.  -lasix post blood Tf to prevent fluid overload. We will obtain repeat echo, ? benefit from tavr pend echo results   Pedal edema. Likely combination of Aortic stenosis+CKD. Pulmonary exam is unremarkable. Check renal function. Will start diuresis as needed  CKD III. Will check renal labs today  Elevated blood pressure on admission. No h/o HTN. Cont monitor. Will consider oral medication if  persistent htn. Prn hydralazine   GI. D/w Dr. Michail Sermon.  if consultant consulted, please document name and whether formally or informally consulted  Code Status: full (must indicate code status--if unknown or must be presumed, indicate so) Family Communication: d/w patient (indicate person spoken with, if applicable, with phone number if by telephone) Disposition Plan: pend further eval (indicate anticipated LOS)  Time spent: >45 minutes   Kinnie Feil [facility] Pager [phone]  If 7PM-7AM, please contact night-coverage [email or web address] Password [email or web address] 07/28/2015, 12:10 PM

## 2015-07-28 NOTE — Consult Note (Signed)
Referring Provider: Dr. Daleen Bo Primary Care Physician:  Geoffery Lyons, MD Primary Gastroenterologist:  Althia Forts  Reason for Consultation:  Anemia  HPI: Justin Chambers is a 82 y.o. male, who is a retired [employer] operational officer and has been short of breath, weakness, and mild dizziness and was found to have a Hgb 7.0 with heme positive stool. He denies any melena or hematochezia. Denies abdominal pain, nausea, or vomiting. History of heartburn that is well-controlled with daily Omeprazole. He is on Edoxaban [facility]) for Afib. He is s/p a partial gastrectomy for peptic ulcer disease over 20 years ago. Recalls having a colonoscopy about 20 years ago but reports not known. Denies chest pain. Took several doses of Advil last week. Feels good and is hungry.   Past Medical History  Diagnosis Date  . Atrial fibrillation   . Dementia   . Chronic kidney disease     Past Surgical History  Procedure Laterality Date  . Abdominal surgery      part of stomach removed  . Partial gastrectomy Bilateral   . Cholecystectomy N/A 04/27/2013    Procedure: LAPAROSCOPIC CHOLECYSTECTOMY WITH INTRAOPERATIVE CHOLANGIOGRAM;  Surgeon: Imogene Burn. [name], MD;  Location: [place];  Service: General;  Laterality: N/A;  . Laparoscopic lysis of adhesions N/A 04/27/2013    Procedure: LAPAROSCOPIC LYSIS OF ADHESIONS;  Surgeon: Imogene Burn. [name], MD;  Location: [place];  Service: General;  Laterality: N/A;    Prior to Admission medications   Medication Sig Start Date End Date Taking? Authorizing Provider  Calcium-Phosphorus-Vitamin D (CITRACAL +D3 PO) Take 1 tablet by mouth.   Yes Historical Provider, MD  Cholecalciferol (VITAMIN D-3) 1000 UNITS CAPS Take by mouth.   Yes Historical Provider, MD  levothyroxine (SYNTHROID, LEVOTHROID) 50 MCG tablet Take 1 tablet by mouth daily. 01/28/14  Yes Historical Provider, MD  SAVAYSA 30 MG TABS tablet Take 1 tablet by mouth daily. 02/11/14  Yes Historical Provider, MD    Scheduled  Meds: . sodium chloride   Intravenous Once  . furosemide  20 mg Intravenous Once  . [START ON 07/29/2015] levothyroxine  50 mcg Oral QAC breakfast  . pantoprazole (PROTONIX) IV  40 mg Intravenous Q12H  . sodium chloride flush  3 mL Intravenous Q12H   Continuous Infusions:  PRN Meds:.hydrALAZINE  Allergies as of 07/28/2015 - Review Complete 07/28/2015  Allergen Reaction Noted  . Ceftriaxone Other (See Comments) 07/28/2015  . Cephalosporins  04/24/2013  . Latex  04/24/2013    No family history on file.  Social History   Social History  . Marital Status: Legally Separated    Spouse Name: N/A  . Number of Children: N/A  . Years of Education: N/A   Occupational History  . Not on file.   Social History Main Topics  . Smoking status: Never Smoker   . Smokeless tobacco: Not on file  . Alcohol Use: No  . Drug Use: No  . Sexual Activity: No   Other Topics Concern  . Not on file   Social History Narrative    Review of Systems: All negative except as stated above in HPI.  Physical Exam: Vital signs: Filed Vitals:   07/28/15 1112  BP: 161/75  Pulse: 64  Resp: 18   Last BM Date: 07/27/15 General:   Alert,  Well-developed, well-nourished, pleasant and cooperative in NAD, elderly Head: atraumatic Eyes: anicteric sclera ENT: oropharynx clear Neck: supple, nontender Lungs:  Clear throughout to auscultation.   No wheezes, crackles, or rhonchi. No acute distress. Heart:  Regular rate and rhythm; no murmurs, clicks, rubs,  or gallops. Abdomen: soft, nontender, nondistended, +BS  Rectal:  Deferred Ext: no edema  GI:  Lab Results: No results for input(s): WBC, HGB, HCT, PLT in the last 72 hours. BMET No results for input(s): NA, K, CL, CO2, GLUCOSE, BUN, CREATININE, CALCIUM in the last 72 hours. LFT No results for input(s): PROT, ALBUMIN, AST, ALT, ALKPHOS, BILITOT, BILIDIR, IBILI in the last 72 hours. PT/INR No results for input(s): LABPROT, INR in the last 72  hours.   Studies/Results: No results found.  Impression/Plan: 82 yo with symptomatic anemia with heme positive stool without any overt bleeding. Labs pending at the time of this consultation. No evidence of an active GI bleed. DDx peptic ulcer disease vs esophagitis and gastritis vs AVMs. Doubt malignancy but possible. Would manage conservatively and likely hold off on an EGD unless anemia worsens. Dr. Cristina Gong will reassess tomorrow and decide whether an EGD is needed. Continue Protonix 40 mg IV Q 12 hours. Clear liquid diet only today and NPO p MN in case EGD is needed tomorrow.    LOS: 0 days   Justin C.  07/28/2015, 1:48 PM  Pager [phone]  If no answer or after 5 PM call [phone]

## 2015-07-28 NOTE — Progress Notes (Signed)
  Echocardiogram 2D Echocardiogram has been performed.  Justin Chambers 07/28/2015, 3:51 PM

## 2015-07-28 NOTE — Progress Notes (Signed)
Patient arrived the unit from the doctors office, assessment completed see flowsheet, patient oriented to room and staff, placed on tele, ccmd notified, bed in lowest position, call light within reach, will continue tom monitor.

## 2015-07-29 DIAGNOSIS — I350 Nonrheumatic aortic (valve) stenosis: Secondary | ICD-10-CM

## 2015-07-29 DIAGNOSIS — R0609 Other forms of dyspnea: Secondary | ICD-10-CM

## 2015-07-29 DIAGNOSIS — D500 Iron deficiency anemia secondary to blood loss (chronic): Principal | ICD-10-CM

## 2015-07-29 DIAGNOSIS — E039 Hypothyroidism, unspecified: Secondary | ICD-10-CM | POA: Diagnosis present

## 2015-07-29 LAB — BASIC METABOLIC PANEL
ANION GAP: 7 (ref 5–15)
BUN: 19 mg/dL (ref 6–20)
CHLORIDE: 107 mmol/L (ref 101–111)
CO2: 23 mmol/L (ref 22–32)
Calcium: 8.3 mg/dL — ABNORMAL LOW (ref 8.9–10.3)
Creatinine, Ser: 1.92 mg/dL — ABNORMAL HIGH (ref 0.61–1.24)
GFR calc Af Amer: 34 mL/min — ABNORMAL LOW (ref >60)
GFR, EST NON AFRICAN AMERICAN: 29 mL/min — AB (ref >60)
GLUCOSE: 86 mg/dL (ref 65–99)
POTASSIUM: 4.7 mmol/L (ref 3.5–5.1)
SODIUM: 137 mmol/L (ref 135–145)

## 2015-07-29 LAB — CBC
HCT: 24.1 % — ABNORMAL LOW (ref 39.0–52.0)
HEMOGLOBIN: 7.3 g/dL — AB (ref 13.0–17.0)
MCH: 20.1 pg — ABNORMAL LOW (ref 26.0–34.0)
MCHC: 30.3 g/dL (ref 30.0–36.0)
MCV: 66.2 fL — AB (ref 78.0–100.0)
PLATELETS: 241 10*3/uL (ref 150–400)
RBC: 3.64 MIL/uL — AB (ref 4.22–5.81)
RDW: 19.1 % — ABNORMAL HIGH (ref 11.5–15.5)
WBC: 6.2 10*3/uL (ref 4.0–10.5)

## 2015-07-29 LAB — PREPARE RBC (CROSSMATCH)

## 2015-07-29 LAB — HEMOGLOBIN AND HEMATOCRIT, BLOOD
HCT: 29.9 % — ABNORMAL LOW (ref 39.0–52.0)
Hemoglobin: 9.3 g/dL — ABNORMAL LOW (ref 13.0–17.0)

## 2015-07-29 MED ORDER — SODIUM CHLORIDE 0.9 % IV SOLN: Freq: Once | INTRAVENOUS | Status: DC

## 2015-07-29 MED ORDER — SODIUM CHLORIDE 0.9 % IV SOLN: INTRAVENOUS | Status: DC

## 2015-07-29 NOTE — Consult Note (Signed)
Referring Provider:   Dr. Tyler Pita Primary Care Physician:  Geoffery Lyons, MD Primary Gastroenterologist:  Dr. Cristina Gong   Reason for Consultation:  Heme positive stool and anemia  HPI: Justin Chambers is a 82 y.o. male admitted directly last night because of a several week history of dyspnea on minimal exertion, and found to be severely anemic at his primary physician's office, with heme positive stool and a hemoglobin of 7.0. Here he had hemoglobin 6.9, MCV 65.   He has received one unit of packed cells, bringing his hemoglobin up to 7.3 this morning.   BUN has been normal.   It is unclear whether there has been recent use of aspirin or nonsteroidal anti-inflammatory drugs; I was told by the referring physician that he had used a couple of Advil tablets in the past week, but the patient now states it has been years since he used nonsteroidals.  He states that he was on Savaysa for blood thinning because of chronic atrial fibrillation until about a week or 10 days ago, when he was taken off that by his primary physician. We do not have independent confirmation of that, and it is possible that this patient, who has mild dementia, is getting his facts confused.  The patient is 40 years status post an antrectomy with Billroth II anastomosis for ulcer disease, in 1977. Subsequent to that, he had aspirin associated gastric ulcers in 1990 and in 1997, each time with endoscopic confirmation of healing. His most recent endoscopy, in June 2003, was normal except for the expected postoperative changes. His most recent colonoscopy was in 2003 also, and was normal at that time, at roughly age 62. The patient had a set of negative Hemoccults are our office in 2014.   Past Medical History  Diagnosis Date  . Atrial fibrillation ([place])   . Dementia   . Chronic kidney disease   . Anemia due to chronic blood loss 07/2015  . Cholecystitis   . CKD (chronic kidney disease)   . Alzheimer disease   . DOE (dyspnea  on exertion)     Past Surgical History  Procedure Laterality Date  . Abdominal surgery      part of stomach removed  . Partial gastrectomy Bilateral   . Cholecystectomy N/A 04/27/2013    Procedure: LAPAROSCOPIC CHOLECYSTECTOMY WITH INTRAOPERATIVE CHOLANGIOGRAM;  Surgeon: Imogene Burn. [name], MD;  Location: [place];  Service: General;  Laterality: N/A;  . Laparoscopic lysis of adhesions N/A 04/27/2013    Procedure: LAPAROSCOPIC LYSIS OF ADHESIONS;  Surgeon: Imogene Burn. [name], MD;  Location: [place];  Service: General;  Laterality: N/A;    Prior to Admission medications   Medication Sig Start Date End Date Taking? Authorizing Provider  Calcium-Phosphorus-Vitamin D (CITRACAL +D3 PO) Take 1 tablet by mouth.   Yes Historical Provider, MD  Cholecalciferol (VITAMIN D-3) 1000 UNITS CAPS Take by mouth.   Yes Historical Provider, MD  levothyroxine (SYNTHROID, LEVOTHROID) 50 MCG tablet Take 1 tablet by mouth daily. 01/28/14  Yes Historical Provider, MD  SAVAYSA 30 MG TABS tablet Take 1 tablet by mouth daily. 02/11/14  Yes Historical Provider, MD    Current Facility-Administered Medications  Medication Dose Route Frequency Provider Last Rate Last Dose  . 0.9 %  sodium chloride infusion   Intravenous Once Ripudeep K Rai, MD      . hydrALAZINE (APRESOLINE) injection 10 mg  10 mg Intravenous Q6H PRN Kinnie Feil, MD      . levothyroxine (SYNTHROID, LEVOTHROID) tablet 50  mcg  50 mcg Oral QAC breakfast Kinnie Feil, MD   50 mcg at 07/29/15 [phone]  . pantoprazole (PROTONIX) injection 40 mg  40 mg Intravenous Q12H Kinnie Feil, MD   40 mg at 07/28/15 2339  . sodium chloride flush (NS) 0.9 % injection 3 mL  3 mL Intravenous Q12H Kinnie Feil, MD   3 mL at 07/28/15 2341    Allergies as of 07/28/2015 - Review Complete 07/28/2015  Allergen Reaction Noted  . Ceftriaxone Other (See Comments) 07/28/2015  . Cephalosporins  04/24/2013  . Latex  04/24/2013    History reviewed. No pertinent family  history.  Social History   Social History  . Marital Status: Legally Separated    Spouse Name: N/A  . Number of Children: N/A  . Years of Education: N/A   Occupational History  . Not on file.   Social History Main Topics  . Smoking status: Never Smoker   . Smokeless tobacco: Never Used  . Alcohol Use: No  . Drug Use: No  . Sexual Activity: No   Other Topics Concern  . Not on file   Social History Narrative    Review of Systems: Significant dyspnea with minimal exertion in recent weeks, but no chest pain, no syncope although he felt like he "had to sit down" when he would exert himself recently. No dysphagia, no prodromal dyspeptic symptoms, no frank anorexia (although he doesn't care for the food at the [facility] center where he [facility]), perhaps mild weight loss of 5-10 pounds, no early satiety. No urinary complaints, no skin rashes, no enlarged lymph nodes. Does report mild change in bowel habits which he attributes to discomfort from his hernia.  Physical Exam: Vital signs in last 24 hours: Temp:  [97.5 F (36.4 C)-98.5 F (36.9 C)] 97.8 F (36.6 C) (05/27 1315) Pulse Rate:  [57-72] 62 (05/27 1315) Resp:  [18] 18 (05/27 1315) BP: (119-166)/(53-66) 165/63 mmHg (05/27 1315) SpO2:  [100 %] 100 % (05/27 1315) Weight:  [66.225 kg (146 lb)] 66.225 kg (146 lb) (05/26 1422) Last BM Date: 07/29/15 General:   Alert,  Well-developed, well-nourished, pleasant and cooperative in NAD. Very pale. Head:  Normocephalic and atraumatic. Eyes:  Sclera clear, no icterus.    Mouth:   No ulcerations or lesions.  Oropharynx pink & moist. Neck:   No masses or thyromegaly. Lungs:  Clear throughout to auscultation.   No wheezes, crackles, or rhonchi. No evident respiratory distress. Heart:   Irregular rhythm consistent with atrial fibrillation with well-controlled rate. Prominent 4/6 systolic murmur. Abdomen: Bowel sounds present. Truss for inguinal hernia in place. No guarding,  mass effect, or tenderness Neurologic:  Alert and quite coherent;  grossly normal neurologically. Heart is hearing. Slightly forgetful of various facts. Skin:  Intact without significant lesions or rashes. Cervical Nodes:  No significant cervical adenopathy. Psych:   Alert and cooperative. Normal mood and affect.  Intake/Output from previous day: 05/26 0701 - 05/27 0700 In: 845 [P.O.:480; Blood:365] Out: 500 [Urine:500] Intake/Output this shift: Total I/O In: 279 [Blood:279] Out: -   Lab Results:  Recent Labs  07/28/15 1304 07/28/15 2119 07/29/15 0418  WBC 5.7  --  6.2  HGB 6.9* 7.7* 7.3*  HCT 23.1* 25.6* 24.1*  PLT 265  --  241   BMET  Recent Labs  07/28/15 1304 07/29/15 0418  NA 137 137  K 4.4 4.7  CL 107 107  CO2 23 23  GLUCOSE 98 86  BUN 20 19  CREATININE 1.86* 1.92*  CALCIUM 8.7* 8.3*   LFT  Recent Labs  07/28/15 1304  PROT 5.3*  ALBUMIN 3.1*  AST 16  ALT 13*  ALKPHOS 65  BILITOT 0.6   PT/INR  Recent Labs  07/28/15 1304  LABPROT 15.2  INR 1.18    Studies/Results: No results found.  Impression: 1. Heme positive stool, severe microcytic anemia with progressive dyspnea. 2. Remote history of gastric antrectomy with Billroth II anastomosis 3. History of ulcer disease since his ulcer surgery, associated in the past with aspirin exposure 4. Atrial fibrillation, chronic anticoagulation 5. Inguinal hernia   Plan: Endoscopic evaluation tomorrow morning.   Petra Kuba, purpose, and risks reviewed with patient's daughter, [name], on the telephone. She is his power of attorney, but feels he is competent to sign his own papers, and I agree. He is familiar with endoscopy from multiple prior examinations but I reviewed the risks with him.  I do not see a need for urgent endoscopy since he is not actively bleeding and instead shows signs of chronic anemia, and is currently off his blood thinners. I feel it would be more prudent to defer the exam until his  current transfusion is complete, so we will plan for endoscopy in the morning.   In the meantime, I will allow a diet, and agree with empiric PPI therapy. Further management will depend on the endoscopic findings.      LOS: 1 day   Ashmore V  07/29/2015, 1:47 PM   Pager [phone] If no answer or after 5 PM call [phone]

## 2015-07-29 NOTE — Progress Notes (Signed)
[facility]                                                                              Patient Demographics  Justin Chambers, is a 82 y.o. 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 Brigitte Pulse) office with dyspnea on exertion and found to have Hg at 7.0 with hemoccult positive but no active bleeding. Referred to hospital admission for blood Transfusion  and evaluation. Pt reported dyspnea on exertion for several days, denied chest pains, no acute SOB, no cough. He had very mild dizziness. He denied recent bleedings, no abdominal pains, no hematochezia, no hematemesis. He reported h/o right inguinal hernia related intermittent discomforts. Reports taking several doses of Advil last week with no abdominal pains. He denied focal neuro symptoms.    Assessment & Plan    Symptomatic Anemia. Suspected chronic blood loss anemia.  - Hg is 7.0 (previously around 11.0), MCV-66.4 (labs from clinic). Hemoccult is positive, but no active bleeding. Patient is on savaysa at home  - Transfuse 1 unit packed RBCs, however hemoglobin still 7.3 this morning, down from 7.7 yesterday - Transfuse 2 more units of packed RBCs. Repeat H&H after transfusion - GI consulted, NPO for possible endoscopy - Continue PPI  Aortic stenosis. Preserved LV function. Echo (03/2014). LVEF 55-60%. AVA-0.72 cm 2. mean gradient 21.  -2-D echo with EF of 55-60%, normal wall motion, moderate aortic stenosis, mean gradient 32 mm hg  Pedal edema. Likely combination of Aortic  stenosis+CKD.  - Currently stable  Mild acute on Chronic kidney disease stage III - Creatinine trending up today, transfuse 2 more units packed RBCs, will recheck BMET in a.m.  Mild dementia   currently stable  Code Status:Full CODE STATUS  DVT Prophylaxis: SCD's   Family Communication: Discussed in detail with the patient, all imaging results, lab results explained to the patient   Disposition Plan:  Time Spent in minutes   25 minutes  Procedures:  None   Consultants:   GI  Antimicrobials:   None    Medications  Scheduled Meds: . sodium chloride   Intravenous Once  . levothyroxine  50 mcg Oral QAC breakfast  . pantoprazole (PROTONIX) IV  40 mg Intravenous Q12H  . sodium chloride flush  3 mL Intravenous Q12H   Continuous Infusions: . sodium chloride     PRN Meds:.hydrALAZINE   Antibiotics   Anti-infectives    None        Subjective:   Naftali Bielenberg was seen and examined today.  Patient denies dizziness, chest pain, shortness of breath, abdominal pain, N/V/D/C, new weakness, numbess, tingling. No acute events overnight.    Objective:  Filed Vitals:   07/28/15 2227 07/29/15 0427 07/29/15 0955 07/29/15 1025  BP: 166/53 126/61 126/65 141/66  Pulse: 57 62 63 61  Temp: 98.3 F (36.8 C) 98.5 F (36.9 C) 98.2 F (36.8 C) 97.7 F (36.5 C)  TempSrc: Oral Oral Oral Oral  Resp: 18 18 18 18   Height:      Weight:      SpO2: 100% 100% 100% 100%    Intake/Output Summary (Last 24 hours) at 07/29/15 1038 Last data filed at 07/28/15 1945  Gross per 24 hour  Intake    845 ml  Output    500 ml  Net    345 ml     Wt Readings from Last 3 Encounters:  07/28/15 66.225 kg (146 lb)  04/22/14 67.042 kg (147 lb 12.8 oz)  02/17/14 66.951 kg (147 lb 9.6 oz)     Exam  General: Alert and oriented x 3, NAD  HEENT:  PERRLA, EOMI, Anicteric Sclera, mucous membranes moist.   Neck: Supple, no JVD, no masses  Cardiovascular: S1 S2clear, 3/6  SEM  Respiratory: Clear to auscultation bilaterally, no wheezing, rales or rhonchi  Gastrointestinal: Soft, nontender, nondistended, + bowel sounds  Ext: no cyanosis clubbing or edema  Neuro: AAOx3, Cr N's II- XII. Strength 5/5 upper and lower extremities bilaterally  Skin: No rashes  Psych: Normal affect and demeanor, alert and oriented x3    Data Reviewed:  I have personally reviewed following labs and imaging studies  Micro Results No results found for this or any previous visit (from the past 240 hour(s)).  Radiology Reports No results found.  Lab Data:  CBC:  Recent Labs Lab 07/28/15 1304 07/28/15 2119 07/29/15 0418  WBC 5.7  --  6.2  HGB 6.9* 7.7* 7.3*  HCT 23.1* 25.6* 24.1*  MCV 65.4*  --  66.2*  PLT 265  --  [id number]   Basic Metabolic Panel:  Recent Labs Lab 07/28/15 1304 07/29/15 0418  NA 137 137  K 4.4 4.7  CL 107 107  CO2 23 23  GLUCOSE 98 86  BUN 20 19  CREATININE 1.86* 1.92*  CALCIUM 8.7* 8.3*   GFR: Estimated Creatinine Clearance: 23.9 mL/min (by C-G formula based on Cr of 1.92). Liver Function Tests:  Recent Labs Lab 07/28/15 1304  AST 16  ALT 13*  ALKPHOS 65  BILITOT 0.6  PROT 5.3*  ALBUMIN 3.1*   No results for input(s): LIPASE, AMYLASE in the last 168 hours. No results for input(s): AMMONIA in the last 168 hours. Coagulation Profile:  Recent Labs Lab 07/28/15 1304  INR 1.18   Cardiac Enzymes: No results for input(s): CKTOTAL, CKMB, CKMBINDEX, TROPONINI in the last 168 hours. BNP (last 3 results) No results for input(s): PROBNP in the last 8760 hours. HbA1C: No results for input(s): HGBA1C in the last 72 hours. CBG: No results for input(s): GLUCAP in the last 168 hours. Lipid Profile: No results for input(s): CHOL, HDL, LDLCALC, TRIG, CHOLHDL, LDLDIRECT in the last 72 hours. Thyroid Function Tests: No results for input(s): TSH, T4TOTAL, FREET4, T3FREE, THYROIDAB in the last 72 hours. Anemia Panel: No results for  input(s): VITAMINB12, FOLATE, FERRITIN, TIBC, IRON, RETICCTPCT in the last 72 hours. Urine analysis:    Component Value Date/Time   COLORURINE YELLOW 04/24/2013 1420   APPEARANCEUR CLOUDY* 04/24/2013 1420   LABSPEC 1.029 04/24/2013 1420   PHURINE 5.0 04/24/2013 1420   GLUCOSEU NEGATIVE 04/24/2013 1420   HGBUR NEGATIVE 04/24/2013 1420   BILIRUBINUR SMALL* 04/24/2013 1420  KETONESUR 15* 04/24/2013 1420   PROTEINUR 30* 04/24/2013 1420   UROBILINOGEN 1.0 04/24/2013 1420   NITRITE NEGATIVE 04/24/2013 1420   LEUKOCYTESUR NEGATIVE 04/24/2013 1420     RAI,RIPUDEEP M.D. [facility] 07/29/2015, 10:38 AM  Pager: [phone] Between 7am to 7pm - call Pager - [phone]  After 7pm go to [email or web address] - password [email or web address]  Call night coverage person covering after 7pm

## 2015-07-30 ENCOUNTER — Encounter (HOSPITAL_COMMUNITY)
Admission: AD | Disposition: A | Discharge status: Home / Self Care | Payer: Self-pay | Source: Ambulatory Visit | Attending: Internal Medicine

## 2015-07-30 ENCOUNTER — Encounter (HOSPITAL_COMMUNITY): Payer: Self-pay | Admitting: *Deleted

## 2015-07-30 DIAGNOSIS — G309 Alzheimer's disease, unspecified: Secondary | ICD-10-CM

## 2015-07-30 DIAGNOSIS — I350 Nonrheumatic aortic (valve) stenosis: Secondary | ICD-10-CM

## 2015-07-30 DIAGNOSIS — N183 Chronic kidney disease, stage 3 (moderate): Secondary | ICD-10-CM

## 2015-07-30 HISTORY — PX: ESOPHAGOGASTRODUODENOSCOPY: SHX5428

## 2015-07-30 LAB — TYPE AND SCREEN
ABO/RH(D): A NEG
Antibody Screen: NEGATIVE
UNIT DIVISION: 0
Unit division: 0
Unit division: 0

## 2015-07-30 LAB — BASIC METABOLIC PANEL
Anion gap: 8 (ref 5–15)
BUN: 21 mg/dL — AB (ref 6–20)
CALCIUM: 8.2 mg/dL — AB (ref 8.9–10.3)
CO2: 23 mmol/L (ref 22–32)
CREATININE: 1.96 mg/dL — AB (ref 0.61–1.24)
Chloride: 106 mmol/L (ref 101–111)
GFR calc non Af Amer: 28 mL/min — ABNORMAL LOW (ref >60)
GFR, EST AFRICAN AMERICAN: 33 mL/min — AB (ref >60)
Glucose, Bld: 92 mg/dL (ref 65–99)
Potassium: 4.2 mmol/L (ref 3.5–5.1)
SODIUM: 137 mmol/L (ref 135–145)

## 2015-07-30 LAB — HEMOGLOBIN AND HEMATOCRIT, BLOOD
HEMATOCRIT: 29.6 % — AB (ref 39.0–52.0)
HEMOGLOBIN: 9.3 g/dL — AB (ref 13.0–17.0)

## 2015-07-30 SURGERY — EGD (ESOPHAGOGASTRODUODENOSCOPY)
Anesthesia: Moderate Sedation

## 2015-07-30 MED ORDER — SUCRALFATE 1 G PO TABS
1.0000 g | ORAL_TABLET | Freq: Four times a day (QID) | ORAL | Status: DC
Start: 2015-07-30 — End: 2015-07-31
  Administered 2015-07-30 – 2015-07-31 (×4): 1 g via ORAL
  Filled 2015-07-30 (×6): qty 1

## 2015-07-30 MED ORDER — PANTOPRAZOLE SODIUM 40 MG PO TBEC: 40.0000 mg | DELAYED_RELEASE_TABLET | Freq: Every day | ORAL | Status: DC

## 2015-07-30 MED ORDER — PANTOPRAZOLE SODIUM 40 MG PO TBEC
40.0000 mg | DELAYED_RELEASE_TABLET | Freq: Every day | ORAL | Status: DC
  Administered 2015-07-30 – 2015-07-31 (×2): 40 mg via ORAL
  Filled 2015-07-30 (×3): qty 1

## 2015-07-30 MED ORDER — MIDAZOLAM HCL 5 MG/ML IJ SOLN
INTRAMUSCULAR | Status: AC
  Filled 2015-07-30: qty 1

## 2015-07-30 MED ORDER — SODIUM CHLORIDE 0.9 % IV SOLN
INTRAVENOUS | Status: DC
  Administered 2015-07-30: 500 mL via INTRAVENOUS

## 2015-07-30 MED ORDER — BUTAMBEN-TETRACAINE-BENZOCAINE 2-2-14 % EX AERO
INHALATION_SPRAY | CUTANEOUS | Status: DC | PRN
  Administered 2015-07-30: 2 via TOPICAL

## 2015-07-30 NOTE — Progress Notes (Signed)
Endoscopy was well tolerated. No specific bleeding site identified.  Please see the end of the procedure report for detailed recommendations regarding patient's management and disposition.  I will sign off. Please call if I can be of further assistance with this patient.  Cleotis Nipper, M.D. Pager [phone] If no answer or after 5 PM call [phone]

## 2015-07-30 NOTE — Op Note (Signed)
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 Estimated Blood Loss:     Estimated blood loss: none. Procedure:                Pre-Anesthesia Assessment:                           - Prior to the procedure, a History and Physical                            was performed, and patient medications and                            allergies were reviewed. The patient's tolerance of                            previous anesthesia was also reviewed. The risks                            and benefits of the procedure and the sedation                            options and risks were discussed with the patient.                            All questions were answered, and informed consent                            was obtained. Prior Anticoagulants: The patient has  taken anticoagulant medication, last dose was 10                            days prior to procedure. ASA Grade Assessment: III                            - A patient with severe systemic disease. After                            reviewing the risks and benefits, the patient was                            deemed in satisfactory condition to undergo the              procedure. This was done as an inpatient. It was                            intentionally done without sedation, in view of the                            patient's advanced age, and with his consent.                           After obtaining informed consent, the endoscope was                            passed under direct vision. Throughout the                            procedure, the patient's blood pressure, pulse, and                            oxygen saturations were monitored continuously. The                            [id number] scope was introduced through the                            mouth, and advanced to the afferent and efferent                            jejunal loops. The upper GI endoscopy was                            accomplished without difficulty. The patient                            tolerated the procedure well. Scope In: Scope Out: Findings:      The larynx was normal.      The examined esophagus was normal.      Evidence of a patent Billroth II gastrojejunostomy was found. The       gastrojejunal anastomosis was characterized by erythema and slightly       friable mucosa. This was traversed.  The efferent limb was examined about 4 cm from anastomosis and was       characterized by healthy appearing mucosa.      The afferent limb was examined about 3 cm from anastomosis and was       characterized by healthy appearing mucosa.      A medium amount of a phytobezoar (residual vegetable debris) was found       on the greater curvature of the stomach, obscuring a small part of the       proximal stomach. it is noteworthy that the patient had not eaten for       approximately 36 hours prior to this procedure.      Diffuse moderately erythematous mucosa without bleeding was found in the       entire examined stomach.      There is no endoscopic evidence of bleeding or fluid collections (blood       or coffee grounds) in the entire examined  stomach. Impression:               - No bleeding, blood, or chronic ground material                            present at time of this procedure                           - Normal larynx.                           - Normal esophagus.                           - Patent Billroth II gastrojejunostomy was found,                            characterized by erythema and friable mucosa.                           - A medium amount of a phytobezoar in the stomach.                           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                           - No specimens collected.                           THE SOURCE OF THIS PATIENT'S RECENT MELENA AND DROP                            IN HEMOGLOBIN IS NOT CLEARLY DEFINED ON THIS  EXAMINATION. The modest friability noted could                            conceivably have been hemorrhagic when he was                            recently on anticoagulation. Moderate Sedation:      No sedation administered. Recommendation:           - Low fiber diet indefinitely.                           - Use Protonix (pantoprazole) 40 mg PO daily for 2                            weeks empirically, in case there were small erosive                            or ulcerative changes not noted on the current exam.                           - Use sucralfate tablets 1 gram PO QID for 1 week                            because of possible bile reflux gastritis with                            associated friability.                           - Consider monitoring Hemoccults through his                            primary physician's office, since the patient                            presented with microcytic, acute-on-chronic anemia.                            The iron deficiency could well be explained by his                             antrectomy. However, it has been 14 years since he                            had his last colonoscopy, which was normal.                           - Monitor hemoglobin as an outpatient. Consider                            iron supplementation and further evaluation, such  as capsule endoscopy or colonoscopy, if the                            hemoglobin does not improve and/or if he remains                            Hemoccult positive. However, given the patient's                            advanced age and absence of GI symptoms, it is                            unlikely that such testing would be necessary or                            appropriate.                           - I would favor a diet that is low in vegetable                            residue, to minimize the chance of phytobezoar                            formation. Patients who have had a Billroth II                            antrectomy typically have difficulty expelling                            vegetable debris from the gastric remnant.                           - I would consider observing this elderly patient                            for another day in the hospital, to confirm                            stability of hemoglobin, since a defined source in                            the upper GI tract was not identified.                           - It appears that this patient's chronic                            anticoagulation was already stopped by his primary                            physician prior to this bleeding episode. However,  we do not have outside records to corroborate that.                            Although there was no endoscopic contraindication                            to resumption of anticoagulation identified on this                            exam, the fact that the patient has had bleeding                             with an unidentified source would make me somewhat                            reluctant to resume anticoagulation in this elderly                            patient.                           - Continue present medications, with GI meds and                            anticoagulation as noted above. Procedure Code(s):        --- Professional ---                           [phone], Esophagogastroduodenoscopy, flexible,                            transoral; diagnostic, including collection of                            specimen(s) by brushing or washing, when performed                            (separate procedure) Diagnosis Code(s):        --- Professional ---                           D62, Acute posthemorrhagic anemia                           Z98.0, Intestinal bypass and anastomosis status                           K31.89, Other diseases of stomach and duodenum                           K92.1, Melena (includes Hematochezia) CPT copyright 2016 [employer]. All rights reserved. The codes documented in this report are preliminary and upon coder review may  be revised to meet current compliance requirements. Ronald Lobo, MD 07/30/2015 9:44:00 AM This report has been signed electronically. Number of Addenda: 0

## 2015-07-30 NOTE — Progress Notes (Signed)
[facility]                                                                              Patient Demographics  Justin Chambers, is a 82 y.o. 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 Brigitte Pulse) office with dyspnea on exertion and found to have Hg at 7.0 with hemoccult positive but no active bleeding. Referred to hospital admission for blood Transfusion  and evaluation. Pt reported dyspnea on exertion for several days, denied chest pains, no acute SOB, no cough. He had very mild dizziness. He denied recent bleedings, no abdominal pains, no hematochezia, no hematemesis. He reported h/o right inguinal hernia related intermittent discomforts. Reports taking several doses of Advil last week with no abdominal pains. He denied focal neuro symptoms.    Assessment & Plan    Symptomatic Anemia. Suspected chronic blood loss anemia.  - Hg is 7.0 (previously around 11.0), MCV-66.4 (labs from clinic). Hemoccult is positive, but no active bleeding. Patient is on savaysa at home. Transfuse total of 3 units during this admission , H&H is stable this morning - GI consulted, underwent EGD today, patent Billroth II gastrojejunostomy with erythema and friable mucosa, medium amount of phytobezoar in the stomach, bile reflux gastritis, no acute bleeding.  GI, Dr Cristina Gong recommended low fiber diet indefinitely, Protonix 40 mg daily for 2 weeks, Carafate for 1 week, H&H and Hemoccult monitoring in PCP office  Aortic stenosis. Preserved LV function. Echo  (03/2014). LVEF 55-60%. AVA-0.72 cm 2. mean gradient 21.  -2-D echo with EF of 55-60%, normal wall motion, moderate aortic stenosis, mean gradient 32 mm hg  Pedal edema. Likely combination of Aortic stenosis+CKD.  - Currently stable  Mild acute on Chronic kidney disease stage III - Creatinine stable, no worsening  Mild dementia   currently stable  Code Status:Full CODE STATUS  DVT Prophylaxis: SCD's   Family Communication: Discussed in detail with the patient, all imaging results, lab results explained to the patient   Disposition Plan: Per GI recommendations, DC home in a.m.  Time Spent in minutes   25 minutes  Procedures:  None   Consultants:   GI  Antimicrobials:   None    Medications  Scheduled Meds: . [MAR Hold] sodium chloride   Intravenous Once  . [MAR Hold] levothyroxine  50 mcg Oral QAC breakfast  . pantoprazole  40 mg Oral Daily  . [MAR Hold] sodium chloride flush  3 mL Intravenous Q12H  . sucralfate  1 g Oral QID   Continuous Infusions: . sodium chloride 500 mL (07/30/15 0845)   PRN Meds:.[MAR Hold] hydrALAZINE   Antibiotics   Anti-infectives    None  Subjective:   Kaile Kyger was seen and examined today.  Patient seen prior to endoscopy. Patient denies dizziness, chest pain, shortness of breath, abdominal pain, N/V/D/C, new weakness, numbess, tingling. No obvious GI bleeding  Objective:   Filed Vitals:   07/30/15 0905 07/30/15 0915 07/30/15 0920 07/30/15 0935  BP: 161/72 192/98 178/86 144/98  Pulse: 65 75 64 50  Temp:      TempSrc:      Resp: 23 23 15 14   Height:      Weight:      SpO2: 98% 100% 99% 99%    Intake/Output Summary (Last 24 hours) at 07/30/15 1111 Last data filed at 07/30/15 0932  Gross per 24 hour  Intake    661 ml  Output    600 ml  Net     61 ml     Wt Readings from Last 3 Encounters:  07/28/15 66.225 kg (146 lb)  04/22/14 67.042 kg (147 lb 12.8 oz)  02/17/14 66.951 kg (147 lb 9.6 oz)      Exam  General: Alert and oriented x 3, NAD  HEENT:  .   Neck:   Cardiovascular: S1 S2clear, 3/6 SEM  Respiratory: Clear to auscultation bilaterally, no wheezing, rales or rhonchi  Gastrointestinal: Soft, NT, ND, NBS  Ext: no cyanosis clubbing or edema  Neuro: no new deficit  Skin: No rashes  Psych: Normal affect and demeanor, alert and oriented x3    Data Reviewed:  I have personally reviewed following labs and imaging studies  Micro Results No results found for this or any previous visit (from the past 240 hour(s)).  Radiology Reports No results found.  Lab Data:  CBC:  Recent Labs Lab 07/28/15 1304 07/28/15 2119 07/29/15 0418 07/29/15 1809 07/30/15 0200  WBC 5.7  --  6.2  --   --   HGB 6.9* 7.7* 7.3* 9.3* 9.3*  HCT 23.1* 25.6* 24.1* 29.9* 29.6*  MCV 65.4*  --  66.2*  --   --   PLT 265  --  241  --   --    Basic Metabolic Panel:  Recent Labs Lab 07/28/15 1304 07/29/15 0418 07/30/15 0200  NA 137 137 137  K 4.4 4.7 4.2  CL 107 107 106  CO2 23 23 23   GLUCOSE 98 86 92  BUN 20 19 21*  CREATININE 1.86* 1.92* 1.96*  CALCIUM 8.7* 8.3* 8.2*   GFR: Estimated Creatinine Clearance: 23.5 mL/min (by C-G formula based on Cr of 1.96). Liver Function Tests:  Recent Labs Lab 07/28/15 1304  AST 16  ALT 13*  ALKPHOS 65  BILITOT 0.6  PROT 5.3*  ALBUMIN 3.1*   No results for input(s): LIPASE, AMYLASE in the last 168 hours. No results for input(s): AMMONIA in the last 168 hours. Coagulation Profile:  Recent Labs Lab 07/28/15 1304  INR 1.18   Cardiac Enzymes: No results for input(s): CKTOTAL, CKMB, CKMBINDEX, TROPONINI in the last 168 hours. BNP (last 3 results) No results for input(s): PROBNP in the last 8760 hours. HbA1C: No results for input(s): HGBA1C in the last 72 hours. CBG: No results for input(s): GLUCAP in the last 168 hours. Lipid Profile: No results for input(s): CHOL, HDL, LDLCALC, TRIG, CHOLHDL, LDLDIRECT in the last 72  hours. Thyroid Function Tests: No results for input(s): TSH, T4TOTAL, FREET4, T3FREE, THYROIDAB in the last 72 hours. Anemia Panel: No results for input(s): VITAMINB12, FOLATE, FERRITIN, TIBC, IRON, RETICCTPCT in the last 72 hours. Urine analysis:    Component Value  Date/Time   COLORURINE YELLOW 04/24/2013 1420   APPEARANCEUR CLOUDY* 04/24/2013 1420   LABSPEC 1.029 04/24/2013 1420   PHURINE 5.0 04/24/2013 1420   GLUCOSEU NEGATIVE 04/24/2013 1420   HGBUR NEGATIVE 04/24/2013 1420   BILIRUBINUR SMALL* 04/24/2013 1420   KETONESUR 15* 04/24/2013 1420   PROTEINUR 30* 04/24/2013 1420   UROBILINOGEN 1.0 04/24/2013 1420   NITRITE NEGATIVE 04/24/2013 1420   LEUKOCYTESUR NEGATIVE 04/24/2013 [place] M.D. [facility] 07/30/2015, 11:11 AM  Pager: [phone] Between 7am to 7pm - call Pager - [phone]  After 7pm go to [email or web address] - password [email or web address]  Call night coverage person covering after 7pm

## 2015-07-30 NOTE — Progress Notes (Signed)
Patient to ENDO .N PO status maintained VSS .

## 2015-07-31 LAB — CBC
HCT: 31.1 % — ABNORMAL LOW (ref 39.0–52.0)
HEMOGLOBIN: 9.6 g/dL — AB (ref 13.0–17.0)
MCH: 21.9 pg — AB (ref 26.0–34.0)
MCHC: 30.9 g/dL (ref 30.0–36.0)
MCV: 69.8 fL — ABNORMAL LOW (ref 78.0–100.0)
PLATELETS: 258 10*3/uL (ref 150–400)
RBC: 4.38 MIL/uL (ref 4.22–5.81)
RDW: 21.3 % — ABNORMAL HIGH (ref 11.5–15.5)
WBC: 6.7 10*3/uL (ref 4.0–10.5)

## 2015-07-31 LAB — BASIC METABOLIC PANEL
ANION GAP: 6 (ref 5–15)
BUN: 18 mg/dL (ref 6–20)
CALCIUM: 8.4 mg/dL — AB (ref 8.9–10.3)
CO2: 25 mmol/L (ref 22–32)
CREATININE: 1.99 mg/dL — AB (ref 0.61–1.24)
Chloride: 107 mmol/L (ref 101–111)
GFR, EST AFRICAN AMERICAN: 32 mL/min — AB (ref >60)
GFR, EST NON AFRICAN AMERICAN: 28 mL/min — AB (ref >60)
Glucose, Bld: 92 mg/dL (ref 65–99)
Potassium: 3.8 mmol/L (ref 3.5–5.1)
Sodium: 138 mmol/L (ref 135–145)

## 2015-07-31 MED ORDER — PANTOPRAZOLE SODIUM 40 MG PO TBEC: 40.0000 mg | DELAYED_RELEASE_TABLET | Freq: Every day | ORAL | Status: AC

## 2015-07-31 MED ORDER — SUCRALFATE 1 G PO TABS: 1.0000 g | ORAL_TABLET | Freq: Four times a day (QID) | ORAL | Status: DC

## 2015-07-31 NOTE — Discharge Summary (Signed)
Physician Discharge Summary   Patient ID: Justin Chambers MRN: [id number] DOB/AGE: [birth year] 82 y.o.  Admit date: 07/28/2015 Discharge date: 07/31/2015  Primary Care Physician:  Geoffery Lyons, MD  Discharge Diagnoses:    . Anemia due to chronic blood loss/GI bleed  . DOE (dyspnea on exertion) . Blood loss anemia . Alzheimer disease . Chronic kidney disease (CKD) stage G3a/A3, moderately decreased glomerular filtration rate (GFR) between 45-59 mL/min/1.73 square meter and albuminuria creatinine ratio greater than 300 mg/g . Hypothyroidism  Consults:  {Gastroenterology  Recommendations for Outpatient Follow-up:  1. Please repeat CBC/BMET at next visit 2. Patient admitted to hold savaysa until follow-up with his primary care physician and cardiologist   DIET: Heart healthy diet    Allergies:   Allergies  Allergen Reactions  . Ceftriaxone Other (See Comments)    No reaction listed  . Cephalosporins     Unknown, previously documented in [place], pt does not recall reactions  . Latex      DISCHARGE MEDICATIONS: Current Discharge Medication List    START taking these medications   Details  pantoprazole (PROTONIX) 40 MG tablet Take 1 tablet (40 mg total) by mouth daily. Qty: 30 tablet, Refills: 3    sucralfate (CARAFATE) 1 g tablet Take 1 tablet (1 g total) by mouth 4 (four) times daily. X 1 week Qty: 30 tablet, Refills: 0      CONTINUE these medications which have NOT CHANGED   Details  Calcium-Phosphorus-Vitamin D (CITRACAL +D3 PO) Take 1 tablet by mouth.    Cholecalciferol (VITAMIN D-3) 1000 UNITS CAPS Take by mouth.    levothyroxine (SYNTHROID, LEVOTHROID) 50 MCG tablet Take 1 tablet by mouth daily. 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 Brigitte Pulse) office with dyspnea on exertion and found to have Hg at 7.0 with hemoccult positive but no active bleeding. Referred to hospital admission for blood Transfusion and evaluation. Pt reported dyspnea on exertion for several days, denied chest pains, no acute SOB, no cough. He had very mild dizziness. He denied recent bleedings, no abdominal pains, no hematochezia, no hematemesis. He reported h/o right inguinal hernia related intermittent discomforts. Reports taking several doses of Advil last week with no abdominal pains. He denied focal neuro symptoms.   Hospital Course:  Symptomatic Anemia. Suspected chronic blood loss anemia.  - Hg is 7.0 (previously around 11.0), MCV-66.4 (labs from clinic). Hemoccult was positive, but no active bleeding. Patient was on savaysa/anti-graduation at home. Transfused total of 3 units during this admission  - H&H has been stable - GI was consulted, underwent EGD, patent Billroth II gastrojejunostomy with erythema and friable mucosa, medium amount of phytobezoar in the stomach, bile reflux gastritis, no acute bleeding. GI, Dr Cristina Gong recommended low fiber diet indefinitely, Protonix 40 mg daily for 2 weeks, Carafate for 1 week, H&H and Hemoccult monitoring in PCP office  Aortic stenosis. Preserved LV function. Echo (03/2014). LVEF 55-60%. AVA-0.72 cm 2. mean gradient 21.  -2-D echo with EF of 55-60%, normal wall motion, moderate aortic stenosis, mean gradient 32 mm hg  Pedal edema. Likely combination  of Aortic stenosis+CKD.  - Currently stable  Mild acute on Chronic kidney disease stage III - Creatinine stable, no worsening  Mild dementia  currently stable   Day of Discharge BP 148/68 mmHg  Pulse 79  Temp(Src) 98.2 F (36.8 C) (Oral)  Resp 18  Ht 5\' 8"  (1.727 m)  Wt 66.225 kg (146 lb)  BMI 22.20 kg/m2  SpO2  96%  Physical Exam: General: Alert and awake oriented x3 not in any acute distress. HEENT: anicteric sclera, pupils reactive to light and accommodation CVS: S1-S2 clear no murmur rubs or gallops Chest: clear to auscultation bilaterally, no wheezing rales or rhonchi Abdomen: soft nontender, nondistended, normal bowel sounds Extremities: no cyanosis, clubbing or edema noted bilaterally Neuro: Cranial nerves II-XII intact, no focal neurological deficits   The results of significant diagnostics from this hospitalization (including imaging, microbiology, ancillary and laboratory) are listed below for reference.    LAB RESULTS: Basic Metabolic Panel:  Recent Labs Lab 07/30/15 0200 07/31/15 0153  NA 137 138  K 4.2 3.8  CL 106 107  CO2 23 25  GLUCOSE 92 92  BUN 21* 18  CREATININE 1.96* 1.99*  CALCIUM 8.2* 8.4*   Liver Function Tests:  Recent Labs Lab 07/28/15 1304  AST 16  ALT 13*  ALKPHOS 65  BILITOT 0.6  PROT 5.3*  ALBUMIN 3.1*   No results for input(s): LIPASE, AMYLASE in the last 168 hours. No results for input(s): AMMONIA in the last 168 hours. CBC:  Recent Labs Lab 07/29/15 0418  07/30/15 0200 07/31/15 0153  WBC 6.2  --   --  6.7  HGB 7.3*  < > 9.3* 9.6*  HCT 24.1*  < > 29.6* 31.1*  MCV 66.2*  --   --  69.8*  PLT 241  --   --  258  < > = values in this interval not displayed. Cardiac Enzymes: No results for input(s): CKTOTAL, CKMB, CKMBINDEX, TROPONINI in the last 168 hours. BNP: Invalid input(s): POCBNP CBG: No results for input(s): GLUCAP in the last 168 hours.  Significant Diagnostic Studies:  No results found.  2D ECHO:   Disposition and Follow-up: Discharge Instructions    Diet - low sodium heart healthy    Complete by:  As directed      Discharge instructions    Complete by:  As directed   Please [place] and discuss with your cardiologist or primary physician whether to discontinue it.     Increase activity slowly    Complete by:   As directed             DISPOSITION: Home   DISCHARGE FOLLOW-UP Follow-up Information    Follow up with Cleotis Nipper, MD. Schedule an appointment as soon as possible for a visit in 2 weeks.   Specialty:  Gastroenterology   Why:  for hospital follow-up   Contact information:   [address] [place] [address] [phone]       Follow up with ARONSON,RICHARD A, MD. Schedule an appointment as soon as possible for a visit in 10 days.   Specialty:  Internal Medicine   Why:  for hospital follow-up, obtain labs, CBC, please discuss about Savaysa blood thinner   Contact information:   [address] [place] [address] [phone]        Time spent on Discharge: 25 minutes  Signed:   Kaylyn Garrow M.D. [facility] 07/31/2015, 7:21 AM Pager: [phone]

## 2015-07-31 NOTE — Care Management Important Message (Signed)
Important Message  Patient Details  Name: Justin Chambers MRN: [id number] Date of Birth: [birth year]   Medicare Important Message Given:  Yes    Kirkland Figg P Idora Brosious 07/31/2015, 9:46 AM

## 2015-08-01 DIAGNOSIS — R260 Ataxic gait: Secondary | ICD-10-CM | POA: Diagnosis not present

## 2015-08-01 DIAGNOSIS — R2681 Unsteadiness on feet: Secondary | ICD-10-CM | POA: Diagnosis not present

## 2015-08-01 DIAGNOSIS — M6281 Muscle weakness (generalized): Secondary | ICD-10-CM | POA: Diagnosis not present

## 2015-08-03 DIAGNOSIS — M6281 Muscle weakness (generalized): Secondary | ICD-10-CM | POA: Diagnosis not present

## 2015-08-03 DIAGNOSIS — R2681 Unsteadiness on feet: Secondary | ICD-10-CM | POA: Diagnosis not present

## 2015-08-03 DIAGNOSIS — R260 Ataxic gait: Secondary | ICD-10-CM | POA: Diagnosis not present

## 2015-08-08 DIAGNOSIS — R2681 Unsteadiness on feet: Secondary | ICD-10-CM | POA: Diagnosis not present

## 2015-08-08 DIAGNOSIS — M6281 Muscle weakness (generalized): Secondary | ICD-10-CM | POA: Diagnosis not present

## 2015-08-08 DIAGNOSIS — R260 Ataxic gait: Secondary | ICD-10-CM | POA: Diagnosis not present

## 2015-08-10 DIAGNOSIS — R2681 Unsteadiness on feet: Secondary | ICD-10-CM | POA: Diagnosis not present

## 2015-08-10 DIAGNOSIS — R260 Ataxic gait: Secondary | ICD-10-CM | POA: Diagnosis not present

## 2015-08-10 DIAGNOSIS — M6281 Muscle weakness (generalized): Secondary | ICD-10-CM | POA: Diagnosis not present

## 2015-08-11 DIAGNOSIS — D500 Iron deficiency anemia secondary to blood loss (chronic): Secondary | ICD-10-CM | POA: Diagnosis not present

## 2015-08-14 DIAGNOSIS — R195 Other fecal abnormalities: Secondary | ICD-10-CM | POA: Diagnosis not present

## 2015-08-14 DIAGNOSIS — D500 Iron deficiency anemia secondary to blood loss (chronic): Secondary | ICD-10-CM | POA: Diagnosis not present

## 2015-08-15 DIAGNOSIS — M6281 Muscle weakness (generalized): Secondary | ICD-10-CM | POA: Diagnosis not present

## 2015-08-15 DIAGNOSIS — R2681 Unsteadiness on feet: Secondary | ICD-10-CM | POA: Diagnosis not present

## 2015-08-15 DIAGNOSIS — R260 Ataxic gait: Secondary | ICD-10-CM | POA: Diagnosis not present

## 2015-08-17 DIAGNOSIS — R2681 Unsteadiness on feet: Secondary | ICD-10-CM | POA: Diagnosis not present

## 2015-08-17 DIAGNOSIS — M6281 Muscle weakness (generalized): Secondary | ICD-10-CM | POA: Diagnosis not present

## 2015-08-17 DIAGNOSIS — R260 Ataxic gait: Secondary | ICD-10-CM | POA: Diagnosis not present

## 2015-08-18 DIAGNOSIS — I739 Peripheral vascular disease, unspecified: Secondary | ICD-10-CM | POA: Diagnosis not present

## 2015-08-18 DIAGNOSIS — L603 Nail dystrophy: Secondary | ICD-10-CM | POA: Diagnosis not present

## 2015-08-20 ENCOUNTER — Emergency Department (HOSPITAL_COMMUNITY)
Admission: EM | Admit: 2015-08-20 | Discharge: 2015-08-20 | Disposition: A | Discharge status: Home / Self Care | Payer: Medicare Other | Attending: Emergency Medicine | Admitting: Emergency Medicine

## 2015-08-20 ENCOUNTER — Encounter (HOSPITAL_COMMUNITY): Payer: Self-pay

## 2015-08-20 DIAGNOSIS — G309 Alzheimer's disease, unspecified: Secondary | ICD-10-CM | POA: Insufficient documentation

## 2015-08-20 DIAGNOSIS — R319 Hematuria, unspecified: Secondary | ICD-10-CM

## 2015-08-20 DIAGNOSIS — N189 Chronic kidney disease, unspecified: Secondary | ICD-10-CM | POA: Diagnosis not present

## 2015-08-20 DIAGNOSIS — Z91040 Latex allergy status: Secondary | ICD-10-CM | POA: Diagnosis not present

## 2015-08-20 LAB — CBC WITH DIFFERENTIAL/PLATELET
Basophils Absolute: 0 10*3/uL (ref 0.0–0.1)
Basophils Relative: 0 %
Eosinophils Absolute: 0.3 10*3/uL (ref 0.0–0.7)
Eosinophils Relative: 4 %
HCT: 33.4 % — ABNORMAL LOW (ref 39.0–52.0)
Hemoglobin: 10.2 g/dL — ABNORMAL LOW (ref 13.0–17.0)
Lymphocytes Relative: 14 %
Lymphs Abs: 0.9 10*3/uL (ref 0.7–4.0)
MCH: 22.1 pg — ABNORMAL LOW (ref 26.0–34.0)
MCHC: 30.5 g/dL (ref 30.0–36.0)
MCV: 72.3 fL — ABNORMAL LOW (ref 78.0–100.0)
Monocytes Absolute: 0.7 10*3/uL (ref 0.1–1.0)
Monocytes Relative: 11 %
Neutro Abs: 4.8 10*3/uL (ref 1.7–7.7)
Neutrophils Relative %: 71 %
Platelets: 298 10*3/uL (ref 150–400)
RBC: 4.62 MIL/uL (ref 4.22–5.81)
RDW: 23.8 % — ABNORMAL HIGH (ref 11.5–15.5)
WBC: 6.7 10*3/uL (ref 4.0–10.5)

## 2015-08-20 LAB — URINALYSIS, ROUTINE W REFLEX MICROSCOPIC
Glucose, UA: NEGATIVE mg/dL
KETONES UR: 15 mg/dL — AB
NITRITE: NEGATIVE
PROTEIN: 30 mg/dL — AB
Specific Gravity, Urine: 1.021 (ref 1.005–1.030)
pH: 5 (ref 5.0–8.0)

## 2015-08-20 LAB — BASIC METABOLIC PANEL
Anion gap: 6 (ref 5–15)
BUN: 19 mg/dL (ref 6–20)
CO2: 25 mmol/L (ref 22–32)
Calcium: 8.6 mg/dL — ABNORMAL LOW (ref 8.9–10.3)
Chloride: 106 mmol/L (ref 101–111)
Creatinine, Ser: 1.91 mg/dL — ABNORMAL HIGH (ref 0.61–1.24)
GFR calc Af Amer: 34 mL/min — ABNORMAL LOW (ref >60)
GFR calc non Af Amer: 29 mL/min — ABNORMAL LOW (ref >60)
Glucose, Bld: 97 mg/dL (ref 65–99)
Potassium: 4.1 mmol/L (ref 3.5–5.1)
Sodium: 137 mmol/L (ref 135–145)

## 2015-08-20 LAB — TYPE AND SCREEN
ABO/RH(D): A NEG
Antibody Screen: NEGATIVE

## 2015-08-20 LAB — URINE MICROSCOPIC-ADD ON

## 2015-08-20 LAB — PROTIME-INR
INR: 1 (ref 0.00–1.49)
PROTHROMBIN TIME: 13.4 s (ref 11.6–15.2)

## 2015-08-20 NOTE — ED Notes (Signed)
To room via EMS from [place] at [facility].  Onset today hematuria several times.  Pt reports several weeks of dysuria.  Pt has current left inguinal hernia, wearing binder. No abd/flank pain or fevers.

## 2015-08-20 NOTE — ED Provider Notes (Signed)
CSN: [id number]     Arrival date & time 08/20/15  1644 History   First MD Initiated Contact with Patient 08/20/15 1649     Chief Complaint  Patient presents with  . Hematuria    HPI Comments: 82 year old male presents with gross painless hematuria for the past day. He is from [employer] at [facility]. Past medical history significant for A. fib currently not on anticoagulation, chronic kidney disease, anemia requiring transfusions in the past, right inguinal hernia, dementia. Past surgical history significant for cholecystectomy and a partial gastrectomy. He states he was going to urinate earlier today and noticed bright red blood when he went to urinate. He had a second episode of hematuria however he is not totally sure at what part of his stream he noticed the blood. He reports associated lightheadedness. Denies fever, chest pain, shortness of breath, abdominal pain, flank pain, back pain, nausea, vomiting, constipation/diarrhea, dysuria, testicular pain, penis pain. He denies being a smoker. He was taken off anticoagulation due to low blood counts requiring transfusions.    Patient is a 82 y.o. male presenting with hematuria.  Hematuria Pertinent negatives include no abdominal pain, fatigue, fever, nausea, rash or vomiting.    Past Medical History  Diagnosis Date  . Atrial fibrillation ([place])   . Dementia   . Chronic kidney disease   . Anemia due to chronic blood loss 07/2015  . Cholecystitis   . CKD (chronic kidney disease)   . Alzheimer disease   . DOE (dyspnea on exertion)    Past Surgical History  Procedure Laterality Date  . Abdominal surgery      part of stomach removed  . Partial gastrectomy Bilateral   . Cholecystectomy N/A 04/27/2013    Procedure: LAPAROSCOPIC CHOLECYSTECTOMY WITH INTRAOPERATIVE CHOLANGIOGRAM;  Surgeon: Imogene Burn. [name], MD;  Location: [place];  Service: General;  Laterality: N/A;  . Laparoscopic lysis of adhesions N/A 04/27/2013    Procedure: LAPAROSCOPIC  LYSIS OF ADHESIONS;  Surgeon: Imogene Burn. [name], MD;  Location: [place];  Service: General;  Laterality: N/A;  . Esophagogastroduodenoscopy N/A 07/30/2015    Procedure: ESOPHAGOGASTRODUODENOSCOPY (EGD);  Surgeon: Ronald Lobo, MD;  Location: [facility] ENDOSCOPY;  Service: Endoscopy;  Laterality: N/A;  Pediatric UPPER endoscope, please   History reviewed. No pertinent family history. Social History  Substance Use Topics  . Smoking status: Never Smoker   . Smokeless tobacco: Never Used  . Alcohol Use: No    Review of Systems  Constitutional: Negative for fever and fatigue.  Respiratory: Negative for shortness of breath.   Gastrointestinal: Negative for nausea, vomiting, abdominal pain, diarrhea, constipation and blood in stool.  Genitourinary: Positive for hematuria. Negative for dysuria, flank pain, penile pain and testicular pain.  Skin: Negative for rash.  Neurological: Positive for light-headedness.  All other systems reviewed and are negative.   Allergies  Ceftriaxone; Cephalosporins; Latex; and Savaysa  Home Medications   Prior to Admission medications   Medication Sig Start Date End Date Taking? Authorizing Provider  Cholecalciferol (VITAMIN D-3) 1000 UNITS CAPS Take 3,000 Units by mouth daily.    Yes Historical Provider, MD  levothyroxine (SYNTHROID, LEVOTHROID) 50 MCG tablet Take 50 mcg by mouth daily before breakfast.  01/28/14  Yes Historical Provider, MD  Multiple Vitamins-Minerals (MULTIVITAMIN ADULTS 50+) TABS Take 1 tablet by mouth daily.   Yes Historical Provider, MD  pantoprazole (PROTONIX) 40 MG tablet Take 1 tablet (40 mg total) by mouth daily. 07/31/15  Yes Ripudeep Krystal Eaton, MD   BP 161/76 mmHg  Pulse 61  Temp(Src) 97.7 F (36.5 C) (Oral)  Resp 16  SpO2 98%   Physical Exam  Constitutional: He is oriented to person, place, and time. He appears well-developed and well-nourished. No distress.  Elderly male, NAD  HENT:  Head: Normocephalic and atraumatic.  Eyes:  Conjunctivae are normal. Pupils are equal, round, and reactive to light. Right eye exhibits no discharge. Left eye exhibits no discharge. No scleral icterus.  Neck: Normal range of motion.  Cardiovascular: Normal rate and regular rhythm.  Exam reveals no gallop and no friction rub.   Murmur heard. 4/6 holosystolic murmur, most prominent in the RICS  Pulmonary/Chest: Effort normal and breath sounds normal. No respiratory distress. He has no wheezes. He has no rales. He exhibits no tenderness.  Abdominal: Soft. Bowel sounds are normal. He exhibits no distension and no mass. There is no hepatosplenomegaly. There is no tenderness. There is no rebound, no guarding and no CVA tenderness.  Well healed mid-line scar  Genitourinary:  No inguinal lymphadenopathy. Right inguinal hernia noted which is painless and reducible. Normal non-circumcised penis free of lesions or rash. Testicles are nontender with normal lie. Normal scrotal appearance. No obvious discharge noted. Dried blood noted on boxers. Chaperone present during exam.    Neurological: He is alert and oriented to person, place, and time.  Skin: Skin is warm and dry.  Psychiatric: He has a normal mood and affect.    ED Course  Procedures (including critical care time) Labs Review Labs Reviewed  URINALYSIS, ROUTINE W REFLEX MICROSCOPIC (NOT AT [facility]) - Abnormal; Notable for the following:    Color, Urine AMBER (*)    APPearance CLOUDY (*)    Hgb urine dipstick LARGE (*)    Bilirubin Urine SMALL (*)    Ketones, ur 15 (*)    Protein, ur 30 (*)    Leukocytes, UA SMALL (*)    All other components within normal limits  CBC WITH DIFFERENTIAL/PLATELET - Abnormal; Notable for the following:    Hemoglobin 10.2 (*)    HCT 33.4 (*)    MCV 72.3 (*)    MCH 22.1 (*)    RDW 23.8 (*)    All other components within normal limits  BASIC METABOLIC PANEL - Abnormal; Notable for the following:    Creatinine, Ser 1.91 (*)    Calcium 8.6 (*)    GFR  calc non Af Amer 29 (*)    GFR calc Af Amer 34 (*)    All other components within normal limits  URINE MICROSCOPIC-ADD ON - Abnormal; Notable for the following:    Squamous Epithelial / LPF 0-5 (*)    Bacteria, UA RARE (*)    All other components within normal limits  PROTIME-INR  TYPE AND SCREEN    Imaging Review No results found. I have personally reviewed and evaluated these images and lab results as part of my medical decision-making.   EKG Interpretation None      MDM   Final diagnoses:  Hematuria   82 year old male presents with gross painless hematuria. Patient is afebrile, not tachycardic or tachypneic, and not hypoxic. He is hypertensive at times. CBC remarkable for anemia which appears to be baseline. H&H was 9.6/31.1 weeks ago and today it is 10.2/33.4. PT/INR normal. BMP remarkable for elevated SCr which appears to be at baseline as well. UA remarkable for large Hgb. Patient has not had any hematuria here in the ED. He also states he was very SOB last time he  required blood transfusions and today he denies any pain and feels well overall. Bladder scan measured at 98cc post-void. Shared visit with Dr. Johnney Killian. Urology follow up recommended. Patient is NAD, non-toxic, with stable VS. Patient is informed of clinical course, understands medical decision making process, and agrees with plan. Opportunity for questions provided and all questions answered. Return precautions given.   Recardo Evangelist, PA-C 08/21/15 1204  Charlesetta Shanks, MD 08/23/15 0001

## 2015-08-20 NOTE — Discharge Instructions (Signed)

## 2015-08-22 DIAGNOSIS — R2681 Unsteadiness on feet: Secondary | ICD-10-CM | POA: Diagnosis not present

## 2015-08-22 DIAGNOSIS — R260 Ataxic gait: Secondary | ICD-10-CM | POA: Diagnosis not present

## 2015-08-22 DIAGNOSIS — M6281 Muscle weakness (generalized): Secondary | ICD-10-CM | POA: Diagnosis not present

## 2015-08-24 DIAGNOSIS — R260 Ataxic gait: Secondary | ICD-10-CM | POA: Diagnosis not present

## 2015-08-24 DIAGNOSIS — R2681 Unsteadiness on feet: Secondary | ICD-10-CM | POA: Diagnosis not present

## 2015-08-24 DIAGNOSIS — M6281 Muscle weakness (generalized): Secondary | ICD-10-CM | POA: Diagnosis not present

## 2015-08-29 DIAGNOSIS — R260 Ataxic gait: Secondary | ICD-10-CM | POA: Diagnosis not present

## 2015-08-29 DIAGNOSIS — R2681 Unsteadiness on feet: Secondary | ICD-10-CM | POA: Diagnosis not present

## 2015-08-29 DIAGNOSIS — M6281 Muscle weakness (generalized): Secondary | ICD-10-CM | POA: Diagnosis not present

## 2015-08-30 DIAGNOSIS — D500 Iron deficiency anemia secondary to blood loss (chronic): Secondary | ICD-10-CM | POA: Diagnosis not present

## 2015-08-30 DIAGNOSIS — N183 Chronic kidney disease, stage 3 (moderate): Secondary | ICD-10-CM | POA: Diagnosis not present

## 2015-08-30 DIAGNOSIS — I482 Chronic atrial fibrillation: Secondary | ICD-10-CM | POA: Diagnosis not present

## 2015-08-30 DIAGNOSIS — Z6821 Body mass index (BMI) 21.0-21.9, adult: Secondary | ICD-10-CM | POA: Diagnosis not present

## 2015-08-30 DIAGNOSIS — R413 Other amnesia: Secondary | ICD-10-CM | POA: Diagnosis not present

## 2015-08-31 DIAGNOSIS — R260 Ataxic gait: Secondary | ICD-10-CM | POA: Diagnosis not present

## 2015-08-31 DIAGNOSIS — M6281 Muscle weakness (generalized): Secondary | ICD-10-CM | POA: Diagnosis not present

## 2015-08-31 DIAGNOSIS — R2681 Unsteadiness on feet: Secondary | ICD-10-CM | POA: Diagnosis not present

## 2015-09-01 DIAGNOSIS — Z6821 Body mass index (BMI) 21.0-21.9, adult: Secondary | ICD-10-CM | POA: Diagnosis not present

## 2015-09-01 DIAGNOSIS — D500 Iron deficiency anemia secondary to blood loss (chronic): Secondary | ICD-10-CM | POA: Diagnosis not present

## 2015-09-01 DIAGNOSIS — I482 Chronic atrial fibrillation: Secondary | ICD-10-CM | POA: Diagnosis not present

## 2015-09-07 DIAGNOSIS — N401 Enlarged prostate with lower urinary tract symptoms: Secondary | ICD-10-CM | POA: Diagnosis not present

## 2015-09-07 DIAGNOSIS — F0150 Vascular dementia without behavioral disturbance: Secondary | ICD-10-CM | POA: Diagnosis not present

## 2015-09-07 DIAGNOSIS — R351 Nocturia: Secondary | ICD-10-CM | POA: Diagnosis not present

## 2015-09-07 DIAGNOSIS — N289 Disorder of kidney and ureter, unspecified: Secondary | ICD-10-CM | POA: Diagnosis not present

## 2015-09-07 DIAGNOSIS — R310 Gross hematuria: Secondary | ICD-10-CM | POA: Diagnosis not present

## 2015-09-09 ENCOUNTER — Emergency Department (HOSPITAL_COMMUNITY): Payer: Medicare Other

## 2015-09-09 ENCOUNTER — Inpatient Hospital Stay (HOSPITAL_COMMUNITY)
Admission: EM | Admit: 2015-09-09 | Discharge: 2015-09-13 | DRG: 872 | Disposition: A | Discharge status: Home Health | Payer: Medicare Other | Attending: Internal Medicine | Admitting: Internal Medicine

## 2015-09-09 ENCOUNTER — Encounter (HOSPITAL_COMMUNITY): Payer: Self-pay

## 2015-09-09 DIAGNOSIS — H9190 Unspecified hearing loss, unspecified ear: Secondary | ICD-10-CM | POA: Diagnosis present

## 2015-09-09 DIAGNOSIS — F0280 Dementia in other diseases classified elsewhere without behavioral disturbance: Secondary | ICD-10-CM | POA: Diagnosis present

## 2015-09-09 DIAGNOSIS — N029 Recurrent and persistent hematuria with unspecified morphologic changes: Secondary | ICD-10-CM | POA: Diagnosis present

## 2015-09-09 DIAGNOSIS — I4891 Unspecified atrial fibrillation: Secondary | ICD-10-CM | POA: Diagnosis present

## 2015-09-09 DIAGNOSIS — G309 Alzheimer's disease, unspecified: Secondary | ICD-10-CM | POA: Diagnosis not present

## 2015-09-09 DIAGNOSIS — N281 Cyst of kidney, acquired: Secondary | ICD-10-CM | POA: Diagnosis not present

## 2015-09-09 DIAGNOSIS — Z7902 Long term (current) use of antithrombotics/antiplatelets: Secondary | ICD-10-CM

## 2015-09-09 DIAGNOSIS — N1831 Chronic kidney disease, stage 3a: Secondary | ICD-10-CM | POA: Diagnosis present

## 2015-09-09 DIAGNOSIS — I129 Hypertensive chronic kidney disease with stage 1 through stage 4 chronic kidney disease, or unspecified chronic kidney disease: Secondary | ICD-10-CM | POA: Diagnosis present

## 2015-09-09 DIAGNOSIS — Z881 Allergy status to other antibiotic agents status: Secondary | ICD-10-CM

## 2015-09-09 DIAGNOSIS — D72829 Elevated white blood cell count, unspecified: Secondary | ICD-10-CM | POA: Diagnosis not present

## 2015-09-09 DIAGNOSIS — J9811 Atelectasis: Secondary | ICD-10-CM | POA: Diagnosis not present

## 2015-09-09 DIAGNOSIS — Z888 Allergy status to other drugs, medicaments and biological substances status: Secondary | ICD-10-CM | POA: Diagnosis not present

## 2015-09-09 DIAGNOSIS — R6520 Severe sepsis without septic shock: Secondary | ICD-10-CM | POA: Diagnosis present

## 2015-09-09 DIAGNOSIS — E872 Acidosis: Secondary | ICD-10-CM | POA: Diagnosis not present

## 2015-09-09 DIAGNOSIS — Z91040 Latex allergy status: Secondary | ICD-10-CM | POA: Diagnosis not present

## 2015-09-09 DIAGNOSIS — A4152 Sepsis due to Pseudomonas: Secondary | ICD-10-CM | POA: Diagnosis not present

## 2015-09-09 DIAGNOSIS — I959 Hypotension, unspecified: Secondary | ICD-10-CM | POA: Diagnosis not present

## 2015-09-09 DIAGNOSIS — N401 Enlarged prostate with lower urinary tract symptoms: Secondary | ICD-10-CM | POA: Diagnosis present

## 2015-09-09 DIAGNOSIS — R310 Gross hematuria: Secondary | ICD-10-CM | POA: Insufficient documentation

## 2015-09-09 DIAGNOSIS — Z79899 Other long term (current) drug therapy: Secondary | ICD-10-CM

## 2015-09-09 DIAGNOSIS — R319 Hematuria, unspecified: Secondary | ICD-10-CM

## 2015-09-09 DIAGNOSIS — N390 Urinary tract infection, site not specified: Secondary | ICD-10-CM | POA: Diagnosis present

## 2015-09-09 DIAGNOSIS — R509 Fever, unspecified: Secondary | ICD-10-CM | POA: Diagnosis not present

## 2015-09-09 DIAGNOSIS — A419 Sepsis, unspecified organism: Secondary | ICD-10-CM | POA: Diagnosis not present

## 2015-09-09 DIAGNOSIS — N183 Chronic kidney disease, stage 3 (moderate): Secondary | ICD-10-CM | POA: Diagnosis present

## 2015-09-09 DIAGNOSIS — I482 Chronic atrial fibrillation: Secondary | ICD-10-CM | POA: Diagnosis not present

## 2015-09-09 LAB — URINALYSIS, ROUTINE W REFLEX MICROSCOPIC
Bilirubin Urine: NEGATIVE
GLUCOSE, UA: NEGATIVE mg/dL
KETONES UR: 15 mg/dL — AB
Nitrite: POSITIVE — AB
PH: 5.5 (ref 5.0–8.0)
PROTEIN: 30 mg/dL — AB
Specific Gravity, Urine: 1.019 (ref 1.005–1.030)

## 2015-09-09 LAB — CBC WITH DIFFERENTIAL/PLATELET
Basophils Absolute: 0 10*3/uL (ref 0.0–0.1)
Basophils Relative: 0 %
EOS ABS: 0 10*3/uL (ref 0.0–0.7)
EOS PCT: 0 %
HCT: 32.7 % — ABNORMAL LOW (ref 39.0–52.0)
Hemoglobin: 10.1 g/dL — ABNORMAL LOW (ref 13.0–17.0)
LYMPHS ABS: 0.2 10*3/uL — AB (ref 0.7–4.0)
Lymphocytes Relative: 2 %
MCH: 22.6 pg — ABNORMAL LOW (ref 26.0–34.0)
MCHC: 30.9 g/dL (ref 30.0–36.0)
MCV: 73.3 fL — AB (ref 78.0–100.0)
MONO ABS: 0.1 10*3/uL (ref 0.1–1.0)
Monocytes Relative: 1 %
NEUTROS PCT: 97 %
Neutro Abs: 8.2 10*3/uL — ABNORMAL HIGH (ref 1.7–7.7)
PLATELETS: 232 10*3/uL (ref 150–400)
RBC: 4.46 MIL/uL (ref 4.22–5.81)
RDW: 22.7 % — AB (ref 11.5–15.5)
WBC: 8.5 10*3/uL (ref 4.0–10.5)

## 2015-09-09 LAB — COMPREHENSIVE METABOLIC PANEL
ALK PHOS: 72 U/L (ref 38–126)
ALT: 12 U/L — AB (ref 17–63)
AST: 24 U/L (ref 15–41)
Albumin: 2.8 g/dL — ABNORMAL LOW (ref 3.5–5.0)
Anion gap: 10 (ref 5–15)
BILIRUBIN TOTAL: 0.8 mg/dL (ref 0.3–1.2)
BUN: 19 mg/dL (ref 6–20)
CALCIUM: 8.5 mg/dL — AB (ref 8.9–10.3)
CO2: 21 mmol/L — ABNORMAL LOW (ref 22–32)
CREATININE: 2.02 mg/dL — AB (ref 0.61–1.24)
Chloride: 107 mmol/L (ref 101–111)
GFR, EST AFRICAN AMERICAN: 32 mL/min — AB (ref >60)
GFR, EST NON AFRICAN AMERICAN: 27 mL/min — AB (ref >60)
Glucose, Bld: 139 mg/dL — ABNORMAL HIGH (ref 65–99)
Potassium: 4 mmol/L (ref 3.5–5.1)
Sodium: 138 mmol/L (ref 135–145)
TOTAL PROTEIN: 5.3 g/dL — AB (ref 6.5–8.1)

## 2015-09-09 LAB — URINE MICROSCOPIC-ADD ON

## 2015-09-09 LAB — I-STAT CG4 LACTIC ACID, ED
LACTIC ACID, VENOUS: 2.25 mmol/L — AB (ref 0.5–1.9)
Lactic Acid, Venous: 3.86 mmol/L (ref 0.5–1.9)

## 2015-09-09 LAB — APTT: aPTT: 23 seconds — ABNORMAL LOW (ref 24–37)

## 2015-09-09 LAB — MRSA PCR SCREENING: MRSA by PCR: NEGATIVE

## 2015-09-09 LAB — PROTIME-INR
INR: 1.11 (ref 0.00–1.49)
Prothrombin Time: 14.5 seconds (ref 11.6–15.2)

## 2015-09-09 LAB — POC OCCULT BLOOD, ED: FECAL OCCULT BLD: POSITIVE — AB

## 2015-09-09 LAB — MAGNESIUM: MAGNESIUM: 1.6 mg/dL — AB (ref 1.7–2.4)

## 2015-09-09 MED ORDER — DEXTROSE 5 % IV SOLN
1.0000 g | INTRAVENOUS | Status: DC
  Administered 2015-09-09: 1 g via INTRAVENOUS
  Filled 2015-09-09 (×2): qty 10

## 2015-09-09 MED ORDER — FAMOTIDINE IN NACL 20-0.9 MG/50ML-% IV SOLN
20.0000 mg | Freq: Once | INTRAVENOUS | Status: AC
  Administered 2015-09-09: 20 mg via INTRAVENOUS
  Filled 2015-09-09: qty 50

## 2015-09-09 MED ORDER — LEVOTHYROXINE SODIUM 50 MCG PO TABS
50.0000 ug | ORAL_TABLET | Freq: Every day | ORAL | Status: DC
  Administered 2015-09-10 – 2015-09-13 (×4): 50 ug via ORAL
  Filled 2015-09-09 (×4): qty 1

## 2015-09-09 MED ORDER — ACETAMINOPHEN 650 MG RE SUPP: 650.0000 mg | Freq: Four times a day (QID) | RECTAL | Status: DC | PRN

## 2015-09-09 MED ORDER — ACETAMINOPHEN 325 MG PO TABS: 650.0000 mg | ORAL_TABLET | Freq: Four times a day (QID) | ORAL | Status: DC | PRN

## 2015-09-09 MED ORDER — LEVOFLOXACIN IN D5W 500 MG/100ML IV SOLN: 500.0000 mg | INTRAVENOUS | Status: DC

## 2015-09-09 MED ORDER — DEXTROSE 5 % IV SOLN
500.0000 mg | Freq: Three times a day (TID) | INTRAVENOUS | Status: DC
  Filled 2015-09-09 (×2): qty 0.5

## 2015-09-09 MED ORDER — LEVOFLOXACIN IN D5W 750 MG/150ML IV SOLN
750.0000 mg | Freq: Once | INTRAVENOUS | Status: AC
  Administered 2015-09-09: 750 mg via INTRAVENOUS
  Filled 2015-09-09: qty 150

## 2015-09-09 MED ORDER — DEXTROSE 5 % IV SOLN: 2.0000 g | Freq: Once | INTRAVENOUS | Status: DC

## 2015-09-09 MED ORDER — SODIUM CHLORIDE 0.9 % IV SOLN
INTRAVENOUS | Status: DC
  Administered 2015-09-09: 1000 mL via INTRAVENOUS

## 2015-09-09 MED ORDER — SODIUM CHLORIDE 0.9 % IV BOLUS (SEPSIS)
250.0000 mL | Freq: Once | INTRAVENOUS | Status: AC
  Administered 2015-09-09: 250 mL via INTRAVENOUS

## 2015-09-09 MED ORDER — SODIUM CHLORIDE 0.9% FLUSH
3.0000 mL | Freq: Two times a day (BID) | INTRAVENOUS | Status: DC
  Administered 2015-09-09 – 2015-09-12 (×6): 3 mL via INTRAVENOUS

## 2015-09-09 MED ORDER — SODIUM CHLORIDE 0.9 % IV BOLUS (SEPSIS)
1000.0000 mL | Freq: Once | INTRAVENOUS | Status: AC
  Administered 2015-09-09: 1000 mL via INTRAVENOUS

## 2015-09-09 MED ORDER — DEXTROSE 5 % IV SOLN
1.0000 g | Freq: Once | INTRAVENOUS | Status: AC
  Administered 2015-09-09: 1 g via INTRAVENOUS
  Filled 2015-09-09: qty 1

## 2015-09-09 MED ORDER — PANTOPRAZOLE SODIUM 40 MG PO TBEC
40.0000 mg | DELAYED_RELEASE_TABLET | Freq: Every day | ORAL | Status: DC
  Administered 2015-09-10 – 2015-09-13 (×4): 40 mg via ORAL
  Filled 2015-09-09 (×4): qty 1

## 2015-09-09 NOTE — ED Notes (Addendum)
Pt unable to void to urinal at this time. Pt had clean brief applied and small amount of blood noted at meatus.

## 2015-09-09 NOTE — ED Notes (Signed)
4beat run of vtach noted. PA aware and at bedside

## 2015-09-09 NOTE — ED Notes (Signed)
Attempted to obtain urine specimen; Pt unable to provide one at this time 

## 2015-09-09 NOTE — ED Notes (Signed)
PCXR at bedside.

## 2015-09-09 NOTE — ED Notes (Signed)
Attempt to call report.

## 2015-09-09 NOTE — H&P (Signed)
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  Presents to the ER with blood in urine that he describes as bright red, "like blood from your vein".  He was see in Dr. Arlyn Leak office on 7/06 and has cystoscopy for hematuria.  The hematuria worsened after scope.  Patient states no cancer nor stones were found but Dr. Gaynelle Arabian planned to do a CT scan on Monday.  He was also given Keflex which he has been taking without reaction.  Patient was found to have fever by EMS and blood dripping down leg.  Denies fever but does admit to dysuria  ED Course:  Hgb was found to be stable as well as Cr. On labs ER PA spoke with Dr. Jeffie Pollock of urology that said cystoscopy showed prostate as the probable source of bleeding as it was enlarged and friable.  He was also started on Jalyn. hospitalist were asked to admit with a consult by urology   Review of Systems: all systems reviewed, negative unless stated above in HPI   Past Medical History  Diagnosis Date  . Atrial fibrillation ([place])   . Dementia   . Chronic kidney disease   . Anemia due to chronic blood loss 07/2015  . Cholecystitis   . CKD (chronic kidney disease)   . Alzheimer disease   . DOE (dyspnea on exertion)     Past Surgical History  Procedure Laterality Date  . Abdominal surgery      part of stomach removed  . Partial gastrectomy Bilateral   . Cholecystectomy N/A 04/27/2013    Procedure: LAPAROSCOPIC CHOLECYSTECTOMY WITH INTRAOPERATIVE CHOLANGIOGRAM;  Surgeon: Imogene Burn. [name], MD;  Location: [facility];  Service: General;  Laterality: N/A;  . Laparoscopic lysis of adhesions N/A 04/27/2013    Procedure: LAPAROSCOPIC LYSIS OF ADHESIONS;  Surgeon:  Imogene Burn. [name], MD;  Location: [place];  Service: General;  Laterality: N/A;  . Esophagogastroduodenoscopy N/A 07/30/2015    Procedure: ESOPHAGOGASTRODUODENOSCOPY (EGD);  Surgeon: Ronald Lobo, MD;  Location: [facility] ENDOSCOPY;  Service: Endoscopy;  Laterality: N/A;  Pediatric UPPER endoscope, please     reports that he has never smoked. He has never used smokeless tobacco. He reports that he does not drink alcohol or use illicit drugs.  Allergies  Allergen Reactions  . Ceftriaxone Other (See Comments)    No reaction listed 7/8 Pt tolerating Keflex as outpatient  . Cephalosporins     Unknown, previously documented in [place], pt does not recall reactions 7/8:Patient tolerating Keflex as outpatient.   . Latex   . Savaysa [Edoxaban] Other (See Comments)    Unknown    History reviewed. No pertinent family history.   Prior to Admission medications   Medication Sig Start Date End Date Taking? Authorizing Provider  cephALEXin (KEFLEX) 250 MG capsule Take 250 mg by mouth 2 (two) times daily.   Yes Historical Provider, MD  chlorhexidine (PERIDEX) 0.12 % solution Use as directed 15 mLs in the mouth or throat 2 (two) times daily.   Yes Historical Provider, MD  Cholecalciferol (VITAMIN D-3) 1000 UNITS CAPS Take 3,000 Units by mouth daily.    Yes Historical Provider, MD  Dutasteride-Tamsulosin HCl 0.5-0.4 MG CAPS Take 1 capsule by mouth daily.   Yes  Historical Provider, MD  edoxaban (SAVAYSA) 30 MG TABS tablet Take 30 mg by mouth daily.   Yes Historical Provider, MD  levothyroxine (SYNTHROID, LEVOTHROID) 50 MCG tablet Take 50 mcg by mouth daily before breakfast.  01/28/14  Yes Historical Provider, MD  Multiple Vitamins-Minerals (MULTIVITAMIN ADULTS 50+) TABS Take 1 tablet by mouth daily.   Yes Historical Provider, MD  naproxen sodium (ANAPROX) 550 MG tablet Take 550 mg by mouth 2 (two) times daily as needed for mild pain.   Yes Historical Provider, MD  pantoprazole (PROTONIX) 40 MG tablet Take 1  tablet (40 mg total) by mouth daily. 07/31/15  Yes Ripudeep Krystal Eaton, MD    Physical Exam: Filed Vitals:   09/09/15 1530 09/09/15 1533 09/09/15 1545 09/09/15 1600  BP: 93/55  96/51 91/58  Pulse: 71  71 70  Temp:  97.4 F (36.3 C)    TempSrc:      Resp: 12  14 16   Height:      Weight:      SpO2: 100%  100% 100%      Constitutional: NAD, calm, comfortable Filed Vitals:   09/09/15 1530 09/09/15 1533 09/09/15 1545 09/09/15 1600  BP: 93/55  96/51 91/58  Pulse: 71  71 70  Temp:  97.4 F (36.3 C)    TempSrc:      Resp: 12  14 16   Height:      Weight:      SpO2: 100%  100% 100%   Eyes: PERRL, lids and conjunctivae normal ENMT: Mucous membranes are moist. Posterior pharynx clear of any exudate or lesions.Normal dentition.  Neck: normal, supple, no masses, no thyromegaly Respiratory: clear to auscultation bilaterally, no wheezing, no crackles. Normal respiratory effort. No accessory muscle use.  Cardiovascular: irrgular, + murmur / rubs / gallops. No extremity edema. Abdomen: no tenderness, no masses palpated. No hepatosplenomegaly. Bowel sounds positive.  Musculoskeletal: no clubbing / cyanosis. No joint deformity upper and lower extremities. Good ROM, no contractures. Normal muscle tone.  Skin: no rashes, lesions, ulcers. No induration-- does have blood drops on feet Neurologic: no focal deficit, moves all 4 ext Psychiatric: Normal judgment and insight. Hard of hearing but appropriate Foley in place, golden color urine -BRB around meatus   Labs on Admission: I have personally reviewed following labs and imaging studies  CBC:  Recent Labs Lab 09/09/15 1120  WBC 8.5  NEUTROABS 8.2*  HGB 10.1*  HCT 32.7*  MCV 73.3*  PLT [id number]   Basic Metabolic Panel:  Recent Labs Lab 09/09/15 1120  NA 138  K 4.0  CL 107  CO2 21*  GLUCOSE 139*  BUN 19  CREATININE 2.02*  CALCIUM 8.5*   GFR: Estimated Creatinine Clearance: 22.7 mL/min (by C-G formula based on Cr of 2.02). Liver  Function Tests:  Recent Labs Lab 09/09/15 1120  AST 24  ALT 12*  ALKPHOS 72  BILITOT 0.8  PROT 5.3*  ALBUMIN 2.8*   No results for input(s): LIPASE, AMYLASE in the last 168 hours. No results for input(s): AMMONIA in the last 168 hours. Coagulation Profile:  Recent Labs Lab 09/09/15 1120  INR 1.11   Cardiac Enzymes: No results for input(s): CKTOTAL, CKMB, CKMBINDEX, TROPONINI in the last 168 hours. BNP (last 3 results) No results for input(s): PROBNP in the last 8760 hours. HbA1C: No results for input(s): HGBA1C in the last 72 hours. CBG: No results for input(s): GLUCAP in the last 168 hours. Lipid Profile: No results for input(s): CHOL, HDL, LDLCALC, TRIG, CHOLHDL,  LDLDIRECT in the last 72 hours. Thyroid Function Tests: No results for input(s): TSH, T4TOTAL, FREET4, T3FREE, THYROIDAB in the last 72 hours. Anemia Panel: No results for input(s): VITAMINB12, FOLATE, FERRITIN, TIBC, IRON, RETICCTPCT in the last 72 hours. Urine analysis:    Component Value Date/Time   COLORURINE AMBER* 09/09/2015 1412   APPEARANCEUR CLOUDY* 09/09/2015 1412   LABSPEC 1.019 09/09/2015 1412   PHURINE 5.5 09/09/2015 1412   GLUCOSEU NEGATIVE 09/09/2015 1412   HGBUR LARGE* 09/09/2015 1412   BILIRUBINUR NEGATIVE 09/09/2015 1412   KETONESUR 15* 09/09/2015 1412   PROTEINUR 30* 09/09/2015 1412   UROBILINOGEN 1.0 04/24/2013 1420   NITRITE POSITIVE* 09/09/2015 1412   LEUKOCYTESUR MODERATE* 09/09/2015 1412   Sepsis Labs: Invalid input(s): PROCALCITONIN, LACTICIDVEN Recent Results (from the past 240 hour(s))  Blood Culture (routine x 2)     Status: None (Preliminary result)   Collection Time: 09/09/15 11:20 AM  Result Value Ref Range Status   Specimen Description BLOOD RIGHT FOREARM  Final   Special Requests BOTTLES DRAWN AEROBIC AND ANAEROBIC  5CC  Final   Culture PENDING  Incomplete   Report Status PENDING  Incomplete     Radiological Exams on Admission: Dg Chest Port 1 View  09/09/2015   CLINICAL DATA:  Hematuria for 6 days, sepsis, atrial fibrillation, dementia/Alzheimer's, chronic kidney disease EXAM: PORTABLE CHEST 1 VIEW COMPARISON:  Portable exam 1241 hours compared to 05/09/2008 FINDINGS: Minimal enlargement of cardiac silhouette. Atherosclerotic calcification and tortuosity of thoracic aorta. Mediastinal contours and pulmonary vascularity normal. Bronchitic changes with minimal bibasilar atelectasis. Lungs otherwise clear. No pleural effusion or pneumothorax. Bones demineralized. IMPRESSION: Minimal enlargement of cardiac silhouette. Bronchitic changes with minimal bibasilar atelectasis. Aortic atherosclerosis. Electronically Signed   By: Lavonia Dana M.D.   On: 09/09/2015 12:57    EKG: Independently reviewed. Atrial fib  Assessment/Plan Active Problems:   Chronic kidney disease (CKD) stage G3a/A3, moderately decreased glomerular filtration rate (GFR) between 45-59 mL/min/1.73 square meter and albuminuria creatinine ratio greater than 300 mg/g   Atrial fibrillation (HCC)   Alzheimer disease   Hematuria   Hypotension   Hematuria with penile bleeding (appears to be prostate)- Hgb Stable, check again AM after gentle IVF, urine currently golden in color but still has oozing around meatus of BRB -urology consult as recent scope in office and started on keflex as outpatient  ?UTI -started on keflex as outpatient- continue rocephin inpatient -urine culture  Atrial fib -hold blood thinner today due to risk of bleeding -rate controlled  Dementia -appears at baseline  Hypotension -IVF -IV abx -recheck lactic acid as initially elevated  CKD- III -at baseline-- GFR 27-29   DVT prophylaxis: SCD due to hematuria Code Status: full Family Communication: patient Disposition Plan: from [employer] called: urology- Jeffie Pollock Admission status: inpt-- SDU   [place] DO [facility] Pager [phone]  If 7PM-7AM, please contact  night-coverage [email or web address] Password [facility]  09/09/2015, 4:47 PM

## 2015-09-09 NOTE — Progress Notes (Addendum)
Addendum: Labs now reported- SCr 2.02, consistent with prior renal function. WBC wnl. Tm 99.2. Hypotensive, ST. LA elevated 3.86.   Plan: Schedule Aztreonam 500mg  IV every 8 hours.  Schedule Levaquin 500mg  IV every 48 hours- next dose 7/10.   Sloan Leiter, PharmD, BCPS Clinical Pharmacist [phone] 09/09/2015,12:57 PM  Pharmacy Antibiotic Note  Justin Chambers is a 82 y.o. male admitted on 09/09/2015 with UTI.  Pharmacy has been consulted for Levaquin and Aztreonam (allergic to cephalosporins) dosing. No history of drug resistant organisms in past. Patient noted to have blood in urine for last 6 days and currently with fever. Patient has been on Keflex for the last 4 days as outpatient and tolerating with out reaction.   Labs pending. Last SCr on 08/20/15 was 1.91, CrCl ~20 -25 mL/min.  Code sepsis called 11:24 AM  Hand-delivered antibiotics to RN at 11:30 AM.   Plan: Decreased to Aztreonam 1g IV x1 due to historic renal function. Levaquin 750mg  IV x1 as ordered.  Follow-up labs for further dosing Consider changing to Ceftriaxone if deem appropriate as patient was tolerating Keflex as outpatient.     No data recorded.  No results for input(s): WBC, CREATININE, LATICACIDVEN, VANCOTROUGH, VANCOPEAK, VANCORANDOM, GENTTROUGH, GENTPEAK, GENTRANDOM, TOBRATROUGH, TOBRAPEAK, TOBRARND, AMIKACINPEAK, AMIKACINTROU, AMIKACIN in the last 168 hours.  CrCl cannot be calculated (Unknown ideal weight.).    Allergies  Allergen Reactions  . Ceftriaxone Other (See Comments)    No reaction listed  . Cephalosporins     Unknown, previously documented in [place], pt does not recall reactions  . Latex   . Savaysa [Edoxaban] Other (See Comments)    Unknown    Antimicrobials this admission: Aztreonam 7/8 >> Levaquin 7/8 >>  Dose adjustments this admission: n/a  Microbiology results: 7/8 BCx:  7/8 UCx:   Thank you for allowing pharmacy to be a part of this patient's care.  Sloan Leiter,  PharmD, BCPS Clinical Pharmacist [phone] 09/09/2015 11:16 AM

## 2015-09-09 NOTE — Progress Notes (Signed)
Received from ED; CHG completed. CCMD notified. Bed Alarm on.

## 2015-09-09 NOTE — ED Provider Notes (Signed)
CSN: [id number]     Arrival date & time 09/09/15  1053 History   First MD Initiated Contact with Patient 09/09/15 1056     Chief Complaint  Patient presents with  . Hematuria    (Consider location/radiation/quality/duration/timing/severity/associated sxs/prior Treatment) Patient is a 82 y.o. male presenting with hematuria. The history is provided by the patient and medical records. No language interpreter was used.  Hematuria Associated symptoms include a fever. Pertinent negatives include no abdominal pain, coughing, headaches, myalgias, nausea or vomiting.   Justin Chambers is a 82 y.o. male  with a PMH of afib on savaysa, CKD, dementia who presents to the Emergency Department from [place] [facility] via EMS for large amount of blood in urine which was dripping down legs. Patient has been experiencing painless hematuria over the last week. He was seen by Dr. Gaynelle Arabian on Thursday, 7/06 where he underwent cystoscopy and was started on Keflex. Daughter at bedside states he was suppose to take ABX 4 times daily and has been compliant, approx. 6 doses taken. Patient does admit to dysuria that began yesterday. Also endorses weakness. Denies abdominal pain, back pain, n/v/d, testicular/penile pain, chest pain, sob. Per EMS, fever of 101 en route along with bigeminy noted on EKG. 700 mg tylenol given PO prior to arrival.    Past Medical History  Diagnosis Date  . Atrial fibrillation ([place])   . Dementia   . Chronic kidney disease   . Anemia due to chronic blood loss 07/2015  . Cholecystitis   . CKD (chronic kidney disease)   . Alzheimer disease   . DOE (dyspnea on exertion)    Past Surgical History  Procedure Laterality Date  . Abdominal surgery      part of stomach removed  . Partial gastrectomy Bilateral   . Cholecystectomy N/A 04/27/2013    Procedure: LAPAROSCOPIC CHOLECYSTECTOMY WITH INTRAOPERATIVE CHOLANGIOGRAM;  Surgeon: Imogene Burn. [name], MD;  Location: [place];  Service: General;   Laterality: N/A;  . Laparoscopic lysis of adhesions N/A 04/27/2013    Procedure: LAPAROSCOPIC LYSIS OF ADHESIONS;  Surgeon: Imogene Burn. [name], MD;  Location: [place];  Service: General;  Laterality: N/A;  . Esophagogastroduodenoscopy N/A 07/30/2015    Procedure: ESOPHAGOGASTRODUODENOSCOPY (EGD);  Surgeon: Ronald Lobo, MD;  Location: [facility] ENDOSCOPY;  Service: Endoscopy;  Laterality: N/A;  Pediatric UPPER endoscope, please   History reviewed. No pertinent family history. Social History  Substance Use Topics  . Smoking status: Never Smoker   . Smokeless tobacco: Never Used  . Alcohol Use: No    Review of Systems  Constitutional: Positive for fever.  HENT: Negative for trouble swallowing.   Eyes: Negative for visual disturbance.  Respiratory: Negative for cough and shortness of breath.   Cardiovascular: Negative.   Gastrointestinal: Negative for nausea, vomiting, abdominal pain and diarrhea.  Genitourinary: Positive for dysuria and hematuria. Negative for penile swelling, scrotal swelling, penile pain and testicular pain.  Musculoskeletal: Negative for myalgias.  Skin: Negative for wound.  Neurological: Negative for headaches.      Allergies  Ceftriaxone; Cephalosporins; Latex; and Savaysa  Home Medications   Prior to Admission medications   Medication Sig Start Date End Date Taking? Authorizing Provider  cephALEXin (KEFLEX) 250 MG capsule Take 250 mg by mouth 2 (two) times daily.   Yes Historical Provider, MD  chlorhexidine (PERIDEX) 0.12 % solution Use as directed 15 mLs in the mouth or throat 2 (two) times daily.   Yes Historical Provider, MD  Cholecalciferol (VITAMIN D-3) 1000  UNITS CAPS Take 3,000 Units by mouth daily.    Yes Historical Provider, MD  Dutasteride-Tamsulosin HCl 0.5-0.4 MG CAPS Take 1 capsule by mouth daily.   Yes Historical Provider, MD  edoxaban (SAVAYSA) 30 MG TABS tablet Take 30 mg by mouth daily.   Yes Historical Provider, MD  levothyroxine (SYNTHROID,  LEVOTHROID) 50 MCG tablet Take 50 mcg by mouth daily before breakfast.  01/28/14  Yes Historical Provider, MD  Multiple Vitamins-Minerals (MULTIVITAMIN ADULTS 50+) TABS Take 1 tablet by mouth daily.   Yes Historical Provider, MD  naproxen sodium (ANAPROX) 550 MG tablet Take 550 mg by mouth 2 (two) times daily as needed for mild pain.   Yes Historical Provider, MD  pantoprazole (PROTONIX) 40 MG tablet Take 1 tablet (40 mg total) by mouth daily. 07/31/15  Yes Ripudeep K Rai, MD   BP 93/55 mmHg  Pulse 71  Temp(Src) 97.4 F (36.3 C) (Oral)  Resp 12  Ht 5\' 7"  (1.702 m)  Wt 67.132 kg  BMI 23.17 kg/m2  SpO2 100% Physical Exam  Constitutional: He is oriented to person, place, and time. He appears well-developed and well-nourished.  HENT:  Head: Normocephalic and atraumatic.  Cardiovascular: Normal rate and intact distal pulses.  Exam reveals no gallop and no friction rub.   Murmur (Systolic 4/6) heard. Pulmonary/Chest: No respiratory distress. He has no wheezes. He has no rales. He exhibits no tenderness.  Increased effort in breathing. Equal chest expansion.   Abdominal: Soft. Bowel sounds are normal. He exhibits no distension.  Mild suprapubic TTP. No other abdominal tenderness.   Genitourinary:  Chaperone present for exam. Patient with large amount of blood in brief cotton padding which appears to be coming from meatus. No signs of lesions on the penis or testicles. No source of bleeding from any other site in area. The penis and testicles are nontender. No testicular masses or swelling.  Musculoskeletal: He exhibits no edema.  Neurological: He is alert and oriented to person, place, and time.  Skin: Skin is warm and dry.  Blood spatter on feet - per EMS, this was from hematuria earlier today.   Nursing note and vitals reviewed.   ED Course  Procedures (including critical care time)  CRITICAL CARE Performed by: Ozella Almond Derrico Zhong   Total critical care time: 45 minutes  Critical  care time was exclusive of separately billable procedures and treating other patients.  Critical care was necessary to treat or prevent imminent or life-threatening deterioration.  Critical care was time spent personally by me on the following activities: development of treatment plan with patient and/or surrogate as well as nursing, discussions with consultants, evaluation of patient's response to treatment, examination of patient, obtaining history from patient or surrogate, ordering and performing treatments and interventions, ordering and review of laboratory studies, ordering and review of radiographic studies, pulse oximetry and re-evaluation of patient's condition.   Labs Review Labs Reviewed  COMPREHENSIVE METABOLIC PANEL - Abnormal; Notable for the following:    CO2 21 (*)    Glucose, Bld 139 (*)    Creatinine, Ser 2.02 (*)    Calcium 8.5 (*)    Total Protein 5.3 (*)    Albumin 2.8 (*)    ALT 12 (*)    GFR calc non Af Amer 27 (*)    GFR calc Af Amer 32 (*)    All other components within normal limits  CBC WITH DIFFERENTIAL/PLATELET - Abnormal; Notable for the following:    Hemoglobin 10.1 (*)  HCT 32.7 (*)    MCV 73.3 (*)    MCH 22.6 (*)    RDW 22.7 (*)    Neutro Abs 8.2 (*)    Lymphs Abs 0.2 (*)    All other components within normal limits  URINALYSIS, ROUTINE W REFLEX MICROSCOPIC (NOT AT [facility]) - Abnormal; Notable for the following:    Color, Urine AMBER (*)    APPearance CLOUDY (*)    Hgb urine dipstick LARGE (*)    Ketones, ur 15 (*)    Protein, ur 30 (*)    Nitrite POSITIVE (*)    Leukocytes, UA MODERATE (*)    All other components within normal limits  APTT - Abnormal; Notable for the following:    aPTT 23 (*)    All other components within normal limits  URINE MICROSCOPIC-ADD ON - Abnormal; Notable for the following:    Squamous Epithelial / LPF 0-5 (*)    Bacteria, UA MANY (*)    Casts HYALINE CASTS (*)    All other components within normal limits   I-STAT CG4 LACTIC ACID, ED - Abnormal; Notable for the following:    Lactic Acid, Venous 3.86 (*)    All other components within normal limits  POC OCCULT BLOOD, ED - Abnormal; Notable for the following:    Fecal Occult Bld POSITIVE (*)    All other components within normal limits  I-STAT CG4 LACTIC ACID, ED - Abnormal; Notable for the following:    Lactic Acid, Venous 2.25 (*)    All other components within normal limits  CULTURE, BLOOD (ROUTINE X 2)  CULTURE, BLOOD (ROUTINE X 2)  URINE CULTURE  PROTIME-INR  MAGNESIUM    Imaging Review Dg Chest Port 1 View  09/09/2015  CLINICAL DATA:  Hematuria for 6 days, sepsis, atrial fibrillation, dementia/Alzheimer's, chronic kidney disease EXAM: PORTABLE CHEST 1 VIEW COMPARISON:  Portable exam 1241 hours compared to 05/09/2008 FINDINGS: Minimal enlargement of cardiac silhouette. Atherosclerotic calcification and tortuosity of thoracic aorta. Mediastinal contours and pulmonary vascularity normal. Bronchitic changes with minimal bibasilar atelectasis. Lungs otherwise clear. No pleural effusion or pneumothorax. Bones demineralized. IMPRESSION: Minimal enlargement of cardiac silhouette. Bronchitic changes with minimal bibasilar atelectasis. Aortic atherosclerosis. Electronically Signed   By: Lavonia Dana M.D.   On: 09/09/2015 12:57   I have personally reviewed and evaluated these images and lab results as part of my medical decision-making.   EKG Interpretation None      MDM   Final diagnoses:  Sepsis, due to unspecified organism [facility])   Justin Chambers is a 82 y.o. male who presents to ED hypotensive and febrile with large amount of hematuria. Patient of Dr. Gaynelle Arabian who recently underwent cystoscopy for hematuria on Thursday. Bleeding today much worse than before. Upon arrival, A&Ox3. Patient complaining of dysuria with mild suprapubic ttp. Abd. Soft and ND. No peritoneal signs. Concern for urosepsis. Code sepsis called. Blood cx obtained.    Lactic of 3.86. CBC with normal white count. CBC with H&H of 10.1/32.7 (10.2/33.4 two weeks prior).  Repeat lactic 2.25. BP remains low. Continuing to monitor closely.   Consults: Spoke with urology, Dr. Jeffie Pollock who states that urine in the office Thursday had a large amount of blood, but no signs of infection. Urine culture is still pending. Cystoscopy showed prostate as probably source of bleeding which was large and friable. Started on [place]. Urology will consult during hospital stay.   Patient seen by and discussed with Dr. Jeanell Sparrow who agrees with treatment  plan.     Ozella Almond Shep Porter, PA-C 09/09/15 1554  Pattricia Boss, MD 09/09/15 [phone]

## 2015-09-09 NOTE — ED Notes (Addendum)
Pt arrives EMS with c/o blood in urine for last 6 days. SHOB and Bigeminy noted. Fever noted by EMS. EMS states gave 700 mg tylenol PO.

## 2015-09-09 NOTE — ED Notes (Signed)
[place] PA aware of output. States to watch for now.

## 2015-09-10 LAB — BLOOD CULTURE ID PANEL (REFLEXED)
ACINETOBACTER BAUMANNII: NOT DETECTED
CARBAPENEM RESISTANCE: NOT DETECTED
Candida albicans: NOT DETECTED
Candida glabrata: NOT DETECTED
Candida krusei: NOT DETECTED
Candida parapsilosis: NOT DETECTED
Candida tropicalis: NOT DETECTED
ENTEROBACTERIACEAE SPECIES: NOT DETECTED
Enterobacter cloacae complex: NOT DETECTED
Enterococcus species: NOT DETECTED
Escherichia coli: NOT DETECTED
HAEMOPHILUS INFLUENZAE: NOT DETECTED
Klebsiella oxytoca: NOT DETECTED
Klebsiella pneumoniae: NOT DETECTED
LISTERIA MONOCYTOGENES: NOT DETECTED
METHICILLIN RESISTANCE: NOT DETECTED
NEISSERIA MENINGITIDIS: NOT DETECTED
Proteus species: NOT DETECTED
Pseudomonas aeruginosa: DETECTED — AB
SERRATIA MARCESCENS: NOT DETECTED
STAPHYLOCOCCUS AUREUS BCID: NOT DETECTED
STAPHYLOCOCCUS SPECIES: NOT DETECTED
STREPTOCOCCUS PYOGENES: NOT DETECTED
STREPTOCOCCUS SPECIES: NOT DETECTED
Streptococcus agalactiae: NOT DETECTED
Streptococcus pneumoniae: NOT DETECTED
VANCOMYCIN RESISTANCE: NOT DETECTED

## 2015-09-10 LAB — CBC
HEMATOCRIT: 27.7 % — AB (ref 39.0–52.0)
HEMOGLOBIN: 8.5 g/dL — AB (ref 13.0–17.0)
MCH: 22.8 pg — AB (ref 26.0–34.0)
MCHC: 30.7 g/dL (ref 30.0–36.0)
MCV: 74.3 fL — AB (ref 78.0–100.0)
Platelets: 189 10*3/uL (ref 150–400)
RBC: 3.73 MIL/uL — ABNORMAL LOW (ref 4.22–5.81)
RDW: 23 % — ABNORMAL HIGH (ref 11.5–15.5)
WBC: 27.3 10*3/uL — ABNORMAL HIGH (ref 4.0–10.5)

## 2015-09-10 LAB — BASIC METABOLIC PANEL
ANION GAP: 6 (ref 5–15)
BUN: 23 mg/dL — AB (ref 6–20)
CHLORIDE: 110 mmol/L (ref 101–111)
CO2: 21 mmol/L — AB (ref 22–32)
Calcium: 7.8 mg/dL — ABNORMAL LOW (ref 8.9–10.3)
Creatinine, Ser: 1.98 mg/dL — ABNORMAL HIGH (ref 0.61–1.24)
GFR calc Af Amer: 32 mL/min — ABNORMAL LOW (ref >60)
GFR, EST NON AFRICAN AMERICAN: 28 mL/min — AB (ref >60)
GLUCOSE: 134 mg/dL — AB (ref 65–99)
POTASSIUM: 4.1 mmol/L (ref 3.5–5.1)
Sodium: 137 mmol/L (ref 135–145)

## 2015-09-10 MED ORDER — TRAZODONE HCL 50 MG PO TABS
50.0000 mg | ORAL_TABLET | Freq: Once | ORAL | Status: AC
  Administered 2015-09-11: 50 mg via ORAL
  Filled 2015-09-10: qty 1

## 2015-09-10 MED ORDER — CETYLPYRIDINIUM CHLORIDE 0.05 % MT LIQD
7.0000 mL | Freq: Two times a day (BID) | OROMUCOSAL | Status: DC
  Administered 2015-09-10 – 2015-09-13 (×5): 7 mL via OROMUCOSAL

## 2015-09-10 MED ORDER — PIPERACILLIN-TAZOBACTAM 3.375 G IVPB
3.3750 g | Freq: Three times a day (TID) | INTRAVENOUS | Status: DC
  Administered 2015-09-10 – 2015-09-12 (×7): 3.375 g via INTRAVENOUS
  Filled 2015-09-10 (×9): qty 50

## 2015-09-10 NOTE — Progress Notes (Signed)
PROGRESS NOTE  Justin Chambers  [id number] DOB: November 24, 1923 DOA: 09/09/2015 PCP: Geoffery Lyons, MD Outpatient Specialists:  Subjective: Feels okay, denies any complaints this morning his urine is clearing up.  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 Presents to the ER with blood in urine that he describes as bright red, "like blood from your vein". He was see in Dr. Arlyn Leak office on 7/06 and has cystoscopy for hematuria. The hematuria worsened after scope. Patient states no cancer nor stones were found but Dr. Gaynelle Arabian planned to do a CT scan on Monday. He was also given Keflex which he has been taking without reaction. Patient was found to have fever by EMS and blood dripping down leg. Denies fever but does admit to dysuria  ED Course:  Hgb was found to be stable as well as Cr. On labs ER PA spoke with Dr. Jeffie Pollock of urology that said cystoscopy showed prostate as the probable source of bleeding as it was enlarged and friable. He was also started on Jalyn. hospitalist were asked to admit with a consult by urology  Assessment & Plan:   Active Problems:   Chronic kidney disease (CKD) stage G3a/A3, moderately decreased glomerular filtration rate (GFR) between 45-59 mL/min/1.73 square meter and albuminuria creatinine ratio greater than 300 mg/g   Atrial fibrillation (HCC)   Alzheimer disease   Hematuria   Hypotension   Severe sepsis -Patient presented with fever of 103, WBC of 27.3 and hypertension in the presence of UTI/bacteremia. -The lactic acid is 3.86 indicating or end organ damage. -Hypotension is nicely responded to IV fluid boluses. -In the IV fluids and IV antibiotics.  Gram-negative bacteremia likely Pseudomonas -Blood culture showed gram-negative rods, BCID detecting Pseudomonas. -Initially patient was on Rocephin, this was switched to Zosyn, await final  sensitivities.  Hematuria with penile bleeding (appears to be prostate) - Hgb Stable, check again AM after gentle IVF, urine currently golden in color but still has oozing around meatus of BRB -urology consult as recent scope in office and started on keflex as outpatient  UTI -started on keflex as outpatient- continue rocephin inpatient -urine culture, pending  A fib -Rate is controlled, continue home medications. -CHA2DS2-VASc is probably too for age, on 27 -Hold anticoagulation because of concurrent hematuria.  Dementia -appears at baseline  Hypotension -Blood pressure was 95/53 on admission, given IV fluids. -His BP is over 100 this morning.  CKD- III -At baseline, creatinine baseline 1.9.   DVT prophylaxis:  Code Status: Full Code Family Communication:  Disposition Plan:  Diet: Diet Heart Room service appropriate?: Yes; Fluid consistency:: Thin  Consultants:   Urology  Procedures:   None  Antimicrobials:   Zosyn   Objective: Filed Vitals:   09/10/15 0030 09/10/15 0400 09/10/15 0442 09/10/15 0700  BP: 104/51 113/67  112/57  Pulse: 65 64  69  Temp:   98.6 F (37 C) 97.4 F (36.3 C)  TempSrc:   Oral Oral  Resp: 16 17  20   Height:      Weight:      SpO2: 99% 99%  100%    Intake/Output Summary (Last 24 hours) at 09/10/15 1054 Last data filed at 09/10/15 0900  Gross per 24 hour  Intake  865.5 ml  Output    400 ml  Net  465.5 ml   Filed Weights   09/09/15 1127  Weight: 67.132 kg (148 lb)  Examination: General exam: Appears calm and comfortable  Respiratory system: Clear to auscultation. Respiratory effort normal. Cardiovascular system: S1 & S2 heard, RRR. No JVD, murmurs, rubs, gallops or clicks. No pedal edema. Gastrointestinal system: Abdomen is nondistended, soft and nontender. No organomegaly or masses felt. Normal bowel sounds heard. Central nervous system: Alert and oriented. No focal neurological deficits. Extremities: Symmetric  5 x 5 power. Skin: No rashes, lesions or ulcers Psychiatry: Judgement and insight appear normal. Mood & affect appropriate.   Data Reviewed: I have personally reviewed following labs and imaging studies  CBC:  Recent Labs Lab 09/09/15 1120 09/10/15 0246  WBC 8.5 27.3*  NEUTROABS 8.2*  --   HGB 10.1* 8.5*  HCT 32.7* 27.7*  MCV 73.3* 74.3*  PLT 232 [id number]   Basic Metabolic Panel:  Recent Labs Lab 09/09/15 1120 09/09/15 1740 09/10/15 0246  NA 138  --  137  K 4.0  --  4.1  CL 107  --  110  CO2 21*  --  21*  GLUCOSE 139*  --  134*  BUN 19  --  23*  CREATININE 2.02*  --  1.98*  CALCIUM 8.5*  --  7.8*  MG  --  1.6*  --    GFR: Estimated Creatinine Clearance: 23.2 mL/min (by C-G formula based on Cr of 1.98). Liver Function Tests:  Recent Labs Lab 09/09/15 1120  AST 24  ALT 12*  ALKPHOS 72  BILITOT 0.8  PROT 5.3*  ALBUMIN 2.8*   No results for input(s): LIPASE, AMYLASE in the last 168 hours. No results for input(s): AMMONIA in the last 168 hours. Coagulation Profile:  Recent Labs Lab 09/09/15 1120  INR 1.11   Cardiac Enzymes: No results for input(s): CKTOTAL, CKMB, CKMBINDEX, TROPONINI in the last 168 hours. BNP (last 3 results) No results for input(s): PROBNP in the last 8760 hours. HbA1C: No results for input(s): HGBA1C in the last 72 hours. CBG: No results for input(s): GLUCAP in the last 168 hours. Lipid Profile: No results for input(s): CHOL, HDL, LDLCALC, TRIG, CHOLHDL, LDLDIRECT in the last 72 hours. Thyroid Function Tests: No results for input(s): TSH, T4TOTAL, FREET4, T3FREE, THYROIDAB in the last 72 hours. Anemia Panel: No results for input(s): VITAMINB12, FOLATE, FERRITIN, TIBC, IRON, RETICCTPCT in the last 72 hours. Urine analysis:    Component Value Date/Time   COLORURINE AMBER* 09/09/2015 1412   APPEARANCEUR CLOUDY* 09/09/2015 1412   LABSPEC 1.019 09/09/2015 1412   PHURINE 5.5 09/09/2015 1412   GLUCOSEU NEGATIVE 09/09/2015 1412    HGBUR LARGE* 09/09/2015 1412   BILIRUBINUR NEGATIVE 09/09/2015 1412   KETONESUR 15* 09/09/2015 1412   PROTEINUR 30* 09/09/2015 1412   UROBILINOGEN 1.0 04/24/2013 1420   NITRITE POSITIVE* 09/09/2015 1412   LEUKOCYTESUR MODERATE* 09/09/2015 1412   Sepsis Labs: @LABRCNTIP (procalcitonin:4,lacticidven:4)  ) Recent Results (from the past 240 hour(s))  Blood Culture (routine x 2)     Status: None (Preliminary result)   Collection Time: 09/09/15 11:20 AM  Result Value Ref Range Status   Specimen Description BLOOD RIGHT FOREARM  Final   Special Requests BOTTLES DRAWN AEROBIC AND ANAEROBIC  5CC  Final   Culture  Setup Time   Final    GRAM NEGATIVE RODS IN BOTH AEROBIC AND ANAEROBIC BOTTLES Organism ID to follow CRITICAL RESULT CALLED TO, READ BACK BY AND VERIFIED WITH: J MARKEL 09/10/15 @ 0935 M VESTAL    Culture GRAM NEGATIVE RODS  Final   Report Status PENDING  Incomplete  Blood Culture ID Panel (  Reflexed)     Status: Abnormal   Collection Time: 09/09/15 11:20 AM  Result Value Ref Range Status   Enterococcus species NOT DETECTED NOT DETECTED Final   Vancomycin resistance NOT DETECTED NOT DETECTED Final   Listeria monocytogenes NOT DETECTED NOT DETECTED Final   Staphylococcus species NOT DETECTED NOT DETECTED Final   Staphylococcus aureus NOT DETECTED NOT DETECTED Final   Methicillin resistance NOT DETECTED NOT DETECTED Final   Streptococcus species NOT DETECTED NOT DETECTED Final   Streptococcus agalactiae NOT DETECTED NOT DETECTED Final   Streptococcus pneumoniae NOT DETECTED NOT DETECTED Final   Streptococcus pyogenes NOT DETECTED NOT DETECTED Final   Acinetobacter baumannii NOT DETECTED NOT DETECTED Final   Enterobacteriaceae species NOT DETECTED NOT DETECTED Final   Enterobacter cloacae complex NOT DETECTED NOT DETECTED Final   Escherichia coli NOT DETECTED NOT DETECTED Final   Klebsiella oxytoca NOT DETECTED NOT DETECTED Final   Klebsiella pneumoniae NOT DETECTED NOT DETECTED  Final   Proteus species NOT DETECTED NOT DETECTED Final   Serratia marcescens NOT DETECTED NOT DETECTED Final   Carbapenem resistance NOT DETECTED NOT DETECTED Final   Haemophilus influenzae NOT DETECTED NOT DETECTED Final   Neisseria meningitidis NOT DETECTED NOT DETECTED Final   Pseudomonas aeruginosa DETECTED (A) NOT DETECTED Final    Comment: CRITICAL RESULT CALLED TO, READ BACK BY AND VERIFIED WITH: J MARKEL 09/10/15 @ 0935 M VESTAL    Candida albicans NOT DETECTED NOT DETECTED Final   Candida glabrata NOT DETECTED NOT DETECTED Final   Candida krusei NOT DETECTED NOT DETECTED Final   Candida parapsilosis NOT DETECTED NOT DETECTED Final   Candida tropicalis NOT DETECTED NOT DETECTED Final  MRSA PCR Screening     Status: None   Collection Time: 09/09/15  7:14 PM  Result Value Ref Range Status   MRSA by PCR NEGATIVE NEGATIVE Final    Comment:        The GeneXpert MRSA Assay (FDA approved for NASAL specimens only), is one component of a comprehensive MRSA colonization surveillance program. It is not intended to diagnose MRSA infection nor to guide or monitor treatment for MRSA infections.      Invalid input(s): PROCALCITONIN, [place]   Radiology Studies: Dg Chest Port 1 View  09/09/2015  CLINICAL DATA:  Hematuria for 6 days, sepsis, atrial fibrillation, dementia/Alzheimer's, chronic kidney disease EXAM: PORTABLE CHEST 1 VIEW COMPARISON:  Portable exam 1241 hours compared to 05/09/2008 FINDINGS: Minimal enlargement of cardiac silhouette. Atherosclerotic calcification and tortuosity of thoracic aorta. Mediastinal contours and pulmonary vascularity normal. Bronchitic changes with minimal bibasilar atelectasis. Lungs otherwise clear. No pleural effusion or pneumothorax. Bones demineralized. IMPRESSION: Minimal enlargement of cardiac silhouette. Bronchitic changes with minimal bibasilar atelectasis. Aortic atherosclerosis. Electronically Signed   By: Lavonia Dana M.D.   On:  09/09/2015 12:57        Scheduled Meds: . antiseptic oral rinse  7 mL Mouth Rinse BID  . levothyroxine  50 mcg Oral QAC breakfast  . pantoprazole  40 mg Oral Daily  . piperacillin-tazobactam (ZOSYN)  IV  3.375 g Intravenous Q8H  . sodium chloride flush  3 mL Intravenous Q12H   Continuous Infusions: . sodium chloride 1,000 mL (09/09/15 1957)     LOS: 1 day    Time spent: 35 minutes    Neomia Herbel A, MD [facility] Pager [phone]  If 7PM-7AM, please contact night-coverage [email or web address] Password [email or web address] 09/10/2015, 10:54 AM

## 2015-09-10 NOTE — Progress Notes (Signed)
Patient ID: Justin Chambers, male   DOB: 12-18-1923, 82 y.o.   MRN: [id number]  Subjective: I was asked to see Dr. Connye Burkitt in consultation for hematuria and sepsis.   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 Gaynelle Arabian on 7/6 for gross hematuria with terminal dysuria.    He had had a second episode of hematuria on 6/21 and had been seen in the ER for that.   He had cystoscopy in the office and the bleeding was felt to be prostatic.   He was sent home with Jalyn (dutasteride/tamsulosin) and Keflex.   He presented to the ER yesterday with persistent hematuria and sepsis and was subsequently started on ceftriaxone and aztreonam and a foley was placed.   His preliminary culture is growing GNR and his urine has cleared.   His clinical condition is improving and he is without complaints this morning.   ROS:  Review of Systems  Neurological:       He has memory loss  All other systems reviewed and are negative.   Allergies  Allergen Reactions  . Latex   . Savaysa [Edoxaban] Other (See Comments)    Unknown    Past Medical History  Diagnosis Date  . Atrial fibrillation ([place])   . Dementia   . Chronic kidney disease   . Anemia due to chronic blood loss 07/2015  . Cholecystitis   . CKD (chronic kidney disease)   . Alzheimer disease   . DOE (dyspnea on exertion)     Past Surgical History  Procedure Laterality Date  . Abdominal surgery      part of stomach removed  . Partial gastrectomy Bilateral   . Cholecystectomy N/A 04/27/2013    Procedure: LAPAROSCOPIC CHOLECYSTECTOMY WITH INTRAOPERATIVE CHOLANGIOGRAM;  Surgeon: Imogene Burn. [name], MD;  Location: [place];  Service: General;  Laterality: N/A;  . Laparoscopic lysis of adhesions N/A 04/27/2013    Procedure: LAPAROSCOPIC LYSIS OF ADHESIONS;  Surgeon: Imogene Burn. [name], MD;  Location: [place];  Service: General;  Laterality: N/A;  . Esophagogastroduodenoscopy N/A 07/30/2015    Procedure: ESOPHAGOGASTRODUODENOSCOPY (EGD);  Surgeon:  Ronald Lobo, MD;  Location: [facility] ENDOSCOPY;  Service: Endoscopy;  Laterality: N/A;  Pediatric UPPER endoscope, please    Social History   Social History  . Marital Status: Legally Separated    Spouse Name: N/A  . Number of Children: N/A  . Years of Education: N/A   Occupational History  . Not on file.   Social History Main Topics  . Smoking status: Never Smoker   . Smokeless tobacco: Never Used  . Alcohol Use: No  . Drug Use: No  . Sexual Activity: No   Other Topics Concern  . Not on file   Social History Narrative    History reviewed. No pertinent family history.  Anti-infectives: Anti-infectives    Start     Dose/Rate Route Frequency Ordered Stop   09/11/15 1230  levofloxacin (LEVAQUIN) IVPB 500 mg  Status:  Discontinued     500 mg 100 mL/hr over 60 Minutes Intravenous Every 48 hours 09/09/15 1257 09/09/15 1754   09/09/15 2000  aztreonam (AZACTAM) 500 mg in dextrose 5 % 50 mL IVPB  Status:  Discontinued     500 mg 100 mL/hr over 30 Minutes Intravenous Every 8 hours 09/09/15 1257 09/09/15 1754   09/09/15 1900  cefTRIAXone (ROCEPHIN) 1 g in dextrose 5 % 50 mL IVPB     1 g 100 mL/hr over  30 Minutes Intravenous Every 24 hours 09/09/15 1754     09/09/15 1130  aztreonam (AZACTAM) 1 g in dextrose 5 % 50 mL IVPB     1 g 100 mL/hr over 30 Minutes Intravenous  Once 09/09/15 1123 09/09/15 1229   09/09/15 1115  levofloxacin (LEVAQUIN) IVPB 750 mg     750 mg 100 mL/hr over 90 Minutes Intravenous  Once 09/09/15 1114 09/09/15 1406   09/09/15 1115  aztreonam (AZACTAM) 2 g in dextrose 5 % 50 mL IVPB  Status:  Discontinued     2 g 100 mL/hr over 30 Minutes Intravenous  Once 09/09/15 1114 09/09/15 1123      Current Facility-Administered Medications  Medication Dose Route Frequency Provider Last Rate Last Dose  . 0.9 %  sodium chloride infusion   Intravenous Continuous Geradine Girt, DO 50 mL/hr at 09/09/15 1957 1,000 mL at 09/09/15 1957  . acetaminophen (TYLENOL) tablet 650  mg  650 mg Oral Q6H PRN Geradine Girt, DO       Or  . acetaminophen (TYLENOL) suppository 650 mg  650 mg Rectal Q6H PRN Geradine Girt, DO      . antiseptic oral rinse (CPC / CETYLPYRIDINIUM CHLORIDE 0.05%) solution 7 mL  7 mL Mouth Rinse BID Geradine Girt, DO      . cefTRIAXone (ROCEPHIN) 1 g in dextrose 5 % 50 mL IVPB  1 g Intravenous Q24H Geradine Girt, DO   1 g at 09/09/15 1954  . levothyroxine (SYNTHROID, LEVOTHROID) tablet 50 mcg  50 mcg Oral QAC breakfast Geradine Girt, DO   50 mcg at 09/10/15 0831  . pantoprazole (PROTONIX) EC tablet 40 mg  40 mg Oral Daily Geradine Girt, DO   40 mg at 09/10/15 0831  . sodium chloride flush (NS) 0.9 % injection 3 mL  3 mL Intravenous Q12H Geradine Girt, DO   3 mL at 09/09/15 2152     Objective: Vital signs in last 24 hours: Temp:  [97.4 F (36.3 C)-103 F (39.4 C)] 97.4 F (36.3 C) (07/09 0700) Pulse Rate:  [63-90] 69 (07/09 0700) Resp:  [10-26] 20 (07/09 0700) BP: (85-114)/(47-67) 112/57 mmHg (07/09 0700) SpO2:  [97 %-100 %] 100 % (07/09 0700) FiO2 (%):  [2 %] 2 % (07/08 1158) Weight:  [67.132 kg (148 lb)] 67.132 kg (148 lb) (07/08 1127)  Intake/Output from previous day: 07/08 0701 - 07/09 0700 In: 505.5 [I.V.:505.5] Out: 400 [Urine:400] Intake/Output this shift:     Physical Exam  Constitutional: He is well-developed, well-nourished, and in no distress.  Cardiovascular: Normal rate and regular rhythm.   Pulmonary/Chest: Effort normal. No respiratory distress.  Genitourinary:  Urine clearing in foley bag.     Lab Results:   Recent Labs  09/09/15 1120 09/10/15 0246  WBC 8.5 27.3*  HGB 10.1* 8.5*  HCT 32.7* 27.7*  PLT 232 189   BMET  Recent Labs  09/09/15 1120 09/10/15 0246  NA 138 137  K 4.0 4.1  CL 107 110  CO2 21* 21*  GLUCOSE 139* 134*  BUN 19 23*  CREATININE 2.02* 1.98*  CALCIUM 8.5* 7.8*   PT/INR  Recent Labs  09/09/15 1120  LABPROT 14.5  INR 1.11   ABG No results for input(s): PHART, HCO3  in the last 72 hours.  Invalid input(s): PCO2, PO2  Studies/Results: Dg Chest Port 1 View  09/09/2015  CLINICAL DATA:  Hematuria for 6 days, sepsis, atrial fibrillation, dementia/Alzheimer's, chronic kidney disease EXAM: PORTABLE  CHEST 1 VIEW COMPARISON:  Portable exam 1241 hours compared to 05/09/2008 FINDINGS: Minimal enlargement of cardiac silhouette. Atherosclerotic calcification and tortuosity of thoracic aorta. Mediastinal contours and pulmonary vascularity normal. Bronchitic changes with minimal bibasilar atelectasis. Lungs otherwise clear. No pleural effusion or pneumothorax. Bones demineralized. IMPRESSION: Minimal enlargement of cardiac silhouette. Bronchitic changes with minimal bibasilar atelectasis. Aortic atherosclerosis. Electronically Signed   By: Lavonia Dana M.D.   On: 09/09/2015 12:57   Hospitalist notes and Dr. Arlyn Leak office notes reviewed.   I was unable to find a culture from our office.   Cultures and labs reviewed.   His lactic acid is improving as is his Cr.    Assessment: Gross hematuria with sepsis of a urinary origin improving with foley drainage and broad spectrum antibiotics.    Continue current care pending sensitivies.  Convert to oral agent when appropriate.   I will notify Dr. Gaynelle Arabian of his admission.    He could have a voiding trial in the morning if the urine remains clear.    He was to return to our office for a CT scan to complete the hematuria w/u, but I would hold off on that since he has no flank pain and his Cr is elevated.       CC: Dr. Eulogio Bear and Dr. Katrine Coho.     Arrielle Mcginn J 09/10/2015 [phone]

## 2015-09-10 NOTE — Progress Notes (Signed)
PHARMACY - PHYSICIAN COMMUNICATION CRITICAL VALUE ALERT - BLOOD CULTURE IDENTIFICATION (BCID)  Results for orders placed or performed during the hospital encounter of 09/09/15  Blood Culture ID Panel (Reflexed) (Collected: 09/09/2015 11:20 AM)  Result Value Ref Range   Enterococcus species NOT DETECTED NOT DETECTED   Vancomycin resistance NOT DETECTED NOT DETECTED   Listeria monocytogenes NOT DETECTED NOT DETECTED   Staphylococcus species NOT DETECTED NOT DETECTED   Staphylococcus aureus NOT DETECTED NOT DETECTED   Methicillin resistance NOT DETECTED NOT DETECTED   Streptococcus species NOT DETECTED NOT DETECTED   Streptococcus agalactiae NOT DETECTED NOT DETECTED   Streptococcus pneumoniae NOT DETECTED NOT DETECTED   Streptococcus pyogenes NOT DETECTED NOT DETECTED   Acinetobacter baumannii NOT DETECTED NOT DETECTED   Enterobacteriaceae species NOT DETECTED NOT DETECTED   Enterobacter cloacae complex NOT DETECTED NOT DETECTED   Escherichia coli NOT DETECTED NOT DETECTED   Klebsiella oxytoca NOT DETECTED NOT DETECTED   Klebsiella pneumoniae NOT DETECTED NOT DETECTED   Proteus species NOT DETECTED NOT DETECTED   Serratia marcescens NOT DETECTED NOT DETECTED   Carbapenem resistance NOT DETECTED NOT DETECTED   Haemophilus influenzae NOT DETECTED NOT DETECTED   Neisseria meningitidis NOT DETECTED NOT DETECTED   Pseudomonas aeruginosa DETECTED (A) NOT DETECTED   Candida albicans NOT DETECTED NOT DETECTED   Candida glabrata NOT DETECTED NOT DETECTED   Candida krusei NOT DETECTED NOT DETECTED   Candida parapsilosis NOT DETECTED NOT DETECTED   Candida tropicalis NOT DETECTED NOT DETECTED    Name of physician (or Provider) Contacted: Text page to Dr. Hartford Poli  Changes to prescribed antibiotics required: Currently on rocephin which will not cover pseudomonas. We will switch him to zosyn.  Deboraha Sprang 09/10/2015  10:00 AM

## 2015-09-11 ENCOUNTER — Inpatient Hospital Stay (HOSPITAL_COMMUNITY): Payer: Medicare Other

## 2015-09-11 LAB — PROTIME-INR
INR: 1.08 (ref 0.00–1.49)
Prothrombin Time: 14.2 seconds (ref 11.6–15.2)

## 2015-09-11 LAB — BASIC METABOLIC PANEL WITH GFR
Anion gap: 6 (ref 5–15)
BUN: 24 mg/dL — ABNORMAL HIGH (ref 6–20)
CO2: 23 mmol/L (ref 22–32)
Calcium: 8.4 mg/dL — ABNORMAL LOW (ref 8.9–10.3)
Chloride: 109 mmol/L (ref 101–111)
Creatinine, Ser: 2 mg/dL — ABNORMAL HIGH (ref 0.61–1.24)
GFR calc Af Amer: 32 mL/min — ABNORMAL LOW (ref >60)
GFR calc non Af Amer: 28 mL/min — ABNORMAL LOW (ref >60)
Glucose, Bld: 94 mg/dL (ref 65–99)
Potassium: 3.9 mmol/L (ref 3.5–5.1)
Sodium: 138 mmol/L (ref 135–145)

## 2015-09-11 LAB — CBC
HEMATOCRIT: 30.1 % — AB (ref 39.0–52.0)
Hemoglobin: 9.4 g/dL — ABNORMAL LOW (ref 13.0–17.0)
MCH: 22.8 pg — ABNORMAL LOW (ref 26.0–34.0)
MCHC: 31.2 g/dL (ref 30.0–36.0)
MCV: 72.9 fL — ABNORMAL LOW (ref 78.0–100.0)
PLATELETS: 210 10*3/uL (ref 150–400)
RBC: 4.13 MIL/uL — ABNORMAL LOW (ref 4.22–5.81)
RDW: 23.4 % — AB (ref 11.5–15.5)
WBC: 17.1 10*3/uL — ABNORMAL HIGH (ref 4.0–10.5)

## 2015-09-11 LAB — URINE CULTURE

## 2015-09-11 LAB — PLATELET FUNCTION ASSAY: Collagen / Epinephrine: 120 seconds (ref 0–193)

## 2015-09-11 LAB — LACTIC ACID, PLASMA
LACTIC ACID, VENOUS: 1.1 mmol/L (ref 0.5–1.9)
LACTIC ACID, VENOUS: 1 mmol/L (ref 0.5–1.9)

## 2015-09-11 LAB — APTT: aPTT: 26 s (ref 24–37)

## 2015-09-11 NOTE — Progress Notes (Signed)
Pt transferred per bed to [place] with belongings, cell phone, reading glasses, and hearing aids, and a blue cane

## 2015-09-11 NOTE — Progress Notes (Signed)
Assessment:  Pseudomonas sepsis. Repeat wbc, Cr, lactate pending. Confusion premorbid ( Alzheimer's type).  Plan: Follow with CT non-contrast abd and pelvis for upper tract evaluation, and add PT, PTT, INR, bleeding time.    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 Cysto showed severe trabecullation, cellules, diverticuli, but no stones or tumors. Rx keflex and Jalyn ( dutasteride/tamsulosin). ( note multiple drug interactions)the patient returned Sunday with Urosepsis, with elevated lactic acid, and wbc 27,000. Admitted ICU. Rx IV Zosyn and fluids .    Objective: Vital signs in last 24 hours: Temp:  [96.3 F (35.7 C)-98 F (36.7 C)] 98 F (36.7 C) (07/10 0334) Pulse Rate:  [69-85] 75 (07/10 0334) Resp:  [17-24] 17 (07/10 0334) BP: (112-141)/(57-73) 141/65 mmHg (07/10 0334) SpO2:  [98 %-100 %] 98 % (07/10 0334) Weight:  [71 kg (156 lb 8.4 oz)] 71 kg (156 lb 8.4 oz) (07/10 0500)A  Intake/Output from previous day: 07/09 0701 - 07/10 0700 In: 2483 [P.O.:1080; I.V.:1253; IV Piggyback:150] Out: 550 [Urine:550] Intake/Output this shift: Total I/O In: 793 [P.O.:240; I.V.:503; IV Piggyback:50] Out: 550 [Urine:550]  Past Medical History  Diagnosis Date  . Atrial fibrillation ([place])   . Dementia   . Chronic kidney disease   . Anemia due to chronic blood loss 07/2015  . Cholecystitis   . CKD (chronic kidney disease)   . Alzheimer disease   . DOE (dyspnea on exertion)     Physical Exam:  Lungs - Normal respiratory effort, chest expands symmetrically.  Abdomen - Soft, non-tender & non-distended.  Lab Results:  Recent Labs  09/09/15 1120 09/10/15 0246  WBC 8.5 27.3*  HGB 10.1* 8.5*  HCT 32.7* 27.7*   BMET  Recent Labs  09/09/15 1120 09/10/15 0246  NA 138 137  K 4.0 4.1  CL 107 110  CO2 21* 21*  GLUCOSE 139* 134*  BUN 19 23*  CREATININE 2.02* 1.98*  CALCIUM 8.5* 7.8*   No results for input(s): LABURIN in the last  72 hours. Results for orders placed or performed during the hospital encounter of 09/09/15  Blood Culture (routine x 2)     Status: None (Preliminary result)   Collection Time: 09/09/15 11:20 AM  Result Value Ref Range Status   Specimen Description BLOOD RIGHT FOREARM  Final   Special Requests BOTTLES DRAWN AEROBIC AND ANAEROBIC  5CC  Final   Culture  Setup Time   Final    GRAM NEGATIVE RODS IN BOTH AEROBIC AND ANAEROBIC BOTTLES Organism ID to follow CRITICAL RESULT CALLED TO, READ BACK BY AND VERIFIED WITH: J [facility] 09/10/15 @ 0935 M VESTAL    Culture GRAM NEGATIVE RODS  Final   Report Status PENDING  Incomplete  Blood Culture ID Panel (Reflexed)     Status: Abnormal   Collection Time: 09/09/15 11:20 AM  Result Value Ref Range Status   Enterococcus species NOT DETECTED NOT DETECTED Final   Vancomycin resistance NOT DETECTED NOT DETECTED Final   Listeria monocytogenes NOT DETECTED NOT DETECTED Final   Staphylococcus species NOT DETECTED NOT DETECTED Final   Staphylococcus aureus NOT DETECTED NOT DETECTED Final   Methicillin resistance NOT DETECTED NOT DETECTED Final   Streptococcus species NOT DETECTED NOT DETECTED Final   Streptococcus agalactiae NOT DETECTED NOT DETECTED Final   Streptococcus pneumoniae NOT DETECTED NOT DETECTED Final   Streptococcus pyogenes NOT DETECTED NOT DETECTED Final   Acinetobacter baumannii NOT DETECTED NOT DETECTED Final   Enterobacteriaceae species NOT DETECTED NOT DETECTED  Final   Enterobacter cloacae complex NOT DETECTED NOT DETECTED Final   Escherichia coli NOT DETECTED NOT DETECTED Final   Klebsiella oxytoca NOT DETECTED NOT DETECTED Final   Klebsiella pneumoniae NOT DETECTED NOT DETECTED Final   Proteus species NOT DETECTED NOT DETECTED Final   Serratia marcescens NOT DETECTED NOT DETECTED Final   Carbapenem resistance NOT DETECTED NOT DETECTED Final   Haemophilus influenzae NOT DETECTED NOT DETECTED Final   Neisseria meningitidis NOT DETECTED  NOT DETECTED Final   Pseudomonas aeruginosa DETECTED (A) NOT DETECTED Final    Comment: CRITICAL RESULT CALLED TO, READ BACK BY AND VERIFIED WITH: J MARKEL 09/10/15 @ 0935 M VESTAL    Candida albicans NOT DETECTED NOT DETECTED Final   Candida glabrata NOT DETECTED NOT DETECTED Final   Candida krusei NOT DETECTED NOT DETECTED Final   Candida parapsilosis NOT DETECTED NOT DETECTED Final   Candida tropicalis NOT DETECTED NOT DETECTED Final  Blood Culture (routine x 2)     Status: None (Preliminary result)   Collection Time: 09/09/15 11:41 AM  Result Value Ref Range Status   Specimen Description BLOOD RIGHT HAND  Final   Special Requests BOTTLES DRAWN AEROBIC ONLY  3CC  Final   Culture  Setup Time   Final    GRAM NEGATIVE RODS AEROBIC BOTTLE ONLY CRITICAL RESULT CALLED TO, READ BACK BY AND VERIFIED WITH: Scherrie Gerlach 09/10/15 @ 54 M VESTAL    Culture GRAM NEGATIVE RODS  Final   Report Status PENDING  Incomplete  Urine culture     Status: Abnormal (Preliminary result)   Collection Time: 09/09/15  2:12 PM  Result Value Ref Range Status   Specimen Description URINE, CATHETERIZED  Final   Special Requests NONE  Final   Culture >=100,000 COLONIES/mL PSEUDOMONAS AERUGINOSA (A)  Final   Report Status PENDING  Incomplete  MRSA PCR Screening     Status: None   Collection Time: 09/09/15  7:14 PM  Result Value Ref Range Status   MRSA by PCR NEGATIVE NEGATIVE Final    Comment:        The GeneXpert MRSA Assay (FDA approved for NASAL specimens only), is one component of a comprehensive MRSA colonization surveillance program. It is not intended to diagnose MRSA infection nor to guide or monitor treatment for MRSA infections.     Studies/Results: No results found.    Helina Hullum I Rukaya Kleinschmidt 09/11/2015, 6:22 AM

## 2015-09-11 NOTE — Care Management Important Message (Signed)
Important Message  Patient Details  Name: Justin Chambers MRN: [id number] Date of Birth: [birth year]   Medicare Important Message Given:  Yes    [name] 09/11/2015, 8:06 AM

## 2015-09-11 NOTE — Care Management Note (Signed)
Case Management Note  Patient Details  Name: Justin Chambers MRN: [id number] Date of Birth: [birth year]  Subjective/Objective:   Pt lives alone @ [place] in an independent apartment, has aide services 4 hrs/day, 2 days/wk through [facility], has been receiving physical therapy through [facility].  CM met with dtr, Sherlon Handing [phone]), who states she will return home to [facility] tomorrow but has all the contact information and wants to be the point person for discharge planning even though another daughter will arrive tomorrow.  She states that pt was a resident of SNF for rehab previously and did not adjust well, family prefers that pt return to his apartment with home health and CNA services.  [name] has already spoken with [facility] and alerted them to the probable need for additional hours when pt is discharged.                            Expected Discharge Plan:  [place]  Discharge planning Services  CM Consult  Status of Service:  In process, will continue to follow  Girard Cooter, RN 09/11/2015, 2:18 PM

## 2015-09-11 NOTE — Progress Notes (Signed)
Called report to [place] spoke to [facility].  Family aware of pending  transfer

## 2015-09-11 NOTE — Progress Notes (Signed)
Patient was awake all night despite x1 dose of sleep aid. Patient noted with confusion during the night consistent with his diagnosis of Dementia.

## 2015-09-11 NOTE — Progress Notes (Signed)
PROGRESS NOTE  Justin Chambers  [id number] DOB: 24-Jul-1923 DOA: 09/09/2015 PCP: Geoffery Lyons, MD Outpatient Specialists:  Subjective: Seen with his daughter at bedside, was sleeping without any complaints.  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 Presents to the ER with blood in urine that he describes as bright red, "like blood from your vein". He was see in Dr. Arlyn Leak office on 7/06 and has cystoscopy for hematuria. The hematuria worsened after scope. Patient states no cancer nor stones were found but Dr. Gaynelle Arabian planned to do a CT scan on Monday. He was also given Keflex which he has been taking without reaction. Patient was found to have fever by EMS and blood dripping down leg. Denies fever but does admit to dysuria  ED Course:  Hgb was found to be stable as well as Cr. On labs ER PA spoke with Dr. Jeffie Pollock of urology that said cystoscopy showed prostate as the probable source of bleeding as it was enlarged and friable. He was also started on Jalyn. hospitalist were asked to admit with a consult by urology  Assessment & Plan:   Active Problems:   Chronic kidney disease (CKD) stage G3a/A3, moderately decreased glomerular filtration rate (GFR) between 45-59 mL/min/1.73 square meter and albuminuria creatinine ratio greater than 300 mg/g   Atrial fibrillation (HCC)   Alzheimer disease   Hematuria   Hypotension   Severe sepsis -Patient presented with fever of 103, WBC of 27.3 and hypertension in the presence of UTI/bacteremia. -The lactic acid is 3.86 indicating end organ damage. -Hypotension is nicely responded to IV fluid boluses. -In the IV fluids and IV antibiotics.  Pseudomonas aeruginosa bacteremia -Blood culture and be BCID showed pseudomonas aeruginosa, likely secondary to UTI -Initially patient was on Rocephin, this was switched to Zosyn, await final  sensitivities.  Hematuria with penile bleeding (appears to be prostate) -This is likely secondary to recent instrumentation, BPH and pseudomonal UTI in the settings of anticoagulation with [facility]. -Hematuria resolved, will likely place back on anticoagulation with okay with urology.  Pseudomonas UTI -Urinalysis grew Pseudomonas, currently on Zosyn -Susceptible to Cipro, likely can start oral Cipro on discharge.  A fib -Rate is controlled, continue home medications. -CHA2DS2-VASc is probably 2 for age, on 94 -Hold anticoagulation because of concurrent hematuria.  Dementia -Has very mild dementia, appears at baseline  Hypotension -Blood pressure was 95/53 on admission, given IV fluids. -His BP is over 100 this morning.  CKD- III -At baseline, creatinine baseline 1.9.   DVT prophylaxis:  Code Status: Full Code Family Communication:  Disposition Plan:  Diet: Diet Heart Room service appropriate?: Yes; Fluid consistency:: Thin  Consultants:   Urology  Procedures:   None  Antimicrobials:   Zosyn   Objective: Filed Vitals:   09/10/15 2359 09/11/15 0334 09/11/15 0500 09/11/15 0800  BP: 125/73 141/65  124/73  Pulse: 85 75  79  Temp: 97.9 F (36.6 C) 98 F (36.7 C)  97.4 F (36.3 C)  TempSrc: Oral Oral  Oral  Resp: 21 17  24   Height:      Weight:   71 kg (156 lb 8.4 oz)   SpO2:  98%  100%    Intake/Output Summary (Last 24 hours) at 09/11/15 1133 Last data filed at 09/11/15 0951  Gross per 24 hour  Intake   1993 ml  Output    950 ml  Net   1043 ml  Filed Weights   09/09/15 1127 09/11/15 0500  Weight: 67.132 kg (148 lb) 71 kg (156 lb 8.4 oz)    Examination: General exam: Appears calm and comfortable  Respiratory system: Clear to auscultation. Respiratory effort normal. Cardiovascular system: S1 & S2 heard, RRR. No JVD, murmurs, rubs, gallops or clicks. No pedal edema. Gastrointestinal system: Abdomen is nondistended, soft and nontender. No  organomegaly or masses felt. Normal bowel sounds heard. Central nervous system: Alert and oriented. No focal neurological deficits. Extremities: Symmetric 5 x 5 power. Skin: No rashes, lesions or ulcers Psychiatry: Judgement and insight appear normal. Mood & affect appropriate.   Data Reviewed: I have personally reviewed following labs and imaging studies  CBC:  Recent Labs Lab 09/09/15 1120 09/10/15 0246 09/11/15 0648  WBC 8.5 27.3* 17.1*  NEUTROABS 8.2*  --   --   HGB 10.1* 8.5* 9.4*  HCT 32.7* 27.7* 30.1*  MCV 73.3* 74.3* 72.9*  PLT 232 189 [id number]   Basic Metabolic Panel:  Recent Labs Lab 09/09/15 1120 09/09/15 1740 09/10/15 0246 09/11/15 0648  NA 138  --  137 138  K 4.0  --  4.1 3.9  CL 107  --  110 109  CO2 21*  --  21* 23  GLUCOSE 139*  --  134* 94  BUN 19  --  23* 24*  CREATININE 2.02*  --  1.98* 2.00*  CALCIUM 8.5*  --  7.8* 8.4*  MG  --  1.6*  --   --    GFR: Estimated Creatinine Clearance: 23 mL/min (by C-G formula based on Cr of 2). Liver Function Tests:  Recent Labs Lab 09/09/15 1120  AST 24  ALT 12*  ALKPHOS 72  BILITOT 0.8  PROT 5.3*  ALBUMIN 2.8*   No results for input(s): LIPASE, AMYLASE in the last 168 hours. No results for input(s): AMMONIA in the last 168 hours. Coagulation Profile:  Recent Labs Lab 09/09/15 1120 09/11/15 0648  INR 1.11 1.08   Cardiac Enzymes: No results for input(s): CKTOTAL, CKMB, CKMBINDEX, TROPONINI in the last 168 hours. BNP (last 3 results) No results for input(s): PROBNP in the last 8760 hours. HbA1C: No results for input(s): HGBA1C in the last 72 hours. CBG: No results for input(s): GLUCAP in the last 168 hours. Lipid Profile: No results for input(s): CHOL, HDL, LDLCALC, TRIG, CHOLHDL, LDLDIRECT in the last 72 hours. Thyroid Function Tests: No results for input(s): TSH, T4TOTAL, FREET4, T3FREE, THYROIDAB in the last 72 hours. Anemia Panel: No results for input(s): VITAMINB12, FOLATE, FERRITIN, TIBC,  IRON, RETICCTPCT in the last 72 hours. Urine analysis:    Component Value Date/Time   COLORURINE AMBER* 09/09/2015 1412   APPEARANCEUR CLOUDY* 09/09/2015 1412   LABSPEC 1.019 09/09/2015 1412   PHURINE 5.5 09/09/2015 1412   GLUCOSEU NEGATIVE 09/09/2015 1412   HGBUR LARGE* 09/09/2015 1412   BILIRUBINUR NEGATIVE 09/09/2015 1412   KETONESUR 15* 09/09/2015 1412   PROTEINUR 30* 09/09/2015 1412   UROBILINOGEN 1.0 04/24/2013 1420   NITRITE POSITIVE* 09/09/2015 1412   LEUKOCYTESUR MODERATE* 09/09/2015 1412   Recent Results (from the past 240 hour(s))  Blood Culture (routine x 2)     Status: Abnormal (Preliminary result)   Collection Time: 09/09/15 11:20 AM  Result Value Ref Range Status   Specimen Description BLOOD RIGHT FOREARM  Final   Special Requests BOTTLES DRAWN AEROBIC AND ANAEROBIC  5CC  Final   Culture  Setup Time   Final    GRAM NEGATIVE RODS IN BOTH AEROBIC AND  ANAEROBIC BOTTLES Organism ID to follow CRITICAL RESULT CALLED TO, READ BACK BY AND VERIFIED WITH: J MARKEL 09/10/15 @ 0935 M VESTAL    Culture (A)  Final    PSEUDOMONAS AERUGINOSA SUSCEPTIBILITIES TO FOLLOW    Report Status PENDING  Incomplete  Blood Culture ID Panel (Reflexed)     Status: Abnormal   Collection Time: 09/09/15 11:20 AM  Result Value Ref Range Status   Enterococcus species NOT DETECTED NOT DETECTED Final   Vancomycin resistance NOT DETECTED NOT DETECTED Final   Listeria monocytogenes NOT DETECTED NOT DETECTED Final   Staphylococcus species NOT DETECTED NOT DETECTED Final   Staphylococcus aureus NOT DETECTED NOT DETECTED Final   Methicillin resistance NOT DETECTED NOT DETECTED Final   Streptococcus species NOT DETECTED NOT DETECTED Final   Streptococcus agalactiae NOT DETECTED NOT DETECTED Final   Streptococcus pneumoniae NOT DETECTED NOT DETECTED Final   Streptococcus pyogenes NOT DETECTED NOT DETECTED Final   Acinetobacter baumannii NOT DETECTED NOT DETECTED Final   Enterobacteriaceae species  NOT DETECTED NOT DETECTED Final   Enterobacter cloacae complex NOT DETECTED NOT DETECTED Final   Escherichia coli NOT DETECTED NOT DETECTED Final   Klebsiella oxytoca NOT DETECTED NOT DETECTED Final   Klebsiella pneumoniae NOT DETECTED NOT DETECTED Final   Proteus species NOT DETECTED NOT DETECTED Final   Serratia marcescens NOT DETECTED NOT DETECTED Final   Carbapenem resistance NOT DETECTED NOT DETECTED Final   Haemophilus influenzae NOT DETECTED NOT DETECTED Final   Neisseria meningitidis NOT DETECTED NOT DETECTED Final   Pseudomonas aeruginosa DETECTED (A) NOT DETECTED Final    Comment: CRITICAL RESULT CALLED TO, READ BACK BY AND VERIFIED WITH: J MARKEL 09/10/15 @ 0935 M VESTAL    Candida albicans NOT DETECTED NOT DETECTED Final   Candida glabrata NOT DETECTED NOT DETECTED Final   Candida krusei NOT DETECTED NOT DETECTED Final   Candida parapsilosis NOT DETECTED NOT DETECTED Final   Candida tropicalis NOT DETECTED NOT DETECTED Final  Blood Culture (routine x 2)     Status: None (Preliminary result)   Collection Time: 09/09/15 11:41 AM  Result Value Ref Range Status   Specimen Description BLOOD RIGHT HAND  Final   Special Requests BOTTLES DRAWN AEROBIC ONLY  3CC  Final   Culture  Setup Time   Final    GRAM NEGATIVE RODS AEROBIC BOTTLE ONLY CRITICAL RESULT CALLED TO, READ BACK BY AND VERIFIED WITH: J MARKEL 09/10/15 @ 1448 M VESTAL    Culture GRAM NEGATIVE RODS  Final   Report Status PENDING  Incomplete  Urine culture     Status: Abnormal   Collection Time: 09/09/15  2:12 PM  Result Value Ref Range Status   Specimen Description URINE, CATHETERIZED  Final   Special Requests NONE  Final   Culture >=100,000 COLONIES/mL PSEUDOMONAS AERUGINOSA (A)  Final   Report Status 09/11/2015 FINAL  Final   Organism ID, Bacteria PSEUDOMONAS AERUGINOSA (A)  Final      Susceptibility   Pseudomonas aeruginosa - MIC*    CEFTAZIDIME 4 SENSITIVE Sensitive     CIPROFLOXACIN <=0.25 SENSITIVE Sensitive      GENTAMICIN <=1 SENSITIVE Sensitive     IMIPENEM 2 SENSITIVE Sensitive     PIP/TAZO <=4 SENSITIVE Sensitive     CEFEPIME 2 SENSITIVE Sensitive     * >=100,000 COLONIES/mL PSEUDOMONAS AERUGINOSA  MRSA PCR Screening     Status: None   Collection Time: 09/09/15  7:14 PM  Result Value Ref Range Status   MRSA by  PCR NEGATIVE NEGATIVE Final    Comment:        The GeneXpert MRSA Assay (FDA approved for NASAL specimens only), is one component of a comprehensive MRSA colonization surveillance program. It is not intended to diagnose MRSA infection nor to guide or monitor treatment for MRSA infections.      Invalid input(s): PROCALCITONIN, LACTICACIDVEN   Radiology Studies: Ct Abdomen Pelvis Wo Contrast  09/11/2015  CLINICAL DATA:  Pseudomonas sepsis with recurrent gross hematuria. Recent cysto showed severe trabeculation and bladder diverticuli but no stones or tumors. EXAM: CT ABDOMEN AND PELVIS WITHOUT CONTRAST TECHNIQUE: Multidetector CT imaging of the abdomen and pelvis was performed following the standard protocol without IV contrast. COMPARISON:  04/24/2013 FINDINGS: Lower chest: Small to moderate bilateral pleural effusions with dependent lower lobe atelectasis. Hepatobiliary: No focal abnormality in the liver on this study without intravenous contrast. No evidence of hepatomegaly. Gallbladder surgically absent. No intrahepatic or extrahepatic biliary dilation. Pancreas: Pancreatic parenchyma is diffusely atrophic. Spleen: No splenomegaly. No focal mass lesion. Adrenals/Urinary Tract: No adrenal nodule or mass. 2.2 cm interpolar right renal lesion measures water density and was about 2 cm on the previous study. Although not fully characterized given lack of intravenous contrast, this is compatible with a cyst. A pair of small probable cysts in the upper pole of the left kidney are also not substantially changed measuring about 11 mm each. No evidence for renal stones. No hydronephrosis.  No ureteral stones or hydroureter. Bladder decompressed by Foley catheter. Gas in the bladder lumen may be related to recent cystoscopy or presence of a Foley catheter. Stomach/Bowel: Stomach is nondistended. No gastric wall thickening. Patient is status post distal gastrectomy Duodenum is normally positioned as is the ligament of Treitz. No small bowel wall thickening. No small bowel dilatation. The terminal ileum is normal. The appendix is not visualized, but there is no edema or inflammation in the region of the cecum. Diverticular changes are noted in the left colon without evidence of diverticulitis. Vascular/Lymphatic: There is abdominal aortic atherosclerosis without aneurysm. There is no gastrohepatic or hepatoduodenal ligament lymphadenopathy. No intraperitoneal or retroperitoneal lymphadenopathy. No pelvic sidewall lymphadenopathy. Reproductive: Prostate gland is markedly enlarged. Other: No intraperitoneal free fluid. Musculoskeletal: Left inguinal hernia contains a loop of sigmoid colon without complicating features. Diffuse body wall edema is evident. Bone windows reveal no worrisome lytic or sclerotic osseous lesions. IMPRESSION: 1. No specific findings by CT to explain the patient's history of hematuria. Specifically, no urinary obstruction or urinary stones. Marked prostatomegaly is evident. 2. Small to moderate bilateral pleural effusions with dependent atelectasis in the lower lobes. 3. Small bilateral renal cysts. 4. Status post distal gastrectomy. 5. Left inguinal hernia contains a loop of sigmoid colon without complicating features. Imaging findings of potential clinical significance: Abdominal Aortic Atherosclerois (ICD10-170.0) Electronically Signed   By: Misty Stanley M.D.   On: 09/11/2015 10:49   Dg Chest Port 1 View  09/09/2015  CLINICAL DATA:  Hematuria for 6 days, sepsis, atrial fibrillation, dementia/Alzheimer's, chronic kidney disease EXAM: PORTABLE CHEST 1 VIEW COMPARISON:  Portable  exam 1241 hours compared to 05/09/2008 FINDINGS: Minimal enlargement of cardiac silhouette. Atherosclerotic calcification and tortuosity of thoracic aorta. Mediastinal contours and pulmonary vascularity normal. Bronchitic changes with minimal bibasilar atelectasis. Lungs otherwise clear. No pleural effusion or pneumothorax. Bones demineralized. IMPRESSION: Minimal enlargement of cardiac silhouette. Bronchitic changes with minimal bibasilar atelectasis. Aortic atherosclerosis. Electronically Signed   By: Lavonia Dana M.D.   On: 09/09/2015 12:57  Scheduled Meds: . antiseptic oral rinse  7 mL Mouth Rinse BID  . levothyroxine  50 mcg Oral QAC breakfast  . pantoprazole  40 mg Oral Daily  . piperacillin-tazobactam (ZOSYN)  IV  3.375 g Intravenous Q8H  . sodium chloride flush  3 mL Intravenous Q12H   Continuous Infusions: . sodium chloride 50 mL/hr at 09/10/15 0800     LOS: 2 days    Time spent: 35 minutes    Amazin Pincock A, MD [facility] Pager [phone]  If 7PM-7AM, please contact night-coverage [email or web address] Password [facility] 09/11/2015, 11:33 AM

## 2015-09-11 NOTE — Progress Notes (Signed)
Patient arrived on unit via bed from [facility].  No family at bedside.  Telemetry placed per MD.

## 2015-09-12 LAB — CULTURE, BLOOD (ROUTINE X 2)

## 2015-09-12 MED ORDER — CIPROFLOXACIN HCL 500 MG PO TABS
500.0000 mg | ORAL_TABLET | Freq: Two times a day (BID) | ORAL | Status: DC
  Administered 2015-09-12 – 2015-09-13 (×2): 500 mg via ORAL
  Filled 2015-09-12 (×2): qty 1

## 2015-09-12 NOTE — Progress Notes (Signed)
PROGRESS NOTE  Justin Chambers  [id number] DOB: 1923-03-17 DOA: 09/09/2015 PCP: Geoffery Lyons, MD Outpatient Specialists:  Subjective: Still complaining about generalized weakness, urine cleared up. We'll discontinue Foley catheter and switch antibiotics to oral Cipro.  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 Presents to the ER with blood in urine that he describes as bright red, "like blood from your vein". He was see in Dr. Arlyn Leak office on 7/06 and has cystoscopy for hematuria. The hematuria worsened after scope. Patient states no cancer nor stones were found but Dr. Gaynelle Arabian planned to do a CT scan on Monday. He was also given Keflex which he has been taking without reaction. Patient was found to have fever by EMS and blood dripping down leg. Denies fever but does admit to dysuria  ED Course:  Hgb was found to be stable as well as Cr. On labs ER PA spoke with Dr. Jeffie Pollock of urology that said cystoscopy showed prostate as the probable source of bleeding as it was enlarged and friable. He was also started on Jalyn. hospitalist were asked to admit with a consult by urology  Assessment & Plan:   Active Problems:   Chronic kidney disease (CKD) stage G3a/A3, moderately decreased glomerular filtration rate (GFR) between 45-59 mL/min/1.73 square meter and albuminuria creatinine ratio greater than 300 mg/g   Atrial fibrillation (HCC)   Alzheimer disease   Hematuria   Hypotension   Severe sepsis -Patient presented with fever of 103, WBC of 27.3 and hypertension in the presence of UTI/bacteremia. -The lactic acid is 3.86 indicating end organ damage. -Hypotension is nicely responded to IV fluid boluses. -Discontinued IV fluids, sepsis physiology resolved.  Pseudomonas aeruginosa bacteremia -Blood culture and be BCID showed pseudomonas aeruginosa, likely secondary to  UTI -Initially patient was on Rocephin, this was switched to Zosyn, will switch to oral Cipro.  Gross hematuria -This is likely secondary to recent instrumentation, BPH and pseudomonal UTI in the settings of anticoagulation with Savaysa. -Hematuria resolved, will likely place back on anticoagulation with okay with urology.   Pseudomonas UTI -Urinalysis grew Pseudomonas, currently on Zosyn -Susceptible to Cipro, likely can start oral Cipro on discharge.  BPH -Follow-up with Dr. Gaynelle Arabian as outpatient, continue Foley catheter on discharge.  A fib -Rate is controlled, continue home medications. -CHA2DS2-VASc is probably 2 for age, on 30 -Hold anticoagulation because of concurrent hematuria. Restart Savaysa on discharge.  Dementia -Has very mild dementia, appears at baseline  Hypotension -Blood pressure was 95/53 on admission, given IV fluids. -His BP is over 100 this morning.  CKD- III -At baseline, creatinine baseline 1.9.   DVT prophylaxis: SCDs secondary to hematuria Code Status: Full Code Family Communication: Discussed with daughter at bedside on 7/10 Disposition Plan: Back in the morning to [facility] independent living facility, with home health services. Diet: Diet Heart Room service appropriate?: Yes; Fluid consistency:: Thin  Consultants:   Urology  Procedures:   None  Antimicrobials:   Zosyn which to Cipro  Objective: Filed Vitals:   09/11/15 1804 09/11/15 2121 09/12/15 0522 09/12/15 0812  BP: 146/66 139/59 153/90 166/70  Pulse: 70 65 71 68  Temp: 97.8 F (36.6 C) 98.1 F (36.7 C) 98.4 F (36.9 C) 97.9 F (36.6 C)  TempSrc: Oral Oral Oral Oral  Resp: 20 18 17 17   Height:      Weight:  73 kg (160 lb 15 oz)  SpO2: 100% 100% 99% 97%    Intake/Output Summary (Last 24 hours) at 09/12/15 1632 Last data filed at 09/12/15 1452  Gross per 24 hour  Intake    390 ml  Output   1800 ml  Net  -1410 ml   Filed Weights   09/09/15 1127 09/11/15  0500 09/11/15 2121  Weight: 67.132 kg (148 lb) 71 kg (156 lb 8.4 oz) 73 kg (160 lb 15 oz)    Examination: General exam: Appears calm and comfortable  Respiratory system: Clear to auscultation. Respiratory effort normal. Cardiovascular system: S1 & S2 heard, RRR. No JVD, murmurs, rubs, gallops or clicks. No pedal edema. Gastrointestinal system: Abdomen is nondistended, soft and nontender. No organomegaly or masses felt. Normal bowel sounds heard. Central nervous system: Alert and oriented. No focal neurological deficits. Extremities: Symmetric 5 x 5 power. Skin: No rashes, lesions or ulcers Psychiatry: Judgement and insight appear normal. Mood & affect appropriate.   Data Reviewed: I have personally reviewed following labs and imaging studies  CBC:  Recent Labs Lab 09/09/15 1120 09/10/15 0246 09/11/15 0648  WBC 8.5 27.3* 17.1*  NEUTROABS 8.2*  --   --   HGB 10.1* 8.5* 9.4*  HCT 32.7* 27.7* 30.1*  MCV 73.3* 74.3* 72.9*  PLT 232 189 [id number]   Basic Metabolic Panel:  Recent Labs Lab 09/09/15 1120 09/09/15 1740 09/10/15 0246 09/11/15 0648  NA 138  --  137 138  K 4.0  --  4.1 3.9  CL 107  --  110 109  CO2 21*  --  21* 23  GLUCOSE 139*  --  134* 94  BUN 19  --  23* 24*  CREATININE 2.02*  --  1.98* 2.00*  CALCIUM 8.5*  --  7.8* 8.4*  MG  --  1.6*  --   --    GFR: Estimated Creatinine Clearance: 23 mL/min (by C-G formula based on Cr of 2). Liver Function Tests:  Recent Labs Lab 09/09/15 1120  AST 24  ALT 12*  ALKPHOS 72  BILITOT 0.8  PROT 5.3*  ALBUMIN 2.8*   No results for input(s): LIPASE, AMYLASE in the last 168 hours. No results for input(s): AMMONIA in the last 168 hours. Coagulation Profile:  Recent Labs Lab 09/09/15 1120 09/11/15 0648  INR 1.11 1.08   Cardiac Enzymes: No results for input(s): CKTOTAL, CKMB, CKMBINDEX, TROPONINI in the last 168 hours. BNP (last 3 results) No results for input(s): PROBNP in the last 8760 hours. HbA1C: No results  for input(s): HGBA1C in the last 72 hours. CBG: No results for input(s): GLUCAP in the last 168 hours. Lipid Profile: No results for input(s): CHOL, HDL, LDLCALC, TRIG, CHOLHDL, LDLDIRECT in the last 72 hours. Thyroid Function Tests: No results for input(s): TSH, T4TOTAL, FREET4, T3FREE, THYROIDAB in the last 72 hours. Anemia Panel: No results for input(s): VITAMINB12, FOLATE, FERRITIN, TIBC, IRON, RETICCTPCT in the last 72 hours. Urine analysis:    Component Value Date/Time   COLORURINE AMBER* 09/09/2015 1412   APPEARANCEUR CLOUDY* 09/09/2015 1412   LABSPEC 1.019 09/09/2015 1412   PHURINE 5.5 09/09/2015 1412   GLUCOSEU NEGATIVE 09/09/2015 1412   HGBUR LARGE* 09/09/2015 1412   BILIRUBINUR NEGATIVE 09/09/2015 1412   KETONESUR 15* 09/09/2015 1412   PROTEINUR 30* 09/09/2015 1412   UROBILINOGEN 1.0 04/24/2013 1420   NITRITE POSITIVE* 09/09/2015 1412   LEUKOCYTESUR MODERATE* 09/09/2015 1412   Recent Results (from the past 240 hour(s))  Blood Culture (routine x 2)     Status: Abnormal  Collection Time: 09/09/15 11:20 AM  Result Value Ref Range Status   Specimen Description BLOOD RIGHT FOREARM  Final   Special Requests BOTTLES DRAWN AEROBIC AND ANAEROBIC  5CC  Final   Culture  Setup Time   Final    GRAM NEGATIVE RODS IN BOTH AEROBIC AND ANAEROBIC BOTTLES CRITICAL RESULT CALLED TO, READ BACK BY AND VERIFIED WITH: J [facility] 09/10/15 @ 52 M VESTAL    Culture PSEUDOMONAS AERUGINOSA (A)  Final   Report Status 09/12/2015 FINAL  Final   Organism ID, Bacteria PSEUDOMONAS AERUGINOSA  Final      Susceptibility   Pseudomonas aeruginosa - MIC*    CEFTAZIDIME 4 SENSITIVE Sensitive     CIPROFLOXACIN <=0.25 SENSITIVE Sensitive     GENTAMICIN <=1 SENSITIVE Sensitive     IMIPENEM 2 SENSITIVE Sensitive     PIP/TAZO 8 SENSITIVE Sensitive     CEFEPIME 2 SENSITIVE Sensitive     * PSEUDOMONAS AERUGINOSA  Blood Culture ID Panel (Reflexed)     Status: Abnormal   Collection Time: 09/09/15 11:20 AM    Result Value Ref Range Status   Enterococcus species NOT DETECTED NOT DETECTED Final   Vancomycin resistance NOT DETECTED NOT DETECTED Final   Listeria monocytogenes NOT DETECTED NOT DETECTED Final   Staphylococcus species NOT DETECTED NOT DETECTED Final   Staphylococcus aureus NOT DETECTED NOT DETECTED Final   Methicillin resistance NOT DETECTED NOT DETECTED Final   Streptococcus species NOT DETECTED NOT DETECTED Final   Streptococcus agalactiae NOT DETECTED NOT DETECTED Final   Streptococcus pneumoniae NOT DETECTED NOT DETECTED Final   Streptococcus pyogenes NOT DETECTED NOT DETECTED Final   Acinetobacter baumannii NOT DETECTED NOT DETECTED Final   Enterobacteriaceae species NOT DETECTED NOT DETECTED Final   Enterobacter cloacae complex NOT DETECTED NOT DETECTED Final   Escherichia coli NOT DETECTED NOT DETECTED Final   Klebsiella oxytoca NOT DETECTED NOT DETECTED Final   Klebsiella pneumoniae NOT DETECTED NOT DETECTED Final   Proteus species NOT DETECTED NOT DETECTED Final   Serratia marcescens NOT DETECTED NOT DETECTED Final   Carbapenem resistance NOT DETECTED NOT DETECTED Final   Haemophilus influenzae NOT DETECTED NOT DETECTED Final   Neisseria meningitidis NOT DETECTED NOT DETECTED Final   Pseudomonas aeruginosa DETECTED (A) NOT DETECTED Final    Comment: CRITICAL RESULT CALLED TO, READ BACK BY AND VERIFIED WITH: J MARKEL 09/10/15 @ 0935 M VESTAL    Candida albicans NOT DETECTED NOT DETECTED Final   Candida glabrata NOT DETECTED NOT DETECTED Final   Candida krusei NOT DETECTED NOT DETECTED Final   Candida parapsilosis NOT DETECTED NOT DETECTED Final   Candida tropicalis NOT DETECTED NOT DETECTED Final  Blood Culture (routine x 2)     Status: Abnormal   Collection Time: 09/09/15 11:41 AM  Result Value Ref Range Status   Specimen Description BLOOD RIGHT HAND  Final   Special Requests BOTTLES DRAWN AEROBIC ONLY  3CC  Final   Culture  Setup Time   Final    GRAM NEGATIVE  RODS AEROBIC BOTTLE ONLY CRITICAL RESULT CALLED TO, READ BACK BY AND VERIFIED WITH: J MARKEL 09/10/15 @ 1448 M VESTAL    Culture (A)  Final    PSEUDOMONAS AERUGINOSA SUSCEPTIBILITIES PERFORMED ON PREVIOUS CULTURE WITHIN THE LAST 5 DAYS.    Report Status 09/12/2015 FINAL  Final  Urine culture     Status: Abnormal   Collection Time: 09/09/15  2:12 PM  Result Value Ref Range Status   Specimen Description URINE, CATHETERIZED  Final  Special Requests NONE  Final   Culture >=100,000 COLONIES/mL PSEUDOMONAS AERUGINOSA (A)  Final   Report Status 09/11/2015 FINAL  Final   Organism ID, Bacteria PSEUDOMONAS AERUGINOSA (A)  Final      Susceptibility   Pseudomonas aeruginosa - MIC*    CEFTAZIDIME 4 SENSITIVE Sensitive     CIPROFLOXACIN <=0.25 SENSITIVE Sensitive     GENTAMICIN <=1 SENSITIVE Sensitive     IMIPENEM 2 SENSITIVE Sensitive     PIP/TAZO <=4 SENSITIVE Sensitive     CEFEPIME 2 SENSITIVE Sensitive     * >=100,000 COLONIES/mL PSEUDOMONAS AERUGINOSA  MRSA PCR Screening     Status: None   Collection Time: 09/09/15  7:14 PM  Result Value Ref Range Status   MRSA by PCR NEGATIVE NEGATIVE Final    Comment:        The GeneXpert MRSA Assay (FDA approved for NASAL specimens only), is one component of a comprehensive MRSA colonization surveillance program. It is not intended to diagnose MRSA infection nor to guide or monitor treatment for MRSA infections.      Invalid input(s): PROCALCITONIN, LACTICACIDVEN   Radiology Studies: Ct Abdomen Pelvis Wo Contrast  09/11/2015  CLINICAL DATA:  Pseudomonas sepsis with recurrent gross hematuria. Recent cysto showed severe trabeculation and bladder diverticuli but no stones or tumors. EXAM: CT ABDOMEN AND PELVIS WITHOUT CONTRAST TECHNIQUE: Multidetector CT imaging of the abdomen and pelvis was performed following the standard protocol without IV contrast. COMPARISON:  04/24/2013 FINDINGS: Lower chest: Small to moderate bilateral pleural effusions  with dependent lower lobe atelectasis. Hepatobiliary: No focal abnormality in the liver on this study without intravenous contrast. No evidence of hepatomegaly. Gallbladder surgically absent. No intrahepatic or extrahepatic biliary dilation. Pancreas: Pancreatic parenchyma is diffusely atrophic. Spleen: No splenomegaly. No focal mass lesion. Adrenals/Urinary Tract: No adrenal nodule or mass. 2.2 cm interpolar right renal lesion measures water density and was about 2 cm on the previous study. Although not fully characterized given lack of intravenous contrast, this is compatible with a cyst. A pair of small probable cysts in the upper pole of the left kidney are also not substantially changed measuring about 11 mm each. No evidence for renal stones. No hydronephrosis. No ureteral stones or hydroureter. Bladder decompressed by Foley catheter. Gas in the bladder lumen may be related to recent cystoscopy or presence of a Foley catheter. Stomach/Bowel: Stomach is nondistended. No gastric wall thickening. Patient is status post distal gastrectomy Duodenum is normally positioned as is the ligament of Treitz. No small bowel wall thickening. No small bowel dilatation. The terminal ileum is normal. The appendix is not visualized, but there is no edema or inflammation in the region of the cecum. Diverticular changes are noted in the left colon without evidence of diverticulitis. Vascular/Lymphatic: There is abdominal aortic atherosclerosis without aneurysm. There is no gastrohepatic or hepatoduodenal ligament lymphadenopathy. No intraperitoneal or retroperitoneal lymphadenopathy. No pelvic sidewall lymphadenopathy. Reproductive: Prostate gland is markedly enlarged. Other: No intraperitoneal free fluid. Musculoskeletal: Left inguinal hernia contains a loop of sigmoid colon without complicating features. Diffuse body wall edema is evident. Bone windows reveal no worrisome lytic or sclerotic osseous lesions. IMPRESSION: 1. No  specific findings by CT to explain the patient's history of hematuria. Specifically, no urinary obstruction or urinary stones. Marked prostatomegaly is evident. 2. Small to moderate bilateral pleural effusions with dependent atelectasis in the lower lobes. 3. Small bilateral renal cysts. 4. Status post distal gastrectomy. 5. Left inguinal hernia contains a loop of sigmoid colon without complicating features.  Imaging findings of potential clinical significance: Abdominal Aortic Atherosclerois (ICD10-170.0) Electronically Signed   By: Misty Stanley M.D.   On: 09/11/2015 10:49        Scheduled Meds: . antiseptic oral rinse  7 mL Mouth Rinse BID  . levothyroxine  50 mcg Oral QAC breakfast  . pantoprazole  40 mg Oral Daily  . piperacillin-tazobactam (ZOSYN)  IV  3.375 g Intravenous Q8H  . sodium chloride flush  3 mL Intravenous Q12H   Continuous Infusions:     LOS: 3 days    Time spent: 35 minutes    Romesha Scherer A, MD [facility] Pager [phone]  If 7PM-7AM, please contact night-coverage [email or web address] Password [facility] 09/12/2015, 4:32 PM

## 2015-09-12 NOTE — Progress Notes (Signed)
Assessment: Pseudomonas sepsis, Rx Zosyn, and now sensitive to Cipro. Pt was considered for Cipro last week, but noted computer warning for potential drug interaction. May want to discuss with pharmacy prior to discharge.   Urologically, he has a negative CT for upper tract disease, and cysto shows end-stage prostate obstructive changes. He should be restared on his generic Jalyn, 1/day. It will take 6 months for full effect.   Plan:  OK for d/c from Urology standpoint, when medically stable.. Please restart generic Jalyn on discharge ( pt should have med at [facility]). Very supportive family. Has f/u in my office already.     Subjective: Patient reports feeling better after bowel movement. CT yesterday neg upper tract abnormality. WBC improved with Zosyn. C/s below.   Objective: Vital signs in last 24 hours: Temp:  [97.4 F (36.3 C)-98.4 F (36.9 C)] 98.4 F (36.9 C) (07/11 0522) Pulse Rate:  [65-79] 71 (07/11 0522) Resp:  [17-24] 17 (07/11 0522) BP: (124-153)/(59-90) 153/90 mmHg (07/11 0522) SpO2:  [99 %-100 %] 99 % (07/11 0522) Weight:  [73 kg (160 lb 15 oz)] 73 kg (160 lb 15 oz) (07/10 2121)A  Intake/Output from previous day: 07/10 0701 - 07/11 0700 In: 780 [P.O.:430; I.V.:300; IV Piggyback:50] Out: 1050 [Urine:1050] Intake/Output this shift: Total I/O In: 30 [P.O.:30] Out: -   Past Medical History  Diagnosis Date  . Atrial fibrillation ([place])   . Dementia   . Chronic kidney disease   . Anemia due to chronic blood loss 07/2015  . Cholecystitis   . CKD (chronic kidney disease)   . Alzheimer disease   . DOE (dyspnea on exertion)     Physical Exam:  Lungs - Normal respiratory effort, chest expands symmetrically.  Abdomen - Soft, non-tender & non-distended.  Lab Results:  Recent Labs  09/09/15 1120 09/10/15 0246 09/11/15 0648  WBC 8.5 27.3* 17.1*  HGB 10.1* 8.5* 9.4*  HCT 32.7* 27.7* 30.1*   BMET  Recent Labs  09/10/15 0246 09/11/15 0648  NA 137 138   K 4.1 3.9  CL 110 109  CO2 21* 23  GLUCOSE 134* 94  BUN 23* 24*  CREATININE 1.98* 2.00*  CALCIUM 7.8* 8.4*   No results for input(s): LABURIN in the last 72 hours. Results for orders placed or performed during the hospital encounter of 09/09/15  Blood Culture (routine x 2)     Status: Abnormal (Preliminary result)   Collection Time: 09/09/15 11:20 AM  Result Value Ref Range Status   Specimen Description BLOOD RIGHT FOREARM  Final   Special Requests BOTTLES DRAWN AEROBIC AND ANAEROBIC  5CC  Final   Culture  Setup Time   Final    GRAM NEGATIVE RODS IN BOTH AEROBIC AND ANAEROBIC BOTTLES Organism ID to follow CRITICAL RESULT CALLED TO, READ BACK BY AND VERIFIED WITH: J MARKEL 09/10/15 @ 21 M VESTAL    Culture (A)  Final    PSEUDOMONAS AERUGINOSA SUSCEPTIBILITIES TO FOLLOW    Report Status PENDING  Incomplete  Blood Culture ID Panel (Reflexed)     Status: Abnormal   Collection Time: 09/09/15 11:20 AM  Result Value Ref Range Status   Enterococcus species NOT DETECTED NOT DETECTED Final   Vancomycin resistance NOT DETECTED NOT DETECTED Final   Listeria monocytogenes NOT DETECTED NOT DETECTED Final   Staphylococcus species NOT DETECTED NOT DETECTED Final   Staphylococcus aureus NOT DETECTED NOT DETECTED Final   Methicillin resistance NOT DETECTED NOT DETECTED Final   Streptococcus species NOT DETECTED NOT DETECTED  Final   Streptococcus agalactiae NOT DETECTED NOT DETECTED Final   Streptococcus pneumoniae NOT DETECTED NOT DETECTED Final   Streptococcus pyogenes NOT DETECTED NOT DETECTED Final   Acinetobacter baumannii NOT DETECTED NOT DETECTED Final   Enterobacteriaceae species NOT DETECTED NOT DETECTED Final   Enterobacter cloacae complex NOT DETECTED NOT DETECTED Final   Escherichia coli NOT DETECTED NOT DETECTED Final   Klebsiella oxytoca NOT DETECTED NOT DETECTED Final   Klebsiella pneumoniae NOT DETECTED NOT DETECTED Final   Proteus species NOT DETECTED NOT DETECTED Final    Serratia marcescens NOT DETECTED NOT DETECTED Final   Carbapenem resistance NOT DETECTED NOT DETECTED Final   Haemophilus influenzae NOT DETECTED NOT DETECTED Final   Neisseria meningitidis NOT DETECTED NOT DETECTED Final   Pseudomonas aeruginosa DETECTED (A) NOT DETECTED Final    Comment: CRITICAL RESULT CALLED TO, READ BACK BY AND VERIFIED WITH: J MARKEL 09/10/15 @ 0935 M VESTAL    Candida albicans NOT DETECTED NOT DETECTED Final   Candida glabrata NOT DETECTED NOT DETECTED Final   Candida krusei NOT DETECTED NOT DETECTED Final   Candida parapsilosis NOT DETECTED NOT DETECTED Final   Candida tropicalis NOT DETECTED NOT DETECTED Final  Blood Culture (routine x 2)     Status: None (Preliminary result)   Collection Time: 09/09/15 11:41 AM  Result Value Ref Range Status   Specimen Description BLOOD RIGHT HAND  Final   Special Requests BOTTLES DRAWN AEROBIC ONLY  3CC  Final   Culture  Setup Time   Final    GRAM NEGATIVE RODS AEROBIC BOTTLE ONLY CRITICAL RESULT CALLED TO, READ BACK BY AND VERIFIED WITH: J MARKEL 09/10/15 @ 47 M VESTAL    Culture GRAM NEGATIVE RODS  Final   Report Status PENDING  Incomplete  Urine culture     Status: Abnormal   Collection Time: 09/09/15  2:12 PM  Result Value Ref Range Status   Specimen Description URINE, CATHETERIZED  Final   Special Requests NONE  Final   Culture >=100,000 COLONIES/mL PSEUDOMONAS AERUGINOSA (A)  Final   Report Status 09/11/2015 FINAL  Final   Organism ID, Bacteria PSEUDOMONAS AERUGINOSA (A)  Final      Susceptibility   Pseudomonas aeruginosa - MIC*    CEFTAZIDIME 4 SENSITIVE Sensitive     CIPROFLOXACIN <=0.25 SENSITIVE Sensitive     GENTAMICIN <=1 SENSITIVE Sensitive     IMIPENEM 2 SENSITIVE Sensitive     PIP/TAZO <=4 SENSITIVE Sensitive     CEFEPIME 2 SENSITIVE Sensitive     * >=100,000 COLONIES/mL PSEUDOMONAS AERUGINOSA  MRSA PCR Screening     Status: None   Collection Time: 09/09/15  7:14 PM  Result Value Ref Range Status    MRSA by PCR NEGATIVE NEGATIVE Final    Comment:        The GeneXpert MRSA Assay (FDA approved for NASAL specimens only), is one component of a comprehensive MRSA colonization surveillance program. It is not intended to diagnose MRSA infection nor to guide or monitor treatment for MRSA infections.     Studies/Results: Ct Abdomen Pelvis Wo Contrast  09/11/2015  CLINICAL DATA:  Pseudomonas sepsis with recurrent gross hematuria. Recent cysto showed severe trabeculation and bladder diverticuli but no stones or tumors. EXAM: CT ABDOMEN AND PELVIS WITHOUT CONTRAST TECHNIQUE: Multidetector CT imaging of the abdomen and pelvis was performed following the standard protocol without IV contrast. COMPARISON:  04/24/2013 FINDINGS: Lower chest: Small to moderate bilateral pleural effusions with dependent lower lobe atelectasis. Hepatobiliary: No focal abnormality  in the liver on this study without intravenous contrast. No evidence of hepatomegaly. Gallbladder surgically absent. No intrahepatic or extrahepatic biliary dilation. Pancreas: Pancreatic parenchyma is diffusely atrophic. Spleen: No splenomegaly. No focal mass lesion. Adrenals/Urinary Tract: No adrenal nodule or mass. 2.2 cm interpolar right renal lesion measures water density and was about 2 cm on the previous study. Although not fully characterized given lack of intravenous contrast, this is compatible with a cyst. A pair of small probable cysts in the upper pole of the left kidney are also not substantially changed measuring about 11 mm each. No evidence for renal stones. No hydronephrosis. No ureteral stones or hydroureter. Bladder decompressed by Foley catheter. Gas in the bladder lumen may be related to recent cystoscopy or presence of a Foley catheter. Stomach/Bowel: Stomach is nondistended. No gastric wall thickening. Patient is status post distal gastrectomy Duodenum is normally positioned as is the ligament of Treitz. No small bowel wall  thickening. No small bowel dilatation. The terminal ileum is normal. The appendix is not visualized, but there is no edema or inflammation in the region of the cecum. Diverticular changes are noted in the left colon without evidence of diverticulitis. Vascular/Lymphatic: There is abdominal aortic atherosclerosis without aneurysm. There is no gastrohepatic or hepatoduodenal ligament lymphadenopathy. No intraperitoneal or retroperitoneal lymphadenopathy. No pelvic sidewall lymphadenopathy. Reproductive: Prostate gland is markedly enlarged. Other: No intraperitoneal free fluid. Musculoskeletal: Left inguinal hernia contains a loop of sigmoid colon without complicating features. Diffuse body wall edema is evident. Bone windows reveal no worrisome lytic or sclerotic osseous lesions. IMPRESSION: 1. No specific findings by CT to explain the patient's history of hematuria. Specifically, no urinary obstruction or urinary stones. Marked prostatomegaly is evident. 2. Small to moderate bilateral pleural effusions with dependent atelectasis in the lower lobes. 3. Small bilateral renal cysts. 4. Status post distal gastrectomy. 5. Left inguinal hernia contains a loop of sigmoid colon without complicating features. Imaging findings of potential clinical significance: Abdominal Aortic Atherosclerois (ICD10-170.0) Electronically Signed   By: Misty Stanley M.D.   On: 09/11/2015 10:49      Asyah Candler I Ayyan Sites 09/12/2015, 6:16 AM

## 2015-09-12 NOTE — Evaluation (Signed)
Physical Therapy Evaluation Patient Details Name: Justin Chambers MRN: [id number] DOB: Mar 16, 1923 Today's Date: 09/12/2015   History of Present Illness  82 y/o admitted with gross hematuria.  PMH includes a fib, CKD, dementia  Clinical Impression  Pt admitted with above diagnosis. Pt currently with functional limitations due to the deficits listed below (see PT Problem List). Pt will benefit from skilled PT to increase their independence and safety with mobility to allow discharge to the venue listed below.  Pt ambulating with cane with min/guard, although pt reports close to his baseline level.  Recommend HHPT and discussed using RW until he gets stronger, but he is refusing at this time.     Follow Up Recommendations Home health PT;Supervision - Intermittent    Equipment Recommendations  None recommended by PT    Recommendations for Other Services       Precautions / Restrictions Precautions Precautions: Fall      Mobility  Bed Mobility Overal bed mobility: Modified Independent             General bed mobility comments: bed flat without rail  Transfers Overall transfer level: Needs assistance Equipment used: Straight cane Transfers: Sit to/from Stand Sit to Stand: Supervision         General transfer comment: Stood with S only  Ambulation/Gait Ambulation/Gait assistance: Min guard Ambulation Distance (Feet): 220 Feet Assistive device: Straight cane Gait Pattern/deviations: Decreased step length - right;Wide base of support     General Gait Details: Amb with cane with MIN/guard and reports feeling close to baseline.  Decreased R step length with increased R LE ER.  Ambulated 60' with RW and pt states he doesn't feel any difference and states he isn't going to use the 3WRW he has at home anyway.  Stairs            Wheelchair Mobility    Modified Rankin (Stroke Patients Only)       Balance Overall balance assessment: Needs assistance   Sitting  balance-Leahy Scale: Good       Standing balance-Leahy Scale: Fair Standing balance comment: Static fair, dynamic fair-/poor+                             Pertinent Vitals/Pain Pain Assessment: No/denies pain    Home Living Family/patient expects to be discharged to:: Private residence ([facility]) Living Arrangements: Alone   Type of Home: Independent living facility         Home Equipment: Kasandra Knudsen - single point;Other (comment);Grab bars - tub/shower;Grab bars - toilet (3WRW) Additional Comments: Amb to cafeteria  with cane    Prior Function Level of Independence: Independent with assistive device(s)         Comments: AIde 4 hrs for 2 days week that drives him to things     Hand Dominance   Dominant Hand: Right    Extremity/Trunk Assessment   Upper Extremity Assessment: Overall WFL for tasks assessed           Lower Extremity Assessment: Overall WFL for tasks assessed      Cervical / Trunk Assessment: Normal  Communication   Communication: HOH  Cognition Arousal/Alertness: Awake/alert Behavior During Therapy: WFL for tasks assessed/performed Overall Cognitive Status: Within Functional Limits for tasks assessed (hx of dementia, but no confusion noted during session)                      General Comments  Exercises        Assessment/Plan    PT Assessment Patient needs continued PT services  PT Diagnosis Difficulty walking   PT Problem List Decreased activity tolerance;Decreased balance;Decreased mobility;Decreased knowledge of use of DME  PT Treatment Interventions Gait training;Functional mobility training;Therapeutic activities;Therapeutic exercise;DME instruction;Balance training;Patient/family education   PT Goals (Current goals can be found in the Care Plan section) Acute Rehab PT Goals Patient Stated Goal: return to [facility] PT Goal Formulation: With patient/family Time For Goal Achievement: 09/19/15 Potential to  Achieve Goals: Good    Frequency Min 3X/week   Barriers to discharge        Co-evaluation               End of Session Equipment Utilized During Treatment: Gait belt Activity Tolerance: Patient tolerated treatment well Patient left: in chair;with call bell/phone within reach;with family/visitor present;Other (comment) (chair pad, but no alarm box in room.  NT aware.) Nurse Communication: Mobility status         Time: [id number] PT Time Calculation (min) (ACUTE ONLY): 37 min   Charges:   PT Evaluation $PT Eval Moderate Complexity: 1 Procedure PT Treatments $Gait Training: 8-22 mins   PT G Codes:        Ravon Mortellaro LUBECK 09/12/2015, 4:14 PM

## 2015-09-12 NOTE — Progress Notes (Addendum)
Patient has not picked up edoxaban [facility]) from [facility] since May of 2017.  He also has an allergy listed to [facility] with no reaction noted.  Noted discharge summary from Dr. Daleen Bo on 07/31/2015 stated he should hold Savaysa until he followed up with PCP. I do not note any other outpatient notes within the [facility] since this time.  There are note from urology office associated with [facility] (Dr. Rosana Hoes) mentioning hematuria on 08/21/2015, but again no notes mentioning plans for [facility] are in [place].  I called [facility] at [facility], but since he is independent living side, they do not have information regarding his medication list or allergies. I attempted to clarify allergies and Savaysa status with patient, however he could not remember a specific timeline. He seemed to think he was taking Savaysa before he came in (however, refill history does not support this).  Will attempt to clarify allergies and medication list with patient's daughter.   Pandora Mccrackin D. Chaquana Nichols, PharmD, BCPS Clinical Pharmacist Pager: [phone] 09/12/2015 1:58 PM   ADDENDUM Talked with patient and his daughter this morning (7/12). He seemed more alert today than yesterday. Daughter and patient confirmed he is NOT taking Savaysa. Also confirmed no antibiotic allergies.  Savaysa "allergy" changed to intolerance with reaction of making Hgb drop, removed from PTA med list as well.  Jacelynn Hayton D. Read Bonelli, PharmD, BCPS Clinical Pharmacist Pager: [phone] 09/13/2015 10:29 AM

## 2015-09-13 DIAGNOSIS — I482 Chronic atrial fibrillation: Secondary | ICD-10-CM

## 2015-09-13 DIAGNOSIS — N183 Chronic kidney disease, stage 3 (moderate): Secondary | ICD-10-CM

## 2015-09-13 DIAGNOSIS — R310 Gross hematuria: Secondary | ICD-10-CM

## 2015-09-13 LAB — CBC
HCT: 28.2 % — ABNORMAL LOW (ref 39.0–52.0)
Hemoglobin: 9.1 g/dL — ABNORMAL LOW (ref 13.0–17.0)
MCH: 23.2 pg — AB (ref 26.0–34.0)
MCHC: 32.3 g/dL (ref 30.0–36.0)
MCV: 71.8 fL — ABNORMAL LOW (ref 78.0–100.0)
Platelets: 229 10*3/uL (ref 150–400)
RBC: 3.93 MIL/uL — ABNORMAL LOW (ref 4.22–5.81)
RDW: 23.2 % — ABNORMAL HIGH (ref 11.5–15.5)
WBC: 7.4 10*3/uL (ref 4.0–10.5)

## 2015-09-13 LAB — BASIC METABOLIC PANEL
Anion gap: 6 (ref 5–15)
BUN: 15 mg/dL (ref 6–20)
CALCIUM: 8.3 mg/dL — AB (ref 8.9–10.3)
CO2: 25 mmol/L (ref 22–32)
CREATININE: 1.85 mg/dL — AB (ref 0.61–1.24)
Chloride: 107 mmol/L (ref 101–111)
GFR calc Af Amer: 35 mL/min — ABNORMAL LOW (ref >60)
GFR calc non Af Amer: 30 mL/min — ABNORMAL LOW (ref >60)
GLUCOSE: 87 mg/dL (ref 65–99)
Potassium: 4.1 mmol/L (ref 3.5–5.1)
Sodium: 138 mmol/L (ref 135–145)

## 2015-09-13 MED ORDER — CIPROFLOXACIN HCL 500 MG PO TABS: 500.0000 mg | ORAL_TABLET | Freq: Two times a day (BID) | ORAL | Status: DC

## 2015-09-13 NOTE — Progress Notes (Signed)
Occupational Therapy Evaluation Patient Details Name: Justin Chambers MRN: [id number] DOB: 22-Jul-1923 Today's Date: 09/13/2015    History of Present Illness 82 y/o admitted with gross hematuria.  PMH includes a fib, CKD, dementia   Clinical Impression   Patient presents to OT with decreased ADL independence and safety due to the deficits listed below. He will benefit from skilled OT to maximize function and to facilitate a safe discharge. OT will follow.    Follow Up Recommendations  Supervision - Intermittent;Home health OT    Equipment Recommendations  Tub/shower seat    Recommendations for Other Services       Precautions / Restrictions Precautions Precautions: Fall Restrictions Weight Bearing Restrictions: No      Mobility Bed Mobility Overal bed mobility: Modified Independent             General bed mobility comments: bed flat without rail  Transfers Overall transfer level: Needs assistance Equipment used: Straight cane Transfers: Sit to/from Stand Sit to Stand: Supervision         General transfer comment: Stood with S only; initially min guard A until he achieved balance    Balance                                            ADL Overall ADL's : Needs assistance/impaired Eating/Feeding: Independent;Bed level   Grooming: Wash/dry hands;Wash/dry face;Oral care;Brushing hair;Supervision/safety;Standing   Upper Body Bathing: Set up;Supervision/ safety;Sitting   Lower Body Bathing: Minimal assistance;Sit to/from stand           Toilet Transfer: Min guard;Ambulation;Regular Toilet (cane)   Toileting- Clothing Manipulation and Hygiene: Total assistance Toileting - Clothing Manipulation Details (indicate cue type and reason): has foley     Functional mobility during ADLs: Min guard;Cane General ADL Comments: Patient refuses RW during session and wants to use single point cane. He does reach out to hold onto furniture/cabinets  during ambulation in room with other hand. Increased time to come into standing and achieve balance.      Vision     Perception     Praxis      Pertinent Vitals/Pain Pain Assessment: No/denies pain     Hand Dominance Right   Extremity/Trunk Assessment Upper Extremity Assessment Upper Extremity Assessment: Overall WFL for tasks assessed   Lower Extremity Assessment Lower Extremity Assessment: Defer to PT evaluation   Cervical / Trunk Assessment Cervical / Trunk Assessment: Normal   Communication Communication Communication: HOH   Cognition Arousal/Alertness: Awake/alert Behavior During Therapy: WFL for tasks assessed/performed Overall Cognitive Status: Within Functional Limits for tasks assessed                     General Comments       Exercises       Shoulder Instructions      Home Living Family/patient expects to be discharged to:: Private residence ([facility]) Living Arrangements: Alone Available Help at Discharge: Personal care attendant;Available PRN/intermittently (aide 2 days/week x 4 hours) Type of Home: [place]: One level     Bathroom Shower/Tub: Teacher,  years/pre: Standard     Home Equipment: Cane - single point;Other (comment);Grab bars - tub/shower;Grab bars - toilet (also has 3-wheeled RW)   Additional Comments: Amb to cafeteria  with cane      Prior Functioning/Environment  Level of Independence: Independent with assistive device(s)        Comments: also has aide 2 days/week x 4 hours    OT Diagnosis: Generalized weakness   OT Problem List: Decreased strength;Decreased activity tolerance;Impaired balance (sitting and/or standing);Decreased knowledge of use of DME or AE   OT Treatment/Interventions: Self-care/ADL training;DME and/or AE instruction;Therapeutic activities;Patient/family education    OT Goals(Current goals can be found in the care plan section)  Acute Rehab OT Goals Patient Stated Goal: return to [facility] OT Goal Formulation: With patient Time For Goal Achievement: 09/27/15 Potential to Achieve Goals: Good  OT Frequency: Min 2X/week   Barriers to D/C:            Co-evaluation              End of Session Equipment Utilized During Treatment:  (cane)  Activity Tolerance: Patient tolerated treatment well Patient left: in bed;with call bell/phone within reach;with bed alarm set   Time: [id number] OT Time Calculation (min): 24 min Charges:  OT General Charges $OT Visit: 1 Procedure OT Evaluation $OT Eval Low Complexity: 1 Procedure OT Treatments $Self Care/Home Management : 8-22 mins G-Codes:    Wirt Hemmerich A 2015/09/16, 11:13 AM

## 2015-09-13 NOTE — Consult Note (Signed)
   [facility] CM Inpatient Consult   09/13/2015  Justin Chambers 1923/12/03 [id number]   Patient screened for [facility] services. Chart reviewed. Noted plans are to return to [facility] along with home health services. Will not engage for [facility] services at this time.  Marthenia Rolling, MSN-Ed, RN,BSN [facility] Liaison [phone]

## 2015-09-13 NOTE — Progress Notes (Signed)
Physical Therapy Treatment Patient Details Name: Justin Chambers MRN: [id number] DOB: [birth year] Today's Date: 09/13/2015    History of Present Illness 82 y/o admitted with gross hematuria.  PMH includes a fib, CKD, dementia    PT Comments    Pt scheduled to d/c home today.  He declined balance assessment tests, but was agreeable to ambulation in hallway with cane.  Improved balance with straight path gait. Con't to recommend HHPT at [place].  Follow Up Recommendations  Home health PT;Supervision - Intermittent     Equipment Recommendations  None recommended by PT    Recommendations for Other Services       Precautions / Restrictions Precautions Precautions: Fall Restrictions Weight Bearing Restrictions: No    Mobility  Bed Mobility Overal bed mobility: Modified Independent             General bed mobility comments: bed flat without rail  Transfers Overall transfer level: Needs assistance Equipment used: Straight cane Transfers: Sit to/from Stand Sit to Stand: Supervision         General transfer comment: Pt educated on standing for a bit prior to ambulation   Ambulation/Gait Ambulation/Gait assistance: Supervision;Min guard Ambulation Distance (Feet): 250 Feet Assistive device: Straight cane Gait Pattern/deviations: Decreased step length - right;Wide base of support     General Gait Details: Amb with S on straight path and min/guard for turns. Attempted to do portions of DGI, but pt not wanting to. "nothing is wrong with my neck"- after head turns.  Pt reports R ankle surgery in past and that is why his leg is ER.   Stairs            Wheelchair Mobility    Modified Rankin (Stroke Patients Only)       Balance Overall balance assessment: Needs assistance   Sitting balance-Leahy Scale: Good       Standing balance-Leahy Scale: Fair                      Cognition Arousal/Alertness: Awake/alert Behavior During Therapy: WFL for  tasks assessed/performed Overall Cognitive Status: Within Functional Limits for tasks assessed                      Exercises      General Comments        Pertinent Vitals/Pain Pain Assessment: No/denies pain    Home Living Family/patient expects to be discharged to:: Private residence ([facility]) Living Arrangements: Alone Available Help at Discharge: Personal care attendant;Available PRN/intermittently (aide 2 days/week x 4 hours) Type of Home: [place]: One level Home Equipment: Cane - single point;Other (comment);Grab bars - tub/shower;Grab bars - toilet (also has 3-wheeled RW) Additional Comments: Amb to cafeteria  with cane    Prior Function Level of Independence: Independent with assistive device(s)      Comments: also has aide 2 days/week x 4 hours   PT Goals (current goals can now be found in the care plan section) Acute Rehab PT Goals Patient Stated Goal: return to [facility] PT Goal Formulation: With patient/family Time For Goal Achievement: 09/19/15 Potential to Achieve Goals: Good Progress towards PT goals: Progressing toward goals    Frequency  Min 3X/week    PT Plan Current plan remains appropriate    Co-evaluation             End of Session Equipment Utilized During Treatment: Gait belt Activity Tolerance: Patient tolerated treatment well  Patient left: in chair;with call bell/phone within reach;with family/visitor present     Time: 1230-1247 PT Time Calculation (min) (ACUTE ONLY): 17 min  Charges:  $Gait Training: 8-22 mins                    G Codes:      Justin Chambers 09/13/2015, 1:19 PM

## 2015-09-13 NOTE — Discharge Summary (Signed)
Physician Discharge Summary  Justin Chambers [id number] DOB: October 17, 1923 DOA: 09/09/2015  PCP: Geoffery Lyons, MD  Admit date: 09/09/2015 Discharge date: 09/13/2015  Time spent: 45 minutes  Recommendations for Outpatient Follow-up:  1. Discharged with FOley Catheter, FU with Urologist Dr.Tannembaum in 1 week 2. Defer decision on Savaysa to PCP, pt is very reluctant to restart this  Discharge Diagnoses:    Pseudomonas UTI   HEmaturia   End-stage prostate obstructive changes noted on Cystoscopy   Chronic kidney disease (CKD) stage G3a/A3, moderately decreased glomerular filtration rate (GFR) between 45-59 mL/min/1.73 square meter and albuminuria creatinine ratio greater than 300 mg/g   Atrial fibrillation (HCC)   Alzheimer disease   Hematuria   Hypotension   Hematuria, gross   Discharge Condition: stable  Diet recommendation: heart healthy  Filed Weights   09/11/15 0500 09/11/15 2121 09/12/15 2107  Weight: 71 kg (156 lb 8.4 oz) 73 kg (160 lb 15 oz) 71.4 kg (157 lb 6.5 oz)    History of present illness:  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 Presents to the ER with blood in urine that he describes as bright red, "like blood from your vein". He was see in Dr. Arlyn Leak office on 7/06 and has cystoscopy for hematuria. The hematuria worsened after scope. Patient states no cancer nor stones were found but Dr. Gaynelle Arabian planned to do a CT scan on Monday. He was also given Keflex which he has been taking without reaction. Patient was found to have fever by EMS and blood dripping down leg  Hospital Course:  Severe sepsis -Patient presented with fever of 103, WBC of 27.3 due to pseudomonas UTI/bacteremia. -The lactic acid is 3.86 indicating end organ damage. -Hypotension is nicely responded to IV fluid boluses. -Discontinued IV fluids, sepsis physiology resolved.  Pseudomonas aeruginosa bacteremia  and UTI -Blood culture and be BCID showed pseudomonas aeruginosa, urine culture also grew pseudomonas -Initially patient was on Rocephin, this was switched to Zosyn, switched to oral Cipro at discharge for 80more days  Gross hematuria -This is likely secondary to recent instrumentation, BPH and pseudomonal UTI in the settings of anticoagulation with Savaysa. -Hematuria resolved,   -Seen by Dr.Tannenbaum with Urology in consultation, he has been worked up for this and had a negative CT for upper tract disease and Cystoscopy showed End stage prostate obstructive changes -had a foley placed by Urology this admission due to urinary retention and this is continued at discharge, Fu with Dr.tannembaum in 1 week for Voiding trial  BPH -Follow-up with Dr. Gaynelle Arabian as outpatient, continue Foley catheter on discharge.  A fib -Rate is controlled, continue home medications. -CHA2DS2-VASc is probably 2 for age, was on 76 previously which was recently held due to suspected upper Gi bleed -Did not resume anticoagulation because of concurrent gross hematuria.  -since hematuria resolved, I discussed with patient regarding restarting Savaysa, considering risks/benefits however he is extremely reluctant to restart this and hence advised to discuss this further with PCP upon Follow up  Dementia -Has very mild dementia, appears at baseline  CKD- III -At baseline, creatinine baseline 1.9.  Consultations:  Urology Dr.Tannenbaum  Discharge Exam: Filed Vitals:   09/13/15 0645 09/13/15 0742  BP: 159/76 151/71  Pulse: 85 65  Temp: 98.5 F (36.9 C) 97.9 F (36.6 C)  Resp: 16 17    General: AAOx3 Cardiovascular: S1S2/RRR Respiratory: CTAB  Discharge Instructions   Discharge Instructions  Diet - low sodium heart healthy    Complete by:  As directed      Increase activity slowly    Complete by:  As directed           Current Discharge Medication List    START taking these medications    Details  ciprofloxacin (CIPRO) 500 MG tablet Take 1 tablet (500 mg total) by mouth 2 (two) times daily. For 5days Qty: 10 tablet, Refills: 0      CONTINUE these medications which have NOT CHANGED   Details  chlorhexidine (PERIDEX) 0.12 % solution Use as directed 15 mLs in the mouth or throat 2 (two) times daily.    Cholecalciferol (VITAMIN D-3) 1000 UNITS CAPS Take 3,000 Units by mouth daily.     Dutasteride-Tamsulosin HCl 0.5-0.4 MG CAPS Take 1 capsule by mouth daily.    levothyroxine (SYNTHROID, LEVOTHROID) 50 MCG tablet Take 50 mcg by mouth daily before breakfast.  Refills: 0    Multiple Vitamins-Minerals (MULTIVITAMIN ADULTS 50+) TABS Take 1 tablet by mouth daily.    pantoprazole (PROTONIX) 40 MG tablet Take 1 tablet (40 mg total) by mouth daily. Qty: 30 tablet, Refills: 3      STOP taking these medications     cephALEXin (KEFLEX) 250 MG capsule      edoxaban (SAVAYSA) 30 MG TABS tablet      naproxen sodium (ANAPROX) 550 MG tablet        Allergies  Allergen Reactions  . Latex   . Savaysa [Edoxaban] Other (See Comments)    Unknown   Follow-up Information    Follow up with SIGMUND I TANNENBAUM, MD. Schedule an appointment as soon as possible for a visit in 1 week.   Specialty:  Urology   Contact information:   [place] [address] [phone]        The results of significant diagnostics from this hospitalization (including imaging, microbiology, ancillary and laboratory) are listed below for reference.    Significant Diagnostic Studies: Ct Abdomen Pelvis Wo Contrast  09/11/2015  CLINICAL DATA:  Pseudomonas sepsis with recurrent gross hematuria. Recent cysto showed severe trabeculation and bladder diverticuli but no stones or tumors. EXAM: CT ABDOMEN AND PELVIS WITHOUT CONTRAST TECHNIQUE: Multidetector CT imaging of the abdomen and pelvis was performed following the standard protocol without IV contrast. COMPARISON:  04/24/2013 FINDINGS: Lower  chest: Small to moderate bilateral pleural effusions with dependent lower lobe atelectasis. Hepatobiliary: No focal abnormality in the liver on this study without intravenous contrast. No evidence of hepatomegaly. Gallbladder surgically absent. No intrahepatic or extrahepatic biliary dilation. Pancreas: Pancreatic parenchyma is diffusely atrophic. Spleen: No splenomegaly. No focal mass lesion. Adrenals/Urinary Tract: No adrenal nodule or mass. 2.2 cm interpolar right renal lesion measures water density and was about 2 cm on the previous study. Although not fully characterized given lack of intravenous contrast, this is compatible with a cyst. A pair of small probable cysts in the upper pole of the left kidney are also not substantially changed measuring about 11 mm each. No evidence for renal stones. No hydronephrosis. No ureteral stones or hydroureter. Bladder decompressed by Foley catheter. Gas in the bladder lumen may be related to recent cystoscopy or presence of a Foley catheter. Stomach/Bowel: Stomach is nondistended. No gastric wall thickening. Patient is status post distal gastrectomy Duodenum is normally positioned as is the ligament of Treitz. No small bowel wall thickening. No small bowel dilatation. The terminal ileum is normal. The appendix is not visualized, but  there is no edema or inflammation in the region of the cecum. Diverticular changes are noted in the left colon without evidence of diverticulitis. Vascular/Lymphatic: There is abdominal aortic atherosclerosis without aneurysm. There is no gastrohepatic or hepatoduodenal ligament lymphadenopathy. No intraperitoneal or retroperitoneal lymphadenopathy. No pelvic sidewall lymphadenopathy. Reproductive: Prostate gland is markedly enlarged. Other: No intraperitoneal free fluid. Musculoskeletal: Left inguinal hernia contains a loop of sigmoid colon without complicating features. Diffuse body wall edema is evident. Bone windows reveal no worrisome  lytic or sclerotic osseous lesions. IMPRESSION: 1. No specific findings by CT to explain the patient's history of hematuria. Specifically, no urinary obstruction or urinary stones. Marked prostatomegaly is evident. 2. Small to moderate bilateral pleural effusions with dependent atelectasis in the lower lobes. 3. Small bilateral renal cysts. 4. Status post distal gastrectomy. 5. Left inguinal hernia contains a loop of sigmoid colon without complicating features. Imaging findings of potential clinical significance: Abdominal Aortic Atherosclerois (ICD10-170.0) Electronically Signed   By: Misty Stanley M.D.   On: 09/11/2015 10:49   Dg Chest Port 1 View  09/09/2015  CLINICAL DATA:  Hematuria for 6 days, sepsis, atrial fibrillation, dementia/Alzheimer's, chronic kidney disease EXAM: PORTABLE CHEST 1 VIEW COMPARISON:  Portable exam 1241 hours compared to 05/09/2008 FINDINGS: Minimal enlargement of cardiac silhouette. Atherosclerotic calcification and tortuosity of thoracic aorta. Mediastinal contours and pulmonary vascularity normal. Bronchitic changes with minimal bibasilar atelectasis. Lungs otherwise clear. No pleural effusion or pneumothorax. Bones demineralized. IMPRESSION: Minimal enlargement of cardiac silhouette. Bronchitic changes with minimal bibasilar atelectasis. Aortic atherosclerosis. Electronically Signed   By: Lavonia Dana M.D.   On: 09/09/2015 12:57    Microbiology: Recent Results (from the past 240 hour(s))  Blood Culture (routine x 2)     Status: Abnormal   Collection Time: 09/09/15 11:20 AM  Result Value Ref Range Status   Specimen Description BLOOD RIGHT FOREARM  Final   Special Requests BOTTLES DRAWN AEROBIC AND ANAEROBIC  5CC  Final   Culture  Setup Time   Final    GRAM NEGATIVE RODS IN BOTH AEROBIC AND ANAEROBIC BOTTLES CRITICAL RESULT CALLED TO, READ BACK BY AND VERIFIED WITH: J [facility] 09/10/15 @ 40 M VESTAL    Culture PSEUDOMONAS AERUGINOSA (A)  Final   Report Status 09/12/2015  FINAL  Final   Organism ID, Bacteria PSEUDOMONAS AERUGINOSA  Final      Susceptibility   Pseudomonas aeruginosa - MIC*    CEFTAZIDIME 4 SENSITIVE Sensitive     CIPROFLOXACIN <=0.25 SENSITIVE Sensitive     GENTAMICIN <=1 SENSITIVE Sensitive     IMIPENEM 2 SENSITIVE Sensitive     PIP/TAZO 8 SENSITIVE Sensitive     CEFEPIME 2 SENSITIVE Sensitive     * PSEUDOMONAS AERUGINOSA  Blood Culture ID Panel (Reflexed)     Status: Abnormal   Collection Time: 09/09/15 11:20 AM  Result Value Ref Range Status   Enterococcus species NOT DETECTED NOT DETECTED Final   Vancomycin resistance NOT DETECTED NOT DETECTED Final   Listeria monocytogenes NOT DETECTED NOT DETECTED Final   Staphylococcus species NOT DETECTED NOT DETECTED Final   Staphylococcus aureus NOT DETECTED NOT DETECTED Final   Methicillin resistance NOT DETECTED NOT DETECTED Final   Streptococcus species NOT DETECTED NOT DETECTED Final   Streptococcus agalactiae NOT DETECTED NOT DETECTED Final   Streptococcus pneumoniae NOT DETECTED NOT DETECTED Final   Streptococcus pyogenes NOT DETECTED NOT DETECTED Final   Acinetobacter baumannii NOT DETECTED NOT DETECTED Final   Enterobacteriaceae species NOT DETECTED NOT DETECTED Final  Enterobacter cloacae complex NOT DETECTED NOT DETECTED Final   Escherichia coli NOT DETECTED NOT DETECTED Final   Klebsiella oxytoca NOT DETECTED NOT DETECTED Final   Klebsiella pneumoniae NOT DETECTED NOT DETECTED Final   Proteus species NOT DETECTED NOT DETECTED Final   Serratia marcescens NOT DETECTED NOT DETECTED Final   Carbapenem resistance NOT DETECTED NOT DETECTED Final   Haemophilus influenzae NOT DETECTED NOT DETECTED Final   Neisseria meningitidis NOT DETECTED NOT DETECTED Final   Pseudomonas aeruginosa DETECTED (A) NOT DETECTED Final    Comment: CRITICAL RESULT CALLED TO, READ BACK BY AND VERIFIED WITH: J MARKEL 09/10/15 @ 0935 M VESTAL    Candida albicans NOT DETECTED NOT DETECTED Final   Candida  glabrata NOT DETECTED NOT DETECTED Final   Candida krusei NOT DETECTED NOT DETECTED Final   Candida parapsilosis NOT DETECTED NOT DETECTED Final   Candida tropicalis NOT DETECTED NOT DETECTED Final  Blood Culture (routine x 2)     Status: Abnormal   Collection Time: 09/09/15 11:41 AM  Result Value Ref Range Status   Specimen Description BLOOD RIGHT HAND  Final   Special Requests BOTTLES DRAWN AEROBIC ONLY  3CC  Final   Culture  Setup Time   Final    GRAM NEGATIVE RODS AEROBIC BOTTLE ONLY CRITICAL RESULT CALLED TO, READ BACK BY AND VERIFIED WITH: J MARKEL 09/10/15 @ 59 M VESTAL    Culture (A)  Final    PSEUDOMONAS AERUGINOSA SUSCEPTIBILITIES PERFORMED ON PREVIOUS CULTURE WITHIN THE LAST 5 DAYS.    Report Status 09/12/2015 FINAL  Final  Urine culture     Status: Abnormal   Collection Time: 09/09/15  2:12 PM  Result Value Ref Range Status   Specimen Description URINE, CATHETERIZED  Final   Special Requests NONE  Final   Culture >=100,000 COLONIES/mL PSEUDOMONAS AERUGINOSA (A)  Final   Report Status 09/11/2015 FINAL  Final   Organism ID, Bacteria PSEUDOMONAS AERUGINOSA (A)  Final      Susceptibility   Pseudomonas aeruginosa - MIC*    CEFTAZIDIME 4 SENSITIVE Sensitive     CIPROFLOXACIN <=0.25 SENSITIVE Sensitive     GENTAMICIN <=1 SENSITIVE Sensitive     IMIPENEM 2 SENSITIVE Sensitive     PIP/TAZO <=4 SENSITIVE Sensitive     CEFEPIME 2 SENSITIVE Sensitive     * >=100,000 COLONIES/mL PSEUDOMONAS AERUGINOSA  MRSA PCR Screening     Status: None   Collection Time: 09/09/15  7:14 PM  Result Value Ref Range Status   MRSA by PCR NEGATIVE NEGATIVE Final    Comment:        The GeneXpert MRSA Assay (FDA approved for NASAL specimens only), is one component of a comprehensive MRSA colonization surveillance program. It is not intended to diagnose MRSA infection nor to guide or monitor treatment for MRSA infections.      Labs: Basic Metabolic Panel:  Recent Labs Lab  09/09/15 1120 09/09/15 1740 09/10/15 0246 09/11/15 0648 09/13/15 0436  NA 138  --  137 138 138  K 4.0  --  4.1 3.9 4.1  CL 107  --  110 109 107  CO2 21*  --  21* 23 25  GLUCOSE 139*  --  134* 94 87  BUN 19  --  23* 24* 15  CREATININE 2.02*  --  1.98* 2.00* 1.85*  CALCIUM 8.5*  --  7.8* 8.4* 8.3*  MG  --  1.6*  --   --   --    Liver Function Tests:  Recent Labs Lab 09/09/15 1120  AST 24  ALT 12*  ALKPHOS 72  BILITOT 0.8  PROT 5.3*  ALBUMIN 2.8*   No results for input(s): LIPASE, AMYLASE in the last 168 hours. No results for input(s): AMMONIA in the last 168 hours. CBC:  Recent Labs Lab 09/09/15 1120 09/10/15 0246 09/11/15 0648 09/13/15 0436  WBC 8.5 27.3* 17.1* 7.4  NEUTROABS 8.2*  --   --   --   HGB 10.1* 8.5* 9.4* 9.1*  HCT 32.7* 27.7* 30.1* 28.2*  MCV 73.3* 74.3* 72.9* 71.8*  PLT 232 189 210 229   Cardiac Enzymes: No results for input(s): CKTOTAL, CKMB, CKMBINDEX, TROPONINI in the last 168 hours. BNP: BNP (last 3 results) No results for input(s): BNP in the last 8760 hours.  ProBNP (last 3 results) No results for input(s): PROBNP in the last 8760 hours.  CBG: No results for input(s): GLUCAP in the last 168 hours.     SignedDomenic Polite MD.  [facility] 09/13/2015, 9:42 AM

## 2015-09-13 NOTE — Care Management Note (Signed)
Case Management Note  Patient Details  Name: RUPERT BAUMHARDT MRN: [id number] Date of Birth: 12-22-1923  Subjective/Objective:        CM following for progression and d/c planning..             Action/Plan: 09/13/2015 Pt is resident of independent living at [facility]. [place] services will be provided by [facility] in the facility and HHPT will be provided by [facility] . 07/  Expected Discharge Date:    09/13/2015              Expected Discharge Plan:  [place]  In-House Referral:  NA  Discharge planning Services  CM Consult  Post Acute Care Choice:  Home Health Choice offered to:  NA, Patient (Pt independent living facility , [facility], has [facility] services with [facility] for [facility] services and [place] for PT. )  DME Arranged:  N/A DME Agency:  NA  HH Arranged:  RN, PT [place] Agency:  [facility], Other - See comment ([facility] with office at [place], pt residence.  [facility] will provide HHPT. )  Status of Service:  Completed, signed off  If discussed at [place] of Stay Meetings, dates discussed:    Additional Comments:  Adron Bene, RN 09/13/2015, 10:37 AM

## 2015-09-14 DIAGNOSIS — R2681 Unsteadiness on feet: Secondary | ICD-10-CM | POA: Diagnosis not present

## 2015-09-14 DIAGNOSIS — Z87440 Personal history of urinary (tract) infections: Secondary | ICD-10-CM | POA: Diagnosis not present

## 2015-09-14 DIAGNOSIS — M6281 Muscle weakness (generalized): Secondary | ICD-10-CM | POA: Diagnosis not present

## 2015-09-14 DIAGNOSIS — R262 Difficulty in walking, not elsewhere classified: Secondary | ICD-10-CM | POA: Diagnosis not present

## 2015-09-15 DIAGNOSIS — R2681 Unsteadiness on feet: Secondary | ICD-10-CM | POA: Diagnosis not present

## 2015-09-15 DIAGNOSIS — Z87440 Personal history of urinary (tract) infections: Secondary | ICD-10-CM | POA: Diagnosis not present

## 2015-09-15 DIAGNOSIS — M6281 Muscle weakness (generalized): Secondary | ICD-10-CM | POA: Diagnosis not present

## 2015-09-15 DIAGNOSIS — R262 Difficulty in walking, not elsewhere classified: Secondary | ICD-10-CM | POA: Diagnosis not present

## 2015-09-15 NOTE — Care Management (Signed)
Received call from pt daugther in [place] on 09/14/2015 at approx 5pm,  re [facility] services for this pt . This family member had been upset on 09/13/2015 when the pt was d/c and "no one was coming out to empty the foley bag tonight"  on the day of d/c. This CM explained to the pt daughter that [facility] services do not provide for someone to come empty a urinary drainage bag and that this pt and another daughter had been instructed at the time of d/c on the care and emptying of the urinary catheter bag. This CM additionally had contacted pt's independent living facility, [facility] and arranged for HHPT and [place] services. Orders were faxed to both providers at that facility.  Per the call of 09/14/2015 , the RN  to whom this CM was referred by [facility] for [facility] services actually represented a home health aide service and the RN evaluated the pt and charged a fee for the eval upon the pt return to [facility].  Orders faxed to [facility] and [facility] at that facility , were clearly for the services of an RN in the pt home as provided by his Medicare.  Clearly the [facility] rep who assist this CM was unaware of the difference between [facility] services and [facility] aide services. Furthermore,  when this CM contacted the " [place]" at [facility] she failed to disclose that she represented a hh aide service or that a fee would be charged for her services.  This CM offered at this point to schedule a HHRN with [facility] [place] who provides services to the ALF area of [facility], however the pt daughter declined this offer.  Adron Bene RN MPH case manager

## 2015-09-18 DIAGNOSIS — N390 Urinary tract infection, site not specified: Secondary | ICD-10-CM | POA: Diagnosis not present

## 2015-09-18 DIAGNOSIS — R310 Gross hematuria: Secondary | ICD-10-CM | POA: Diagnosis not present

## 2015-09-18 DIAGNOSIS — R350 Frequency of micturition: Secondary | ICD-10-CM | POA: Diagnosis not present

## 2015-09-18 DIAGNOSIS — N401 Enlarged prostate with lower urinary tract symptoms: Secondary | ICD-10-CM | POA: Diagnosis not present

## 2015-09-18 DIAGNOSIS — F0150 Vascular dementia without behavioral disturbance: Secondary | ICD-10-CM | POA: Diagnosis not present

## 2015-09-18 DIAGNOSIS — R600 Localized edema: Secondary | ICD-10-CM | POA: Diagnosis not present

## 2015-09-18 DIAGNOSIS — Z6822 Body mass index (BMI) 22.0-22.9, adult: Secondary | ICD-10-CM | POA: Diagnosis not present

## 2015-09-19 DIAGNOSIS — R2681 Unsteadiness on feet: Secondary | ICD-10-CM | POA: Diagnosis not present

## 2015-09-19 DIAGNOSIS — R262 Difficulty in walking, not elsewhere classified: Secondary | ICD-10-CM | POA: Diagnosis not present

## 2015-09-19 DIAGNOSIS — Z87440 Personal history of urinary (tract) infections: Secondary | ICD-10-CM | POA: Diagnosis not present

## 2015-09-19 DIAGNOSIS — M6281 Muscle weakness (generalized): Secondary | ICD-10-CM | POA: Diagnosis not present

## 2015-09-21 DIAGNOSIS — R262 Difficulty in walking, not elsewhere classified: Secondary | ICD-10-CM | POA: Diagnosis not present

## 2015-09-21 DIAGNOSIS — Z87440 Personal history of urinary (tract) infections: Secondary | ICD-10-CM | POA: Diagnosis not present

## 2015-09-21 DIAGNOSIS — R2681 Unsteadiness on feet: Secondary | ICD-10-CM | POA: Diagnosis not present

## 2015-09-21 DIAGNOSIS — M6281 Muscle weakness (generalized): Secondary | ICD-10-CM | POA: Diagnosis not present

## 2015-09-22 DIAGNOSIS — M6281 Muscle weakness (generalized): Secondary | ICD-10-CM | POA: Diagnosis not present

## 2015-09-22 DIAGNOSIS — R600 Localized edema: Secondary | ICD-10-CM | POA: Diagnosis not present

## 2015-09-22 DIAGNOSIS — Z0000 Encounter for general adult medical examination without abnormal findings: Secondary | ICD-10-CM | POA: Diagnosis not present

## 2015-09-22 DIAGNOSIS — Z6822 Body mass index (BMI) 22.0-22.9, adult: Secondary | ICD-10-CM | POA: Diagnosis not present

## 2015-09-22 DIAGNOSIS — R2681 Unsteadiness on feet: Secondary | ICD-10-CM | POA: Diagnosis not present

## 2015-09-22 DIAGNOSIS — Z87440 Personal history of urinary (tract) infections: Secondary | ICD-10-CM | POA: Diagnosis not present

## 2015-09-22 DIAGNOSIS — D500 Iron deficiency anemia secondary to blood loss (chronic): Secondary | ICD-10-CM | POA: Diagnosis not present

## 2015-09-22 DIAGNOSIS — N401 Enlarged prostate with lower urinary tract symptoms: Secondary | ICD-10-CM | POA: Diagnosis not present

## 2015-09-22 DIAGNOSIS — R0609 Other forms of dyspnea: Secondary | ICD-10-CM | POA: Diagnosis not present

## 2015-09-22 DIAGNOSIS — R2689 Other abnormalities of gait and mobility: Secondary | ICD-10-CM | POA: Diagnosis not present

## 2015-09-22 DIAGNOSIS — R262 Difficulty in walking, not elsewhere classified: Secondary | ICD-10-CM | POA: Diagnosis not present

## 2015-09-25 DIAGNOSIS — R2681 Unsteadiness on feet: Secondary | ICD-10-CM | POA: Diagnosis not present

## 2015-09-25 DIAGNOSIS — R262 Difficulty in walking, not elsewhere classified: Secondary | ICD-10-CM | POA: Diagnosis not present

## 2015-09-25 DIAGNOSIS — Z87440 Personal history of urinary (tract) infections: Secondary | ICD-10-CM | POA: Diagnosis not present

## 2015-09-25 DIAGNOSIS — M6281 Muscle weakness (generalized): Secondary | ICD-10-CM | POA: Diagnosis not present

## 2015-09-26 DIAGNOSIS — R2681 Unsteadiness on feet: Secondary | ICD-10-CM | POA: Diagnosis not present

## 2015-09-26 DIAGNOSIS — M6281 Muscle weakness (generalized): Secondary | ICD-10-CM | POA: Diagnosis not present

## 2015-09-26 DIAGNOSIS — R262 Difficulty in walking, not elsewhere classified: Secondary | ICD-10-CM | POA: Diagnosis not present

## 2015-09-26 DIAGNOSIS — Z87440 Personal history of urinary (tract) infections: Secondary | ICD-10-CM | POA: Diagnosis not present

## 2015-09-28 DIAGNOSIS — Z87440 Personal history of urinary (tract) infections: Secondary | ICD-10-CM | POA: Diagnosis not present

## 2015-09-28 DIAGNOSIS — R2681 Unsteadiness on feet: Secondary | ICD-10-CM | POA: Diagnosis not present

## 2015-09-28 DIAGNOSIS — M6281 Muscle weakness (generalized): Secondary | ICD-10-CM | POA: Diagnosis not present

## 2015-09-28 DIAGNOSIS — R262 Difficulty in walking, not elsewhere classified: Secondary | ICD-10-CM | POA: Diagnosis not present

## 2015-10-02 DIAGNOSIS — H6123 Impacted cerumen, bilateral: Secondary | ICD-10-CM | POA: Diagnosis not present

## 2015-10-02 DIAGNOSIS — Z6822 Body mass index (BMI) 22.0-22.9, adult: Secondary | ICD-10-CM | POA: Diagnosis not present

## 2015-10-02 DIAGNOSIS — H9193 Unspecified hearing loss, bilateral: Secondary | ICD-10-CM | POA: Diagnosis not present

## 2015-10-03 DIAGNOSIS — Z87440 Personal history of urinary (tract) infections: Secondary | ICD-10-CM | POA: Diagnosis not present

## 2015-10-03 DIAGNOSIS — R278 Other lack of coordination: Secondary | ICD-10-CM | POA: Diagnosis not present

## 2015-10-03 DIAGNOSIS — M6281 Muscle weakness (generalized): Secondary | ICD-10-CM | POA: Diagnosis not present

## 2015-10-03 DIAGNOSIS — R262 Difficulty in walking, not elsewhere classified: Secondary | ICD-10-CM | POA: Diagnosis not present

## 2015-10-03 DIAGNOSIS — R2681 Unsteadiness on feet: Secondary | ICD-10-CM | POA: Diagnosis not present

## 2015-10-03 DIAGNOSIS — N393 Stress incontinence (female) (male): Secondary | ICD-10-CM | POA: Diagnosis not present

## 2015-10-05 DIAGNOSIS — R262 Difficulty in walking, not elsewhere classified: Secondary | ICD-10-CM | POA: Diagnosis not present

## 2015-10-05 DIAGNOSIS — Z87440 Personal history of urinary (tract) infections: Secondary | ICD-10-CM | POA: Diagnosis not present

## 2015-10-05 DIAGNOSIS — R278 Other lack of coordination: Secondary | ICD-10-CM | POA: Diagnosis not present

## 2015-10-05 DIAGNOSIS — N393 Stress incontinence (female) (male): Secondary | ICD-10-CM | POA: Diagnosis not present

## 2015-10-05 DIAGNOSIS — M6281 Muscle weakness (generalized): Secondary | ICD-10-CM | POA: Diagnosis not present

## 2015-10-05 DIAGNOSIS — R2681 Unsteadiness on feet: Secondary | ICD-10-CM | POA: Diagnosis not present

## 2015-10-06 DIAGNOSIS — R262 Difficulty in walking, not elsewhere classified: Secondary | ICD-10-CM | POA: Diagnosis not present

## 2015-10-06 DIAGNOSIS — Z87440 Personal history of urinary (tract) infections: Secondary | ICD-10-CM | POA: Diagnosis not present

## 2015-10-06 DIAGNOSIS — R2681 Unsteadiness on feet: Secondary | ICD-10-CM | POA: Diagnosis not present

## 2015-10-06 DIAGNOSIS — R278 Other lack of coordination: Secondary | ICD-10-CM | POA: Diagnosis not present

## 2015-10-06 DIAGNOSIS — N393 Stress incontinence (female) (male): Secondary | ICD-10-CM | POA: Diagnosis not present

## 2015-10-06 DIAGNOSIS — M6281 Muscle weakness (generalized): Secondary | ICD-10-CM | POA: Diagnosis not present

## 2015-10-09 DIAGNOSIS — N183 Chronic kidney disease, stage 3 (moderate): Secondary | ICD-10-CM | POA: Diagnosis not present

## 2015-10-09 DIAGNOSIS — I350 Nonrheumatic aortic (valve) stenosis: Secondary | ICD-10-CM | POA: Diagnosis not present

## 2015-10-09 DIAGNOSIS — Z6821 Body mass index (BMI) 21.0-21.9, adult: Secondary | ICD-10-CM | POA: Diagnosis not present

## 2015-10-09 DIAGNOSIS — D500 Iron deficiency anemia secondary to blood loss (chronic): Secondary | ICD-10-CM | POA: Diagnosis not present

## 2015-10-09 DIAGNOSIS — K4090 Unilateral inguinal hernia, without obstruction or gangrene, not specified as recurrent: Secondary | ICD-10-CM | POA: Diagnosis not present

## 2015-10-10 ENCOUNTER — Other Ambulatory Visit: Payer: Self-pay | Admitting: Surgery

## 2015-10-10 DIAGNOSIS — R2681 Unsteadiness on feet: Secondary | ICD-10-CM | POA: Diagnosis not present

## 2015-10-10 DIAGNOSIS — R278 Other lack of coordination: Secondary | ICD-10-CM | POA: Diagnosis not present

## 2015-10-10 DIAGNOSIS — N393 Stress incontinence (female) (male): Secondary | ICD-10-CM | POA: Diagnosis not present

## 2015-10-10 DIAGNOSIS — K4030 Unilateral inguinal hernia, with obstruction, without gangrene, not specified as recurrent: Secondary | ICD-10-CM | POA: Diagnosis not present

## 2015-10-10 DIAGNOSIS — M6281 Muscle weakness (generalized): Secondary | ICD-10-CM | POA: Diagnosis not present

## 2015-10-10 DIAGNOSIS — Z87440 Personal history of urinary (tract) infections: Secondary | ICD-10-CM | POA: Diagnosis not present

## 2015-10-10 DIAGNOSIS — R262 Difficulty in walking, not elsewhere classified: Secondary | ICD-10-CM | POA: Diagnosis not present

## 2015-10-12 DIAGNOSIS — R2681 Unsteadiness on feet: Secondary | ICD-10-CM | POA: Diagnosis not present

## 2015-10-12 DIAGNOSIS — R262 Difficulty in walking, not elsewhere classified: Secondary | ICD-10-CM | POA: Diagnosis not present

## 2015-10-12 DIAGNOSIS — N393 Stress incontinence (female) (male): Secondary | ICD-10-CM | POA: Diagnosis not present

## 2015-10-12 DIAGNOSIS — R278 Other lack of coordination: Secondary | ICD-10-CM | POA: Diagnosis not present

## 2015-10-12 DIAGNOSIS — M6281 Muscle weakness (generalized): Secondary | ICD-10-CM | POA: Diagnosis not present

## 2015-10-12 DIAGNOSIS — Z87440 Personal history of urinary (tract) infections: Secondary | ICD-10-CM | POA: Diagnosis not present

## 2015-10-13 DIAGNOSIS — D500 Iron deficiency anemia secondary to blood loss (chronic): Secondary | ICD-10-CM | POA: Diagnosis not present

## 2015-10-13 DIAGNOSIS — M6281 Muscle weakness (generalized): Secondary | ICD-10-CM | POA: Diagnosis not present

## 2015-10-13 DIAGNOSIS — N393 Stress incontinence (female) (male): Secondary | ICD-10-CM | POA: Diagnosis not present

## 2015-10-13 DIAGNOSIS — R278 Other lack of coordination: Secondary | ICD-10-CM | POA: Diagnosis not present

## 2015-10-13 DIAGNOSIS — R262 Difficulty in walking, not elsewhere classified: Secondary | ICD-10-CM | POA: Diagnosis not present

## 2015-10-13 DIAGNOSIS — R2681 Unsteadiness on feet: Secondary | ICD-10-CM | POA: Diagnosis not present

## 2015-10-13 DIAGNOSIS — Z87440 Personal history of urinary (tract) infections: Secondary | ICD-10-CM | POA: Diagnosis not present

## 2015-10-13 DIAGNOSIS — Z6821 Body mass index (BMI) 21.0-21.9, adult: Secondary | ICD-10-CM | POA: Diagnosis not present

## 2015-10-17 DIAGNOSIS — M6281 Muscle weakness (generalized): Secondary | ICD-10-CM | POA: Diagnosis not present

## 2015-10-17 DIAGNOSIS — N393 Stress incontinence (female) (male): Secondary | ICD-10-CM | POA: Diagnosis not present

## 2015-10-17 DIAGNOSIS — Z87440 Personal history of urinary (tract) infections: Secondary | ICD-10-CM | POA: Diagnosis not present

## 2015-10-17 DIAGNOSIS — R262 Difficulty in walking, not elsewhere classified: Secondary | ICD-10-CM | POA: Diagnosis not present

## 2015-10-17 DIAGNOSIS — R2681 Unsteadiness on feet: Secondary | ICD-10-CM | POA: Diagnosis not present

## 2015-10-17 DIAGNOSIS — R278 Other lack of coordination: Secondary | ICD-10-CM | POA: Diagnosis not present

## 2015-10-19 ENCOUNTER — Encounter (HOSPITAL_COMMUNITY): Payer: Self-pay

## 2015-10-19 DIAGNOSIS — Z87440 Personal history of urinary (tract) infections: Secondary | ICD-10-CM | POA: Diagnosis not present

## 2015-10-19 DIAGNOSIS — M6281 Muscle weakness (generalized): Secondary | ICD-10-CM | POA: Diagnosis not present

## 2015-10-19 DIAGNOSIS — R2681 Unsteadiness on feet: Secondary | ICD-10-CM | POA: Diagnosis not present

## 2015-10-19 DIAGNOSIS — R278 Other lack of coordination: Secondary | ICD-10-CM | POA: Diagnosis not present

## 2015-10-19 DIAGNOSIS — N393 Stress incontinence (female) (male): Secondary | ICD-10-CM | POA: Diagnosis not present

## 2015-10-19 DIAGNOSIS — R262 Difficulty in walking, not elsewhere classified: Secondary | ICD-10-CM | POA: Diagnosis not present

## 2015-10-20 ENCOUNTER — Encounter (HOSPITAL_COMMUNITY): Payer: Self-pay

## 2015-10-20 ENCOUNTER — Encounter (HOSPITAL_COMMUNITY)
Admission: RE | Admit: 2015-10-20 | Discharge: 2015-10-20 | Disposition: A | Discharge status: Home / Self Care | Payer: Medicare Other | Source: Ambulatory Visit | Attending: Surgery | Admitting: Surgery

## 2015-10-20 DIAGNOSIS — M6281 Muscle weakness (generalized): Secondary | ICD-10-CM | POA: Diagnosis not present

## 2015-10-20 DIAGNOSIS — A419 Sepsis, unspecified organism: Secondary | ICD-10-CM | POA: Diagnosis not present

## 2015-10-20 DIAGNOSIS — R278 Other lack of coordination: Secondary | ICD-10-CM | POA: Diagnosis not present

## 2015-10-20 DIAGNOSIS — N189 Chronic kidney disease, unspecified: Secondary | ICD-10-CM | POA: Diagnosis not present

## 2015-10-20 DIAGNOSIS — Z87440 Personal history of urinary (tract) infections: Secondary | ICD-10-CM | POA: Diagnosis not present

## 2015-10-20 DIAGNOSIS — Z01812 Encounter for preprocedural laboratory examination: Secondary | ICD-10-CM | POA: Diagnosis not present

## 2015-10-20 DIAGNOSIS — Z7901 Long term (current) use of anticoagulants: Secondary | ICD-10-CM | POA: Diagnosis not present

## 2015-10-20 DIAGNOSIS — N393 Stress incontinence (female) (male): Secondary | ICD-10-CM | POA: Diagnosis not present

## 2015-10-20 DIAGNOSIS — R2681 Unsteadiness on feet: Secondary | ICD-10-CM | POA: Diagnosis not present

## 2015-10-20 DIAGNOSIS — R262 Difficulty in walking, not elsewhere classified: Secondary | ICD-10-CM | POA: Diagnosis not present

## 2015-10-20 HISTORY — DX: Other injury of unspecified body region, initial encounter: T14.8XXA

## 2015-10-20 HISTORY — DX: Hypothyroidism, unspecified: E03.9

## 2015-10-20 HISTORY — DX: Personal history of other medical treatment: Z92.89

## 2015-10-20 LAB — BASIC METABOLIC PANEL
Anion gap: 5 (ref 5–15)
BUN: 21 mg/dL — AB (ref 6–20)
CHLORIDE: 109 mmol/L (ref 101–111)
CO2: 26 mmol/L (ref 22–32)
Calcium: 8.6 mg/dL — ABNORMAL LOW (ref 8.9–10.3)
Creatinine, Ser: 1.88 mg/dL — ABNORMAL HIGH (ref 0.61–1.24)
GFR calc Af Amer: 35 mL/min — ABNORMAL LOW (ref >60)
GFR calc non Af Amer: 30 mL/min — ABNORMAL LOW (ref >60)
GLUCOSE: 137 mg/dL — AB (ref 65–99)
POTASSIUM: 4.2 mmol/L (ref 3.5–5.1)
Sodium: 140 mmol/L (ref 135–145)

## 2015-10-20 LAB — CBC
HEMATOCRIT: 30.3 % — AB (ref 39.0–52.0)
Hemoglobin: 9.3 g/dL — ABNORMAL LOW (ref 13.0–17.0)
MCH: 21.9 pg — ABNORMAL LOW (ref 26.0–34.0)
MCHC: 30.7 g/dL (ref 30.0–36.0)
MCV: 71.3 fL — AB (ref 78.0–100.0)
Platelets: 254 10*3/uL (ref 150–400)
RBC: 4.25 MIL/uL (ref 4.22–5.81)
RDW: 19.1 % — AB (ref 11.5–15.5)
WBC: 6 10*3/uL (ref 4.0–10.5)

## 2015-10-20 NOTE — Patient Instructions (Addendum)
FARMER KUNDINGER  10/20/2015   Your procedure is scheduled on: 10/24/15  [facility]  Entrance take [facility]  elevators to 3rd floor to  [place] at 7:30 AM.  Call this number if you have problems the morning of surgery [phone]   Remember: ONLY 1 PERSON MAY GO WITH YOU TO SHORT STAY TO GET  READY MORNING OF [place].  Do not eat food or drink liquids :After Midnight.     Take these medicines the morning of surgery with A SIP OF WATER: Levothyroxine (Synthroid), Pantoprazole (Protonix), Dutasteride-Tamsulosin.                                You may not have any metal on your body including hair pins and              piercings  Do not wear jewelry, make-up, lotions, powders or perfumes, deodorant             Do not wear nail polish.  Do not shave  48 hours prior to surgery.              Men may shave face and neck.   Do not bring valuables to the hospital. [place].  Contacts, dentures or bridgework may not be worn into surgery.  Leave suitcase in the car. After surgery it may be brought to your room.     Patients discharged the day of surgery will not be allowed to drive home.  Name and phone number of your driver:daughter  Special Instructions: N/A              Please read over the following fact sheets you were given: _____________________________________________________________________             [facility] - Preparing for Surgery Before surgery, you can play an important role.  Because skin is not sterile, your skin needs to be as free of germs as possible.  You can reduce the number of germs on your skin by washing with CHG (chlorahexidine gluconate) soap before surgery.  CHG is an antiseptic cleaner which kills germs and bonds with the skin to continue killing germs even after washing. Please DO NOT use if you have an allergy to CHG or antibacterial soaps.  If your skin  becomes reddened/irritated stop using the CHG and inform your nurse when you arrive at Short Stay. Do not shave (including legs and underarms) for at least 48 hours prior to the first CHG shower.  You may shave your face/neck. Please follow these instructions carefully:  1.  Shower with CHG Soap the night before surgery and the  morning of Surgery.  2.  If you choose to wash your hair, wash your hair first as usual with your  normal  shampoo.  3.  After you shampoo, rinse your hair and body thoroughly to remove the  shampoo.                           4.  Use CHG as you would any other liquid soap.  You can apply chg directly  to the skin and wash  Gently with a scrungie or clean washcloth.  5.  Apply the CHG Soap to your body ONLY FROM THE NECK DOWN.   Do not use on face/ open                           Wound or open sores. Avoid contact with eyes, ears mouth and genitals (private parts).                       Wash face,  Genitals (private parts) with your normal soap.             6.  Wash thoroughly, paying special attention to the area where your surgery  will be performed.  7.  Thoroughly rinse your body with warm water from the neck down.  8.  DO NOT shower/wash with your normal soap after using and rinsing off  the CHG Soap.                9.  Pat yourself dry with a clean towel.            10.  Wear clean pajamas.            11.  Place clean sheets on your bed the night of your first shower and do not  sleep with pets. Day of Surgery : Do not apply any lotions/deodorants the morning of surgery.  Please wear clean clothes to the hospital/surgery center.  FAILURE TO FOLLOW THESE INSTRUCTIONS MAY RESULT IN THE CANCELLATION OF YOUR SURGERY PATIENT SIGNATURE_________________________________  NURSE SIGNATURE__________________________________  ________________________________________________________________________

## 2015-10-20 NOTE — Pre-Procedure Instructions (Signed)
11:55am.  Pt was scheduled for pre-op appt at 11:30am. Has not arrived.  I have tried to call all numbers available for pt, but have had no answers and also no options to leave voice message.  Will let pre-op scheduler know and attempt to reschedule prior to surgery.

## 2015-10-20 NOTE — Pre-Procedure Instructions (Signed)
EKG 09-09-15 epic Echo 07-28-15 epic CXR 09-09-15 epic

## 2015-10-20 NOTE — Pre-Procedure Instructions (Signed)
EKG 09-27-15, Echo  07-28-15, CXR 09-19-15 Epic.

## 2015-10-23 ENCOUNTER — Encounter (HOSPITAL_COMMUNITY): Payer: Self-pay | Admitting: Certified Registered Nurse Anesthetist

## 2015-10-23 DIAGNOSIS — N393 Stress incontinence (female) (male): Secondary | ICD-10-CM | POA: Diagnosis not present

## 2015-10-23 DIAGNOSIS — M6281 Muscle weakness (generalized): Secondary | ICD-10-CM | POA: Diagnosis not present

## 2015-10-23 DIAGNOSIS — Z87440 Personal history of urinary (tract) infections: Secondary | ICD-10-CM | POA: Diagnosis not present

## 2015-10-23 DIAGNOSIS — R262 Difficulty in walking, not elsewhere classified: Secondary | ICD-10-CM | POA: Diagnosis not present

## 2015-10-23 DIAGNOSIS — R278 Other lack of coordination: Secondary | ICD-10-CM | POA: Diagnosis not present

## 2015-10-23 DIAGNOSIS — R2681 Unsteadiness on feet: Secondary | ICD-10-CM | POA: Diagnosis not present

## 2015-10-23 NOTE — H&P (Signed)
  Justin Chambers 10/10/2015 10:54 AM Location: [facility] Patient #: [id number] DOB: 05-Jul-1923 Widowed / Language: Cleophus Molt / Race: White Male   History of Present Illness (Quintrell Baze A. Ninfa Linden MD; 10/10/2015 11:12 AM) Patient words: inguinal hernia.  The patient is a 82 year old male who presents with an inguinal hernia. This gentleman was seen by Dr. Redmond Pulling in March and his hernia was easily reducible. It has now become chronically incarcerated with sigmoid colon. He has mild discomfort in the groin. During his admission with prostate issues in July, a CT scan showed the incarcerated hernia containing colon. He currently has no obstructive symptoms but is uncomfortable mildly.   Allergies (April Staton, CMA; 10/10/2015 10:55 AM) No Known Drug Allergies03/21/2017  Medication History (April Staton, CMA; 10/10/2015 10:58 AM) Levothyroxine Sodium (50MCG Tablet, Oral) Active. Jalyn (0.5-0.4MG  Capsule, Oral) Active. Protonix (40MG  Packet, Oral) Active. Vitamin D (Cholecalciferol) (Oral) Specific dose unknown - Active. Nu-Iron (150MG  Capsule, Oral) Active. Medications Reconciled  Vitals (April Staton CMA; 10/10/2015 10:58 AM) 10/10/2015 10:58 AM Weight: 145.13 lb Height: 68in Height was reported by patient. Body Surface Area: 1.78 m Body Mass Index: 22.07 kg/m  Pulse: 67 (Regular)  P.OX: 97% (Room air) BP: 120/74 (Sitting, Left Arm, Standard)       Physical Exam (Istvan Behar A. Ninfa Linden MD; 10/10/2015 11:12 AM) The physical exam findings are as follows: Note:On exam, the left inguinal hernia is now incarcerated. It is nontender but nonreducible. The rest of his abdomen is soft and nontender Lungs clear CV RRR Ext no edema Neuro grossly intact Walks with a walker     Assessment & Plan (Dakin Madani A. Ninfa Linden MD; 10/10/2015 11:13 AM) Nira Conn LEFT INGUINAL HERNIA (K40.30) Impression: I discussed the diagnosis with him. He is a retired Engineer, drilling and understand  this needs repair more urgently now as it is incarcerated and he is having some symptoms. I will plan on scheduling his surgery for 2 weeks from now when I am our acute-care surgeon [facility] long. I discussed the surgical risks with him in detail. He wishes to proceed. He will call back should he develop worsening discomfort

## 2015-10-24 ENCOUNTER — Encounter (HOSPITAL_COMMUNITY): Admission: RE | Payer: Self-pay | Source: Ambulatory Visit

## 2015-10-24 ENCOUNTER — Ambulatory Visit (HOSPITAL_COMMUNITY): Admission: RE | Admit: 2015-10-24 | Payer: Medicare Other | Source: Ambulatory Visit | Admitting: Surgery

## 2015-10-24 SURGERY — REPAIR, HERNIA, INGUINAL, ADULT
Anesthesia: General | Laterality: Left

## 2015-10-24 NOTE — Anesthesia Preprocedure Evaluation (Deleted)
Anesthesia Evaluation  Patient identified by MRN, date of birth, ID band Patient awake    Reviewed: Allergy & Precautions, H&P , NPO status , Patient's Chart, lab work & pertinent test results  History of Anesthesia Complications Negative for: history of anesthetic complications  Airway Mallampati: II  TM Distance: >3 FB Neck ROM: full    Dental no notable dental hx.    Pulmonary shortness of breath,    Pulmonary exam normal breath sounds clear to auscultation       Cardiovascular Normal cardiovascular exam+ dysrhythmias  Rhythm:regular Rate:Normal     Neuro/Psych negative neurological ROS     GI/Hepatic negative GI ROS, Neg liver ROS,   Endo/Other  Hypothyroidism   Renal/GU CRFRenal disease     Musculoskeletal   Abdominal   Peds  Hematology negative hematology ROS (+)   Anesthesia Other Findings   Reproductive/Obstetrics negative OB ROS                             Anesthesia Physical Anesthesia Plan  ASA: III  Anesthesia Plan: General   Post-op Pain Management:    Induction: Intravenous  Airway Management Planned: LMA  Additional Equipment:   Intra-op Plan:   Post-operative Plan:   Informed Consent: I have reviewed the patients History and Physical, chart, labs and discussed the procedure including the risks, benefits and alternatives for the proposed anesthesia with the patient or authorized representative who has indicated his/her understanding and acceptance.   Dental Advisory Given  Plan Discussed with: Anesthesiologist, CRNA and Surgeon  Anesthesia Plan Comments: (GA with LMA rec'd, has dementia, avoid benzodiazepines and long acting narcotics)        Anesthesia Quick Evaluation

## 2015-10-26 DIAGNOSIS — Z87440 Personal history of urinary (tract) infections: Secondary | ICD-10-CM | POA: Diagnosis not present

## 2015-10-26 DIAGNOSIS — R262 Difficulty in walking, not elsewhere classified: Secondary | ICD-10-CM | POA: Diagnosis not present

## 2015-10-26 DIAGNOSIS — M6281 Muscle weakness (generalized): Secondary | ICD-10-CM | POA: Diagnosis not present

## 2015-10-26 DIAGNOSIS — N393 Stress incontinence (female) (male): Secondary | ICD-10-CM | POA: Diagnosis not present

## 2015-10-26 DIAGNOSIS — R278 Other lack of coordination: Secondary | ICD-10-CM | POA: Diagnosis not present

## 2015-10-26 DIAGNOSIS — R2681 Unsteadiness on feet: Secondary | ICD-10-CM | POA: Diagnosis not present

## 2015-10-27 DIAGNOSIS — Z87440 Personal history of urinary (tract) infections: Secondary | ICD-10-CM | POA: Diagnosis not present

## 2015-10-27 DIAGNOSIS — M6281 Muscle weakness (generalized): Secondary | ICD-10-CM | POA: Diagnosis not present

## 2015-10-27 DIAGNOSIS — R2681 Unsteadiness on feet: Secondary | ICD-10-CM | POA: Diagnosis not present

## 2015-10-27 DIAGNOSIS — R262 Difficulty in walking, not elsewhere classified: Secondary | ICD-10-CM | POA: Diagnosis not present

## 2015-10-27 DIAGNOSIS — R278 Other lack of coordination: Secondary | ICD-10-CM | POA: Diagnosis not present

## 2015-10-27 DIAGNOSIS — N393 Stress incontinence (female) (male): Secondary | ICD-10-CM | POA: Diagnosis not present

## 2015-10-30 DIAGNOSIS — M6281 Muscle weakness (generalized): Secondary | ICD-10-CM | POA: Diagnosis not present

## 2015-10-30 DIAGNOSIS — N393 Stress incontinence (female) (male): Secondary | ICD-10-CM | POA: Diagnosis not present

## 2015-10-30 DIAGNOSIS — R262 Difficulty in walking, not elsewhere classified: Secondary | ICD-10-CM | POA: Diagnosis not present

## 2015-10-30 DIAGNOSIS — Z87440 Personal history of urinary (tract) infections: Secondary | ICD-10-CM | POA: Diagnosis not present

## 2015-10-30 DIAGNOSIS — R2681 Unsteadiness on feet: Secondary | ICD-10-CM | POA: Diagnosis not present

## 2015-10-30 DIAGNOSIS — R278 Other lack of coordination: Secondary | ICD-10-CM | POA: Diagnosis not present

## 2015-10-31 DIAGNOSIS — R2681 Unsteadiness on feet: Secondary | ICD-10-CM | POA: Diagnosis not present

## 2015-10-31 DIAGNOSIS — N393 Stress incontinence (female) (male): Secondary | ICD-10-CM | POA: Diagnosis not present

## 2015-10-31 DIAGNOSIS — Z87440 Personal history of urinary (tract) infections: Secondary | ICD-10-CM | POA: Diagnosis not present

## 2015-10-31 DIAGNOSIS — R278 Other lack of coordination: Secondary | ICD-10-CM | POA: Diagnosis not present

## 2015-10-31 DIAGNOSIS — R262 Difficulty in walking, not elsewhere classified: Secondary | ICD-10-CM | POA: Diagnosis not present

## 2015-10-31 DIAGNOSIS — M6281 Muscle weakness (generalized): Secondary | ICD-10-CM | POA: Diagnosis not present

## 2015-11-02 DIAGNOSIS — M6281 Muscle weakness (generalized): Secondary | ICD-10-CM | POA: Diagnosis not present

## 2015-11-02 DIAGNOSIS — Z87440 Personal history of urinary (tract) infections: Secondary | ICD-10-CM | POA: Diagnosis not present

## 2015-11-02 DIAGNOSIS — N393 Stress incontinence (female) (male): Secondary | ICD-10-CM | POA: Diagnosis not present

## 2015-11-02 DIAGNOSIS — R262 Difficulty in walking, not elsewhere classified: Secondary | ICD-10-CM | POA: Diagnosis not present

## 2015-11-02 DIAGNOSIS — R278 Other lack of coordination: Secondary | ICD-10-CM | POA: Diagnosis not present

## 2015-11-02 DIAGNOSIS — R2681 Unsteadiness on feet: Secondary | ICD-10-CM | POA: Diagnosis not present

## 2015-11-07 DIAGNOSIS — R2681 Unsteadiness on feet: Secondary | ICD-10-CM | POA: Diagnosis not present

## 2015-11-07 DIAGNOSIS — N393 Stress incontinence (female) (male): Secondary | ICD-10-CM | POA: Diagnosis not present

## 2015-11-07 DIAGNOSIS — M6281 Muscle weakness (generalized): Secondary | ICD-10-CM | POA: Diagnosis not present

## 2015-11-07 DIAGNOSIS — R278 Other lack of coordination: Secondary | ICD-10-CM | POA: Diagnosis not present

## 2015-11-07 DIAGNOSIS — R262 Difficulty in walking, not elsewhere classified: Secondary | ICD-10-CM | POA: Diagnosis not present

## 2015-11-07 DIAGNOSIS — Z87440 Personal history of urinary (tract) infections: Secondary | ICD-10-CM | POA: Diagnosis not present

## 2015-11-08 DIAGNOSIS — M6281 Muscle weakness (generalized): Secondary | ICD-10-CM | POA: Diagnosis not present

## 2015-11-08 DIAGNOSIS — R278 Other lack of coordination: Secondary | ICD-10-CM | POA: Diagnosis not present

## 2015-11-08 DIAGNOSIS — R2681 Unsteadiness on feet: Secondary | ICD-10-CM | POA: Diagnosis not present

## 2015-11-08 DIAGNOSIS — N393 Stress incontinence (female) (male): Secondary | ICD-10-CM | POA: Diagnosis not present

## 2015-11-08 DIAGNOSIS — R262 Difficulty in walking, not elsewhere classified: Secondary | ICD-10-CM | POA: Diagnosis not present

## 2015-11-08 DIAGNOSIS — Z87440 Personal history of urinary (tract) infections: Secondary | ICD-10-CM | POA: Diagnosis not present

## 2015-11-09 DIAGNOSIS — M6281 Muscle weakness (generalized): Secondary | ICD-10-CM | POA: Diagnosis not present

## 2015-11-09 DIAGNOSIS — R278 Other lack of coordination: Secondary | ICD-10-CM | POA: Diagnosis not present

## 2015-11-09 DIAGNOSIS — R262 Difficulty in walking, not elsewhere classified: Secondary | ICD-10-CM | POA: Diagnosis not present

## 2015-11-09 DIAGNOSIS — R2681 Unsteadiness on feet: Secondary | ICD-10-CM | POA: Diagnosis not present

## 2015-11-09 DIAGNOSIS — N393 Stress incontinence (female) (male): Secondary | ICD-10-CM | POA: Diagnosis not present

## 2015-11-09 DIAGNOSIS — Z87440 Personal history of urinary (tract) infections: Secondary | ICD-10-CM | POA: Diagnosis not present

## 2015-11-10 DIAGNOSIS — R262 Difficulty in walking, not elsewhere classified: Secondary | ICD-10-CM | POA: Diagnosis not present

## 2015-11-10 DIAGNOSIS — R410 Disorientation, unspecified: Secondary | ICD-10-CM | POA: Diagnosis not present

## 2015-11-10 DIAGNOSIS — Z87440 Personal history of urinary (tract) infections: Secondary | ICD-10-CM | POA: Diagnosis not present

## 2015-11-10 DIAGNOSIS — R278 Other lack of coordination: Secondary | ICD-10-CM | POA: Diagnosis not present

## 2015-11-10 DIAGNOSIS — N393 Stress incontinence (female) (male): Secondary | ICD-10-CM | POA: Diagnosis not present

## 2015-11-10 DIAGNOSIS — M6281 Muscle weakness (generalized): Secondary | ICD-10-CM | POA: Diagnosis not present

## 2015-11-10 DIAGNOSIS — R2681 Unsteadiness on feet: Secondary | ICD-10-CM | POA: Diagnosis not present

## 2015-11-13 DIAGNOSIS — Z87440 Personal history of urinary (tract) infections: Secondary | ICD-10-CM | POA: Diagnosis not present

## 2015-11-13 DIAGNOSIS — R278 Other lack of coordination: Secondary | ICD-10-CM | POA: Diagnosis not present

## 2015-11-13 DIAGNOSIS — R262 Difficulty in walking, not elsewhere classified: Secondary | ICD-10-CM | POA: Diagnosis not present

## 2015-11-13 DIAGNOSIS — M6281 Muscle weakness (generalized): Secondary | ICD-10-CM | POA: Diagnosis not present

## 2015-11-13 DIAGNOSIS — R2681 Unsteadiness on feet: Secondary | ICD-10-CM | POA: Diagnosis not present

## 2015-11-13 DIAGNOSIS — N393 Stress incontinence (female) (male): Secondary | ICD-10-CM | POA: Diagnosis not present

## 2015-11-14 DIAGNOSIS — N393 Stress incontinence (female) (male): Secondary | ICD-10-CM | POA: Diagnosis not present

## 2015-11-14 DIAGNOSIS — R278 Other lack of coordination: Secondary | ICD-10-CM | POA: Diagnosis not present

## 2015-11-14 DIAGNOSIS — M6281 Muscle weakness (generalized): Secondary | ICD-10-CM | POA: Diagnosis not present

## 2015-11-14 DIAGNOSIS — R262 Difficulty in walking, not elsewhere classified: Secondary | ICD-10-CM | POA: Diagnosis not present

## 2015-11-14 DIAGNOSIS — Z87440 Personal history of urinary (tract) infections: Secondary | ICD-10-CM | POA: Diagnosis not present

## 2015-11-14 DIAGNOSIS — R2681 Unsteadiness on feet: Secondary | ICD-10-CM | POA: Diagnosis not present

## 2015-11-15 DIAGNOSIS — R2681 Unsteadiness on feet: Secondary | ICD-10-CM | POA: Diagnosis not present

## 2015-11-15 DIAGNOSIS — Z87440 Personal history of urinary (tract) infections: Secondary | ICD-10-CM | POA: Diagnosis not present

## 2015-11-15 DIAGNOSIS — M6281 Muscle weakness (generalized): Secondary | ICD-10-CM | POA: Diagnosis not present

## 2015-11-15 DIAGNOSIS — R278 Other lack of coordination: Secondary | ICD-10-CM | POA: Diagnosis not present

## 2015-11-15 DIAGNOSIS — R262 Difficulty in walking, not elsewhere classified: Secondary | ICD-10-CM | POA: Diagnosis not present

## 2015-11-15 DIAGNOSIS — N393 Stress incontinence (female) (male): Secondary | ICD-10-CM | POA: Diagnosis not present

## 2015-11-16 DIAGNOSIS — R278 Other lack of coordination: Secondary | ICD-10-CM | POA: Diagnosis not present

## 2015-11-16 DIAGNOSIS — M6281 Muscle weakness (generalized): Secondary | ICD-10-CM | POA: Diagnosis not present

## 2015-11-16 DIAGNOSIS — N393 Stress incontinence (female) (male): Secondary | ICD-10-CM | POA: Diagnosis not present

## 2015-11-16 DIAGNOSIS — Z87440 Personal history of urinary (tract) infections: Secondary | ICD-10-CM | POA: Diagnosis not present

## 2015-11-16 DIAGNOSIS — R262 Difficulty in walking, not elsewhere classified: Secondary | ICD-10-CM | POA: Diagnosis not present

## 2015-11-16 DIAGNOSIS — R2681 Unsteadiness on feet: Secondary | ICD-10-CM | POA: Diagnosis not present

## 2015-11-17 DIAGNOSIS — N393 Stress incontinence (female) (male): Secondary | ICD-10-CM | POA: Diagnosis not present

## 2015-11-17 DIAGNOSIS — Z87440 Personal history of urinary (tract) infections: Secondary | ICD-10-CM | POA: Diagnosis not present

## 2015-11-17 DIAGNOSIS — M6281 Muscle weakness (generalized): Secondary | ICD-10-CM | POA: Diagnosis not present

## 2015-11-17 DIAGNOSIS — R2681 Unsteadiness on feet: Secondary | ICD-10-CM | POA: Diagnosis not present

## 2015-11-17 DIAGNOSIS — R262 Difficulty in walking, not elsewhere classified: Secondary | ICD-10-CM | POA: Diagnosis not present

## 2015-11-17 DIAGNOSIS — R278 Other lack of coordination: Secondary | ICD-10-CM | POA: Diagnosis not present

## 2015-11-20 DIAGNOSIS — R278 Other lack of coordination: Secondary | ICD-10-CM | POA: Diagnosis not present

## 2015-11-20 DIAGNOSIS — M6281 Muscle weakness (generalized): Secondary | ICD-10-CM | POA: Diagnosis not present

## 2015-11-20 DIAGNOSIS — R262 Difficulty in walking, not elsewhere classified: Secondary | ICD-10-CM | POA: Diagnosis not present

## 2015-11-20 DIAGNOSIS — R2681 Unsteadiness on feet: Secondary | ICD-10-CM | POA: Diagnosis not present

## 2015-11-20 DIAGNOSIS — Z87440 Personal history of urinary (tract) infections: Secondary | ICD-10-CM | POA: Diagnosis not present

## 2015-11-20 DIAGNOSIS — N393 Stress incontinence (female) (male): Secondary | ICD-10-CM | POA: Diagnosis not present

## 2015-11-21 DIAGNOSIS — M6281 Muscle weakness (generalized): Secondary | ICD-10-CM | POA: Diagnosis not present

## 2015-11-21 DIAGNOSIS — R278 Other lack of coordination: Secondary | ICD-10-CM | POA: Diagnosis not present

## 2015-11-21 DIAGNOSIS — R2681 Unsteadiness on feet: Secondary | ICD-10-CM | POA: Diagnosis not present

## 2015-11-21 DIAGNOSIS — Z87440 Personal history of urinary (tract) infections: Secondary | ICD-10-CM | POA: Diagnosis not present

## 2015-11-21 DIAGNOSIS — N393 Stress incontinence (female) (male): Secondary | ICD-10-CM | POA: Diagnosis not present

## 2015-11-21 DIAGNOSIS — R262 Difficulty in walking, not elsewhere classified: Secondary | ICD-10-CM | POA: Diagnosis not present

## 2015-11-22 DIAGNOSIS — R262 Difficulty in walking, not elsewhere classified: Secondary | ICD-10-CM | POA: Diagnosis not present

## 2015-11-22 DIAGNOSIS — N393 Stress incontinence (female) (male): Secondary | ICD-10-CM | POA: Diagnosis not present

## 2015-11-22 DIAGNOSIS — R278 Other lack of coordination: Secondary | ICD-10-CM | POA: Diagnosis not present

## 2015-11-22 DIAGNOSIS — R2681 Unsteadiness on feet: Secondary | ICD-10-CM | POA: Diagnosis not present

## 2015-11-22 DIAGNOSIS — Z87440 Personal history of urinary (tract) infections: Secondary | ICD-10-CM | POA: Diagnosis not present

## 2015-11-22 DIAGNOSIS — M6281 Muscle weakness (generalized): Secondary | ICD-10-CM | POA: Diagnosis not present

## 2015-11-23 DIAGNOSIS — R278 Other lack of coordination: Secondary | ICD-10-CM | POA: Diagnosis not present

## 2015-11-23 DIAGNOSIS — N393 Stress incontinence (female) (male): Secondary | ICD-10-CM | POA: Diagnosis not present

## 2015-11-23 DIAGNOSIS — R262 Difficulty in walking, not elsewhere classified: Secondary | ICD-10-CM | POA: Diagnosis not present

## 2015-11-23 DIAGNOSIS — R2681 Unsteadiness on feet: Secondary | ICD-10-CM | POA: Diagnosis not present

## 2015-11-23 DIAGNOSIS — M6281 Muscle weakness (generalized): Secondary | ICD-10-CM | POA: Diagnosis not present

## 2015-11-23 DIAGNOSIS — Z87440 Personal history of urinary (tract) infections: Secondary | ICD-10-CM | POA: Diagnosis not present

## 2015-11-24 DIAGNOSIS — Z87440 Personal history of urinary (tract) infections: Secondary | ICD-10-CM | POA: Diagnosis not present

## 2015-11-24 DIAGNOSIS — M6281 Muscle weakness (generalized): Secondary | ICD-10-CM | POA: Diagnosis not present

## 2015-11-24 DIAGNOSIS — R262 Difficulty in walking, not elsewhere classified: Secondary | ICD-10-CM | POA: Diagnosis not present

## 2015-11-24 DIAGNOSIS — R2681 Unsteadiness on feet: Secondary | ICD-10-CM | POA: Diagnosis not present

## 2015-11-24 DIAGNOSIS — N393 Stress incontinence (female) (male): Secondary | ICD-10-CM | POA: Diagnosis not present

## 2015-11-24 DIAGNOSIS — R278 Other lack of coordination: Secondary | ICD-10-CM | POA: Diagnosis not present

## 2015-11-28 DIAGNOSIS — Z87440 Personal history of urinary (tract) infections: Secondary | ICD-10-CM | POA: Diagnosis not present

## 2015-11-28 DIAGNOSIS — R278 Other lack of coordination: Secondary | ICD-10-CM | POA: Diagnosis not present

## 2015-11-28 DIAGNOSIS — M6281 Muscle weakness (generalized): Secondary | ICD-10-CM | POA: Diagnosis not present

## 2015-11-28 DIAGNOSIS — N393 Stress incontinence (female) (male): Secondary | ICD-10-CM | POA: Diagnosis not present

## 2015-11-28 DIAGNOSIS — R2681 Unsteadiness on feet: Secondary | ICD-10-CM | POA: Diagnosis not present

## 2015-11-28 DIAGNOSIS — R262 Difficulty in walking, not elsewhere classified: Secondary | ICD-10-CM | POA: Diagnosis not present

## 2015-11-29 DIAGNOSIS — N393 Stress incontinence (female) (male): Secondary | ICD-10-CM | POA: Diagnosis not present

## 2015-11-29 DIAGNOSIS — Z87440 Personal history of urinary (tract) infections: Secondary | ICD-10-CM | POA: Diagnosis not present

## 2015-11-29 DIAGNOSIS — M6281 Muscle weakness (generalized): Secondary | ICD-10-CM | POA: Diagnosis not present

## 2015-11-29 DIAGNOSIS — R278 Other lack of coordination: Secondary | ICD-10-CM | POA: Diagnosis not present

## 2015-11-29 DIAGNOSIS — R262 Difficulty in walking, not elsewhere classified: Secondary | ICD-10-CM | POA: Diagnosis not present

## 2015-11-29 DIAGNOSIS — R2681 Unsteadiness on feet: Secondary | ICD-10-CM | POA: Diagnosis not present

## 2015-11-30 DIAGNOSIS — M6281 Muscle weakness (generalized): Secondary | ICD-10-CM | POA: Diagnosis not present

## 2015-11-30 DIAGNOSIS — R262 Difficulty in walking, not elsewhere classified: Secondary | ICD-10-CM | POA: Diagnosis not present

## 2015-11-30 DIAGNOSIS — R2681 Unsteadiness on feet: Secondary | ICD-10-CM | POA: Diagnosis not present

## 2015-11-30 DIAGNOSIS — N393 Stress incontinence (female) (male): Secondary | ICD-10-CM | POA: Diagnosis not present

## 2015-11-30 DIAGNOSIS — R278 Other lack of coordination: Secondary | ICD-10-CM | POA: Diagnosis not present

## 2015-11-30 DIAGNOSIS — Z87440 Personal history of urinary (tract) infections: Secondary | ICD-10-CM | POA: Diagnosis not present

## 2015-12-01 DIAGNOSIS — R262 Difficulty in walking, not elsewhere classified: Secondary | ICD-10-CM | POA: Diagnosis not present

## 2015-12-01 DIAGNOSIS — N393 Stress incontinence (female) (male): Secondary | ICD-10-CM | POA: Diagnosis not present

## 2015-12-01 DIAGNOSIS — M6281 Muscle weakness (generalized): Secondary | ICD-10-CM | POA: Diagnosis not present

## 2015-12-01 DIAGNOSIS — R2681 Unsteadiness on feet: Secondary | ICD-10-CM | POA: Diagnosis not present

## 2015-12-01 DIAGNOSIS — R278 Other lack of coordination: Secondary | ICD-10-CM | POA: Diagnosis not present

## 2015-12-01 DIAGNOSIS — Z87440 Personal history of urinary (tract) infections: Secondary | ICD-10-CM | POA: Diagnosis not present

## 2015-12-04 DIAGNOSIS — R278 Other lack of coordination: Secondary | ICD-10-CM | POA: Diagnosis not present

## 2015-12-04 DIAGNOSIS — N393 Stress incontinence (female) (male): Secondary | ICD-10-CM | POA: Diagnosis not present

## 2015-12-04 DIAGNOSIS — M6281 Muscle weakness (generalized): Secondary | ICD-10-CM | POA: Diagnosis not present

## 2015-12-05 DIAGNOSIS — M6281 Muscle weakness (generalized): Secondary | ICD-10-CM | POA: Diagnosis not present

## 2015-12-05 DIAGNOSIS — N393 Stress incontinence (female) (male): Secondary | ICD-10-CM | POA: Diagnosis not present

## 2015-12-05 DIAGNOSIS — R278 Other lack of coordination: Secondary | ICD-10-CM | POA: Diagnosis not present

## 2015-12-06 DIAGNOSIS — Z6820 Body mass index (BMI) 20.0-20.9, adult: Secondary | ICD-10-CM | POA: Diagnosis not present

## 2015-12-06 DIAGNOSIS — D509 Iron deficiency anemia, unspecified: Secondary | ICD-10-CM | POA: Diagnosis not present

## 2015-12-06 DIAGNOSIS — R0609 Other forms of dyspnea: Secondary | ICD-10-CM | POA: Diagnosis not present

## 2015-12-06 DIAGNOSIS — L299 Pruritus, unspecified: Secondary | ICD-10-CM | POA: Diagnosis not present

## 2015-12-06 DIAGNOSIS — K5900 Constipation, unspecified: Secondary | ICD-10-CM | POA: Diagnosis not present

## 2015-12-07 DIAGNOSIS — N393 Stress incontinence (female) (male): Secondary | ICD-10-CM | POA: Diagnosis not present

## 2015-12-07 DIAGNOSIS — R278 Other lack of coordination: Secondary | ICD-10-CM | POA: Diagnosis not present

## 2015-12-07 DIAGNOSIS — M6281 Muscle weakness (generalized): Secondary | ICD-10-CM | POA: Diagnosis not present

## 2015-12-08 DIAGNOSIS — M6281 Muscle weakness (generalized): Secondary | ICD-10-CM | POA: Diagnosis not present

## 2015-12-08 DIAGNOSIS — N393 Stress incontinence (female) (male): Secondary | ICD-10-CM | POA: Diagnosis not present

## 2015-12-08 DIAGNOSIS — R278 Other lack of coordination: Secondary | ICD-10-CM | POA: Diagnosis not present

## 2015-12-12 DIAGNOSIS — M6281 Muscle weakness (generalized): Secondary | ICD-10-CM | POA: Diagnosis not present

## 2015-12-12 DIAGNOSIS — N393 Stress incontinence (female) (male): Secondary | ICD-10-CM | POA: Diagnosis not present

## 2015-12-12 DIAGNOSIS — R278 Other lack of coordination: Secondary | ICD-10-CM | POA: Diagnosis not present

## 2015-12-13 DIAGNOSIS — R278 Other lack of coordination: Secondary | ICD-10-CM | POA: Diagnosis not present

## 2015-12-13 DIAGNOSIS — N393 Stress incontinence (female) (male): Secondary | ICD-10-CM | POA: Diagnosis not present

## 2015-12-13 DIAGNOSIS — M6281 Muscle weakness (generalized): Secondary | ICD-10-CM | POA: Diagnosis not present

## 2015-12-14 DIAGNOSIS — N393 Stress incontinence (female) (male): Secondary | ICD-10-CM | POA: Diagnosis not present

## 2015-12-14 DIAGNOSIS — M6281 Muscle weakness (generalized): Secondary | ICD-10-CM | POA: Diagnosis not present

## 2015-12-14 DIAGNOSIS — R278 Other lack of coordination: Secondary | ICD-10-CM | POA: Diagnosis not present

## 2015-12-18 DIAGNOSIS — N393 Stress incontinence (female) (male): Secondary | ICD-10-CM | POA: Diagnosis not present

## 2015-12-18 DIAGNOSIS — R278 Other lack of coordination: Secondary | ICD-10-CM | POA: Diagnosis not present

## 2015-12-18 DIAGNOSIS — M6281 Muscle weakness (generalized): Secondary | ICD-10-CM | POA: Diagnosis not present

## 2015-12-19 DIAGNOSIS — M6281 Muscle weakness (generalized): Secondary | ICD-10-CM | POA: Diagnosis not present

## 2015-12-19 DIAGNOSIS — R278 Other lack of coordination: Secondary | ICD-10-CM | POA: Diagnosis not present

## 2015-12-19 DIAGNOSIS — N393 Stress incontinence (female) (male): Secondary | ICD-10-CM | POA: Diagnosis not present

## 2015-12-21 DIAGNOSIS — N393 Stress incontinence (female) (male): Secondary | ICD-10-CM | POA: Diagnosis not present

## 2015-12-21 DIAGNOSIS — R278 Other lack of coordination: Secondary | ICD-10-CM | POA: Diagnosis not present

## 2015-12-21 DIAGNOSIS — M6281 Muscle weakness (generalized): Secondary | ICD-10-CM | POA: Diagnosis not present

## 2015-12-27 DIAGNOSIS — M6281 Muscle weakness (generalized): Secondary | ICD-10-CM | POA: Diagnosis not present

## 2015-12-27 DIAGNOSIS — R278 Other lack of coordination: Secondary | ICD-10-CM | POA: Diagnosis not present

## 2015-12-27 DIAGNOSIS — N393 Stress incontinence (female) (male): Secondary | ICD-10-CM | POA: Diagnosis not present

## 2015-12-28 DIAGNOSIS — M6281 Muscle weakness (generalized): Secondary | ICD-10-CM | POA: Diagnosis not present

## 2015-12-28 DIAGNOSIS — N393 Stress incontinence (female) (male): Secondary | ICD-10-CM | POA: Diagnosis not present

## 2015-12-28 DIAGNOSIS — R278 Other lack of coordination: Secondary | ICD-10-CM | POA: Diagnosis not present

## 2016-01-02 DIAGNOSIS — R278 Other lack of coordination: Secondary | ICD-10-CM | POA: Diagnosis not present

## 2016-01-02 DIAGNOSIS — M6281 Muscle weakness (generalized): Secondary | ICD-10-CM | POA: Diagnosis not present

## 2016-01-02 DIAGNOSIS — N393 Stress incontinence (female) (male): Secondary | ICD-10-CM | POA: Diagnosis not present

## 2016-01-04 DIAGNOSIS — R278 Other lack of coordination: Secondary | ICD-10-CM | POA: Diagnosis not present

## 2016-01-04 DIAGNOSIS — M6281 Muscle weakness (generalized): Secondary | ICD-10-CM | POA: Diagnosis not present

## 2016-01-04 DIAGNOSIS — N393 Stress incontinence (female) (male): Secondary | ICD-10-CM | POA: Diagnosis not present

## 2016-01-05 DIAGNOSIS — Z23 Encounter for immunization: Secondary | ICD-10-CM | POA: Diagnosis not present

## 2016-01-09 DIAGNOSIS — N393 Stress incontinence (female) (male): Secondary | ICD-10-CM | POA: Diagnosis not present

## 2016-01-09 DIAGNOSIS — M6281 Muscle weakness (generalized): Secondary | ICD-10-CM | POA: Diagnosis not present

## 2016-01-09 DIAGNOSIS — R278 Other lack of coordination: Secondary | ICD-10-CM | POA: Diagnosis not present

## 2016-01-19 DIAGNOSIS — Z6820 Body mass index (BMI) 20.0-20.9, adult: Secondary | ICD-10-CM | POA: Diagnosis not present

## 2016-01-19 DIAGNOSIS — N183 Chronic kidney disease, stage 3 (moderate): Secondary | ICD-10-CM | POA: Diagnosis not present

## 2016-01-19 DIAGNOSIS — R413 Other amnesia: Secondary | ICD-10-CM | POA: Diagnosis not present

## 2016-01-19 DIAGNOSIS — D509 Iron deficiency anemia, unspecified: Secondary | ICD-10-CM | POA: Diagnosis not present

## 2016-01-19 DIAGNOSIS — R2689 Other abnormalities of gait and mobility: Secondary | ICD-10-CM | POA: Diagnosis not present

## 2016-01-19 DIAGNOSIS — D692 Other nonthrombocytopenic purpura: Secondary | ICD-10-CM | POA: Diagnosis not present

## 2016-01-19 DIAGNOSIS — Z79899 Other long term (current) drug therapy: Secondary | ICD-10-CM | POA: Diagnosis not present

## 2016-01-19 DIAGNOSIS — R0609 Other forms of dyspnea: Secondary | ICD-10-CM | POA: Diagnosis not present

## 2016-01-19 DIAGNOSIS — E038 Other specified hypothyroidism: Secondary | ICD-10-CM | POA: Diagnosis not present

## 2016-01-19 DIAGNOSIS — K4090 Unilateral inguinal hernia, without obstruction or gangrene, not specified as recurrent: Secondary | ICD-10-CM | POA: Diagnosis not present

## 2016-01-19 DIAGNOSIS — N401 Enlarged prostate with lower urinary tract symptoms: Secondary | ICD-10-CM | POA: Diagnosis not present

## 2016-01-19 DIAGNOSIS — I350 Nonrheumatic aortic (valve) stenosis: Secondary | ICD-10-CM | POA: Diagnosis not present

## 2016-03-17 DIAGNOSIS — N390 Urinary tract infection, site not specified: Secondary | ICD-10-CM | POA: Diagnosis not present

## 2016-03-17 DIAGNOSIS — R300 Dysuria: Secondary | ICD-10-CM | POA: Diagnosis not present

## 2016-03-17 DIAGNOSIS — A499 Bacterial infection, unspecified: Secondary | ICD-10-CM | POA: Diagnosis not present

## 2016-04-19 DIAGNOSIS — R2681 Unsteadiness on feet: Secondary | ICD-10-CM | POA: Diagnosis not present

## 2016-04-19 DIAGNOSIS — R262 Difficulty in walking, not elsewhere classified: Secondary | ICD-10-CM | POA: Diagnosis not present

## 2016-04-19 DIAGNOSIS — R278 Other lack of coordination: Secondary | ICD-10-CM | POA: Diagnosis not present

## 2016-04-19 DIAGNOSIS — M6281 Muscle weakness (generalized): Secondary | ICD-10-CM | POA: Diagnosis not present

## 2016-04-22 DIAGNOSIS — M6281 Muscle weakness (generalized): Secondary | ICD-10-CM | POA: Diagnosis not present

## 2016-04-22 DIAGNOSIS — R2681 Unsteadiness on feet: Secondary | ICD-10-CM | POA: Diagnosis not present

## 2016-04-22 DIAGNOSIS — R278 Other lack of coordination: Secondary | ICD-10-CM | POA: Diagnosis not present

## 2016-04-22 DIAGNOSIS — R262 Difficulty in walking, not elsewhere classified: Secondary | ICD-10-CM | POA: Diagnosis not present

## 2016-04-23 DIAGNOSIS — M6281 Muscle weakness (generalized): Secondary | ICD-10-CM | POA: Diagnosis not present

## 2016-04-23 DIAGNOSIS — R278 Other lack of coordination: Secondary | ICD-10-CM | POA: Diagnosis not present

## 2016-04-23 DIAGNOSIS — R2681 Unsteadiness on feet: Secondary | ICD-10-CM | POA: Diagnosis not present

## 2016-04-23 DIAGNOSIS — R262 Difficulty in walking, not elsewhere classified: Secondary | ICD-10-CM | POA: Diagnosis not present

## 2016-04-24 DIAGNOSIS — M6281 Muscle weakness (generalized): Secondary | ICD-10-CM | POA: Diagnosis not present

## 2016-04-24 DIAGNOSIS — R262 Difficulty in walking, not elsewhere classified: Secondary | ICD-10-CM | POA: Diagnosis not present

## 2016-04-24 DIAGNOSIS — R278 Other lack of coordination: Secondary | ICD-10-CM | POA: Diagnosis not present

## 2016-04-24 DIAGNOSIS — R2681 Unsteadiness on feet: Secondary | ICD-10-CM | POA: Diagnosis not present

## 2016-04-25 DIAGNOSIS — R262 Difficulty in walking, not elsewhere classified: Secondary | ICD-10-CM | POA: Diagnosis not present

## 2016-04-25 DIAGNOSIS — M6281 Muscle weakness (generalized): Secondary | ICD-10-CM | POA: Diagnosis not present

## 2016-04-25 DIAGNOSIS — R278 Other lack of coordination: Secondary | ICD-10-CM | POA: Diagnosis not present

## 2016-04-25 DIAGNOSIS — R2681 Unsteadiness on feet: Secondary | ICD-10-CM | POA: Diagnosis not present

## 2016-04-29 DIAGNOSIS — M6281 Muscle weakness (generalized): Secondary | ICD-10-CM | POA: Diagnosis not present

## 2016-04-29 DIAGNOSIS — D509 Iron deficiency anemia, unspecified: Secondary | ICD-10-CM | POA: Diagnosis not present

## 2016-04-29 DIAGNOSIS — I350 Nonrheumatic aortic (valve) stenosis: Secondary | ICD-10-CM | POA: Diagnosis not present

## 2016-04-29 DIAGNOSIS — Z1389 Encounter for screening for other disorder: Secondary | ICD-10-CM | POA: Diagnosis not present

## 2016-04-29 DIAGNOSIS — N183 Chronic kidney disease, stage 3 (moderate): Secondary | ICD-10-CM | POA: Diagnosis not present

## 2016-04-29 DIAGNOSIS — K4090 Unilateral inguinal hernia, without obstruction or gangrene, not specified as recurrent: Secondary | ICD-10-CM | POA: Diagnosis not present

## 2016-04-29 DIAGNOSIS — R413 Other amnesia: Secondary | ICD-10-CM | POA: Diagnosis not present

## 2016-04-29 DIAGNOSIS — Z6820 Body mass index (BMI) 20.0-20.9, adult: Secondary | ICD-10-CM | POA: Diagnosis not present

## 2016-04-29 DIAGNOSIS — R262 Difficulty in walking, not elsewhere classified: Secondary | ICD-10-CM | POA: Diagnosis not present

## 2016-04-29 DIAGNOSIS — R278 Other lack of coordination: Secondary | ICD-10-CM | POA: Diagnosis not present

## 2016-04-29 DIAGNOSIS — E038 Other specified hypothyroidism: Secondary | ICD-10-CM | POA: Diagnosis not present

## 2016-04-29 DIAGNOSIS — N401 Enlarged prostate with lower urinary tract symptoms: Secondary | ICD-10-CM | POA: Diagnosis not present

## 2016-04-29 DIAGNOSIS — R0609 Other forms of dyspnea: Secondary | ICD-10-CM | POA: Diagnosis not present

## 2016-04-29 DIAGNOSIS — R2689 Other abnormalities of gait and mobility: Secondary | ICD-10-CM | POA: Diagnosis not present

## 2016-04-29 DIAGNOSIS — D692 Other nonthrombocytopenic purpura: Secondary | ICD-10-CM | POA: Diagnosis not present

## 2016-04-29 DIAGNOSIS — R2681 Unsteadiness on feet: Secondary | ICD-10-CM | POA: Diagnosis not present

## 2016-04-30 DIAGNOSIS — M6281 Muscle weakness (generalized): Secondary | ICD-10-CM | POA: Diagnosis not present

## 2016-04-30 DIAGNOSIS — R262 Difficulty in walking, not elsewhere classified: Secondary | ICD-10-CM | POA: Diagnosis not present

## 2016-04-30 DIAGNOSIS — R2681 Unsteadiness on feet: Secondary | ICD-10-CM | POA: Diagnosis not present

## 2016-04-30 DIAGNOSIS — R278 Other lack of coordination: Secondary | ICD-10-CM | POA: Diagnosis not present

## 2016-05-02 DIAGNOSIS — M6281 Muscle weakness (generalized): Secondary | ICD-10-CM | POA: Diagnosis not present

## 2016-05-02 DIAGNOSIS — R278 Other lack of coordination: Secondary | ICD-10-CM | POA: Diagnosis not present

## 2016-05-02 DIAGNOSIS — R2681 Unsteadiness on feet: Secondary | ICD-10-CM | POA: Diagnosis not present

## 2016-05-02 DIAGNOSIS — R262 Difficulty in walking, not elsewhere classified: Secondary | ICD-10-CM | POA: Diagnosis not present

## 2016-05-03 DIAGNOSIS — R2681 Unsteadiness on feet: Secondary | ICD-10-CM | POA: Diagnosis not present

## 2016-05-03 DIAGNOSIS — R262 Difficulty in walking, not elsewhere classified: Secondary | ICD-10-CM | POA: Diagnosis not present

## 2016-05-03 DIAGNOSIS — M6281 Muscle weakness (generalized): Secondary | ICD-10-CM | POA: Diagnosis not present

## 2016-05-03 DIAGNOSIS — R278 Other lack of coordination: Secondary | ICD-10-CM | POA: Diagnosis not present

## 2016-05-06 DIAGNOSIS — R262 Difficulty in walking, not elsewhere classified: Secondary | ICD-10-CM | POA: Diagnosis not present

## 2016-05-06 DIAGNOSIS — R278 Other lack of coordination: Secondary | ICD-10-CM | POA: Diagnosis not present

## 2016-05-06 DIAGNOSIS — M6281 Muscle weakness (generalized): Secondary | ICD-10-CM | POA: Diagnosis not present

## 2016-05-06 DIAGNOSIS — R2681 Unsteadiness on feet: Secondary | ICD-10-CM | POA: Diagnosis not present

## 2016-05-07 DIAGNOSIS — M6281 Muscle weakness (generalized): Secondary | ICD-10-CM | POA: Diagnosis not present

## 2016-05-07 DIAGNOSIS — R262 Difficulty in walking, not elsewhere classified: Secondary | ICD-10-CM | POA: Diagnosis not present

## 2016-05-07 DIAGNOSIS — R278 Other lack of coordination: Secondary | ICD-10-CM | POA: Diagnosis not present

## 2016-05-07 DIAGNOSIS — R2681 Unsteadiness on feet: Secondary | ICD-10-CM | POA: Diagnosis not present

## 2016-05-09 DIAGNOSIS — R2681 Unsteadiness on feet: Secondary | ICD-10-CM | POA: Diagnosis not present

## 2016-05-09 DIAGNOSIS — R278 Other lack of coordination: Secondary | ICD-10-CM | POA: Diagnosis not present

## 2016-05-09 DIAGNOSIS — R262 Difficulty in walking, not elsewhere classified: Secondary | ICD-10-CM | POA: Diagnosis not present

## 2016-05-09 DIAGNOSIS — M6281 Muscle weakness (generalized): Secondary | ICD-10-CM | POA: Diagnosis not present

## 2016-05-13 DIAGNOSIS — M6281 Muscle weakness (generalized): Secondary | ICD-10-CM | POA: Diagnosis not present

## 2016-05-13 DIAGNOSIS — R278 Other lack of coordination: Secondary | ICD-10-CM | POA: Diagnosis not present

## 2016-05-13 DIAGNOSIS — R262 Difficulty in walking, not elsewhere classified: Secondary | ICD-10-CM | POA: Diagnosis not present

## 2016-05-13 DIAGNOSIS — R2681 Unsteadiness on feet: Secondary | ICD-10-CM | POA: Diagnosis not present

## 2016-05-14 DIAGNOSIS — R262 Difficulty in walking, not elsewhere classified: Secondary | ICD-10-CM | POA: Diagnosis not present

## 2016-05-14 DIAGNOSIS — M6281 Muscle weakness (generalized): Secondary | ICD-10-CM | POA: Diagnosis not present

## 2016-05-14 DIAGNOSIS — R278 Other lack of coordination: Secondary | ICD-10-CM | POA: Diagnosis not present

## 2016-05-14 DIAGNOSIS — R2681 Unsteadiness on feet: Secondary | ICD-10-CM | POA: Diagnosis not present

## 2016-05-15 DIAGNOSIS — R278 Other lack of coordination: Secondary | ICD-10-CM | POA: Diagnosis not present

## 2016-05-15 DIAGNOSIS — R2681 Unsteadiness on feet: Secondary | ICD-10-CM | POA: Diagnosis not present

## 2016-05-15 DIAGNOSIS — R262 Difficulty in walking, not elsewhere classified: Secondary | ICD-10-CM | POA: Diagnosis not present

## 2016-05-15 DIAGNOSIS — M6281 Muscle weakness (generalized): Secondary | ICD-10-CM | POA: Diagnosis not present

## 2016-05-16 DIAGNOSIS — M6281 Muscle weakness (generalized): Secondary | ICD-10-CM | POA: Diagnosis not present

## 2016-05-16 DIAGNOSIS — R262 Difficulty in walking, not elsewhere classified: Secondary | ICD-10-CM | POA: Diagnosis not present

## 2016-05-16 DIAGNOSIS — R2681 Unsteadiness on feet: Secondary | ICD-10-CM | POA: Diagnosis not present

## 2016-05-16 DIAGNOSIS — R278 Other lack of coordination: Secondary | ICD-10-CM | POA: Diagnosis not present

## 2016-05-17 DIAGNOSIS — R262 Difficulty in walking, not elsewhere classified: Secondary | ICD-10-CM | POA: Diagnosis not present

## 2016-05-17 DIAGNOSIS — R278 Other lack of coordination: Secondary | ICD-10-CM | POA: Diagnosis not present

## 2016-05-17 DIAGNOSIS — R2681 Unsteadiness on feet: Secondary | ICD-10-CM | POA: Diagnosis not present

## 2016-05-17 DIAGNOSIS — M6281 Muscle weakness (generalized): Secondary | ICD-10-CM | POA: Diagnosis not present

## 2016-05-20 DIAGNOSIS — M6281 Muscle weakness (generalized): Secondary | ICD-10-CM | POA: Diagnosis not present

## 2016-05-20 DIAGNOSIS — R262 Difficulty in walking, not elsewhere classified: Secondary | ICD-10-CM | POA: Diagnosis not present

## 2016-05-20 DIAGNOSIS — R2681 Unsteadiness on feet: Secondary | ICD-10-CM | POA: Diagnosis not present

## 2016-05-20 DIAGNOSIS — R278 Other lack of coordination: Secondary | ICD-10-CM | POA: Diagnosis not present

## 2016-05-21 DIAGNOSIS — R262 Difficulty in walking, not elsewhere classified: Secondary | ICD-10-CM | POA: Diagnosis not present

## 2016-05-21 DIAGNOSIS — R2681 Unsteadiness on feet: Secondary | ICD-10-CM | POA: Diagnosis not present

## 2016-05-21 DIAGNOSIS — M6281 Muscle weakness (generalized): Secondary | ICD-10-CM | POA: Diagnosis not present

## 2016-05-21 DIAGNOSIS — R278 Other lack of coordination: Secondary | ICD-10-CM | POA: Diagnosis not present

## 2016-05-24 DIAGNOSIS — M6281 Muscle weakness (generalized): Secondary | ICD-10-CM | POA: Diagnosis not present

## 2016-05-24 DIAGNOSIS — R262 Difficulty in walking, not elsewhere classified: Secondary | ICD-10-CM | POA: Diagnosis not present

## 2016-05-24 DIAGNOSIS — R2681 Unsteadiness on feet: Secondary | ICD-10-CM | POA: Diagnosis not present

## 2016-05-24 DIAGNOSIS — R278 Other lack of coordination: Secondary | ICD-10-CM | POA: Diagnosis not present

## 2016-05-27 DIAGNOSIS — M6281 Muscle weakness (generalized): Secondary | ICD-10-CM | POA: Diagnosis not present

## 2016-05-27 DIAGNOSIS — R2681 Unsteadiness on feet: Secondary | ICD-10-CM | POA: Diagnosis not present

## 2016-05-27 DIAGNOSIS — R262 Difficulty in walking, not elsewhere classified: Secondary | ICD-10-CM | POA: Diagnosis not present

## 2016-05-27 DIAGNOSIS — R278 Other lack of coordination: Secondary | ICD-10-CM | POA: Diagnosis not present

## 2016-05-28 DIAGNOSIS — R278 Other lack of coordination: Secondary | ICD-10-CM | POA: Diagnosis not present

## 2016-05-28 DIAGNOSIS — R262 Difficulty in walking, not elsewhere classified: Secondary | ICD-10-CM | POA: Diagnosis not present

## 2016-05-28 DIAGNOSIS — R2681 Unsteadiness on feet: Secondary | ICD-10-CM | POA: Diagnosis not present

## 2016-05-28 DIAGNOSIS — M6281 Muscle weakness (generalized): Secondary | ICD-10-CM | POA: Diagnosis not present

## 2016-05-29 DIAGNOSIS — R278 Other lack of coordination: Secondary | ICD-10-CM | POA: Diagnosis not present

## 2016-05-29 DIAGNOSIS — R262 Difficulty in walking, not elsewhere classified: Secondary | ICD-10-CM | POA: Diagnosis not present

## 2016-05-29 DIAGNOSIS — M6281 Muscle weakness (generalized): Secondary | ICD-10-CM | POA: Diagnosis not present

## 2016-05-29 DIAGNOSIS — R2681 Unsteadiness on feet: Secondary | ICD-10-CM | POA: Diagnosis not present

## 2016-05-30 DIAGNOSIS — R2681 Unsteadiness on feet: Secondary | ICD-10-CM | POA: Diagnosis not present

## 2016-05-30 DIAGNOSIS — M6281 Muscle weakness (generalized): Secondary | ICD-10-CM | POA: Diagnosis not present

## 2016-05-30 DIAGNOSIS — R262 Difficulty in walking, not elsewhere classified: Secondary | ICD-10-CM | POA: Diagnosis not present

## 2016-05-30 DIAGNOSIS — R278 Other lack of coordination: Secondary | ICD-10-CM | POA: Diagnosis not present

## 2016-06-03 DIAGNOSIS — R262 Difficulty in walking, not elsewhere classified: Secondary | ICD-10-CM | POA: Diagnosis not present

## 2016-06-03 DIAGNOSIS — M6281 Muscle weakness (generalized): Secondary | ICD-10-CM | POA: Diagnosis not present

## 2016-06-03 DIAGNOSIS — R278 Other lack of coordination: Secondary | ICD-10-CM | POA: Diagnosis not present

## 2016-06-03 DIAGNOSIS — R2681 Unsteadiness on feet: Secondary | ICD-10-CM | POA: Diagnosis not present

## 2016-06-05 DIAGNOSIS — R278 Other lack of coordination: Secondary | ICD-10-CM | POA: Diagnosis not present

## 2016-06-05 DIAGNOSIS — R262 Difficulty in walking, not elsewhere classified: Secondary | ICD-10-CM | POA: Diagnosis not present

## 2016-06-05 DIAGNOSIS — M6281 Muscle weakness (generalized): Secondary | ICD-10-CM | POA: Diagnosis not present

## 2016-06-05 DIAGNOSIS — R2681 Unsteadiness on feet: Secondary | ICD-10-CM | POA: Diagnosis not present

## 2016-06-06 DIAGNOSIS — R2681 Unsteadiness on feet: Secondary | ICD-10-CM | POA: Diagnosis not present

## 2016-06-06 DIAGNOSIS — M6281 Muscle weakness (generalized): Secondary | ICD-10-CM | POA: Diagnosis not present

## 2016-06-06 DIAGNOSIS — R262 Difficulty in walking, not elsewhere classified: Secondary | ICD-10-CM | POA: Diagnosis not present

## 2016-06-06 DIAGNOSIS — R278 Other lack of coordination: Secondary | ICD-10-CM | POA: Diagnosis not present

## 2016-06-07 DIAGNOSIS — R2681 Unsteadiness on feet: Secondary | ICD-10-CM | POA: Diagnosis not present

## 2016-06-07 DIAGNOSIS — M6281 Muscle weakness (generalized): Secondary | ICD-10-CM | POA: Diagnosis not present

## 2016-06-07 DIAGNOSIS — R278 Other lack of coordination: Secondary | ICD-10-CM | POA: Diagnosis not present

## 2016-06-07 DIAGNOSIS — R262 Difficulty in walking, not elsewhere classified: Secondary | ICD-10-CM | POA: Diagnosis not present

## 2016-06-10 DIAGNOSIS — R262 Difficulty in walking, not elsewhere classified: Secondary | ICD-10-CM | POA: Diagnosis not present

## 2016-06-10 DIAGNOSIS — M6281 Muscle weakness (generalized): Secondary | ICD-10-CM | POA: Diagnosis not present

## 2016-06-10 DIAGNOSIS — R2681 Unsteadiness on feet: Secondary | ICD-10-CM | POA: Diagnosis not present

## 2016-06-10 DIAGNOSIS — R278 Other lack of coordination: Secondary | ICD-10-CM | POA: Diagnosis not present

## 2016-06-12 DIAGNOSIS — M6281 Muscle weakness (generalized): Secondary | ICD-10-CM | POA: Diagnosis not present

## 2016-06-12 DIAGNOSIS — R278 Other lack of coordination: Secondary | ICD-10-CM | POA: Diagnosis not present

## 2016-06-12 DIAGNOSIS — R262 Difficulty in walking, not elsewhere classified: Secondary | ICD-10-CM | POA: Diagnosis not present

## 2016-06-12 DIAGNOSIS — R2681 Unsteadiness on feet: Secondary | ICD-10-CM | POA: Diagnosis not present

## 2016-06-13 DIAGNOSIS — M6281 Muscle weakness (generalized): Secondary | ICD-10-CM | POA: Diagnosis not present

## 2016-06-13 DIAGNOSIS — R278 Other lack of coordination: Secondary | ICD-10-CM | POA: Diagnosis not present

## 2016-06-13 DIAGNOSIS — R262 Difficulty in walking, not elsewhere classified: Secondary | ICD-10-CM | POA: Diagnosis not present

## 2016-06-13 DIAGNOSIS — R2681 Unsteadiness on feet: Secondary | ICD-10-CM | POA: Diagnosis not present

## 2016-06-17 DIAGNOSIS — M6281 Muscle weakness (generalized): Secondary | ICD-10-CM | POA: Diagnosis not present

## 2016-06-17 DIAGNOSIS — R262 Difficulty in walking, not elsewhere classified: Secondary | ICD-10-CM | POA: Diagnosis not present

## 2016-06-17 DIAGNOSIS — R2681 Unsteadiness on feet: Secondary | ICD-10-CM | POA: Diagnosis not present

## 2016-06-17 DIAGNOSIS — R278 Other lack of coordination: Secondary | ICD-10-CM | POA: Diagnosis not present

## 2016-06-19 DIAGNOSIS — R262 Difficulty in walking, not elsewhere classified: Secondary | ICD-10-CM | POA: Diagnosis not present

## 2016-06-19 DIAGNOSIS — R278 Other lack of coordination: Secondary | ICD-10-CM | POA: Diagnosis not present

## 2016-06-19 DIAGNOSIS — M6281 Muscle weakness (generalized): Secondary | ICD-10-CM | POA: Diagnosis not present

## 2016-06-19 DIAGNOSIS — R2681 Unsteadiness on feet: Secondary | ICD-10-CM | POA: Diagnosis not present

## 2016-06-21 DIAGNOSIS — R278 Other lack of coordination: Secondary | ICD-10-CM | POA: Diagnosis not present

## 2016-06-21 DIAGNOSIS — R262 Difficulty in walking, not elsewhere classified: Secondary | ICD-10-CM | POA: Diagnosis not present

## 2016-06-21 DIAGNOSIS — M6281 Muscle weakness (generalized): Secondary | ICD-10-CM | POA: Diagnosis not present

## 2016-06-21 DIAGNOSIS — R2681 Unsteadiness on feet: Secondary | ICD-10-CM | POA: Diagnosis not present

## 2016-06-25 DIAGNOSIS — M6281 Muscle weakness (generalized): Secondary | ICD-10-CM | POA: Diagnosis not present

## 2016-06-25 DIAGNOSIS — R262 Difficulty in walking, not elsewhere classified: Secondary | ICD-10-CM | POA: Diagnosis not present

## 2016-06-25 DIAGNOSIS — R278 Other lack of coordination: Secondary | ICD-10-CM | POA: Diagnosis not present

## 2016-06-25 DIAGNOSIS — R2681 Unsteadiness on feet: Secondary | ICD-10-CM | POA: Diagnosis not present

## 2016-06-26 DIAGNOSIS — R2681 Unsteadiness on feet: Secondary | ICD-10-CM | POA: Diagnosis not present

## 2016-06-26 DIAGNOSIS — M6281 Muscle weakness (generalized): Secondary | ICD-10-CM | POA: Diagnosis not present

## 2016-06-26 DIAGNOSIS — R278 Other lack of coordination: Secondary | ICD-10-CM | POA: Diagnosis not present

## 2016-06-26 DIAGNOSIS — R262 Difficulty in walking, not elsewhere classified: Secondary | ICD-10-CM | POA: Diagnosis not present

## 2016-06-27 DIAGNOSIS — M6281 Muscle weakness (generalized): Secondary | ICD-10-CM | POA: Diagnosis not present

## 2016-06-27 DIAGNOSIS — R262 Difficulty in walking, not elsewhere classified: Secondary | ICD-10-CM | POA: Diagnosis not present

## 2016-06-27 DIAGNOSIS — R278 Other lack of coordination: Secondary | ICD-10-CM | POA: Diagnosis not present

## 2016-06-27 DIAGNOSIS — R2681 Unsteadiness on feet: Secondary | ICD-10-CM | POA: Diagnosis not present

## 2016-07-02 DIAGNOSIS — R41841 Cognitive communication deficit: Secondary | ICD-10-CM | POA: Diagnosis not present

## 2016-07-04 DIAGNOSIS — R41841 Cognitive communication deficit: Secondary | ICD-10-CM | POA: Diagnosis not present

## 2016-07-09 DIAGNOSIS — R41841 Cognitive communication deficit: Secondary | ICD-10-CM | POA: Diagnosis not present

## 2016-07-11 DIAGNOSIS — R41841 Cognitive communication deficit: Secondary | ICD-10-CM | POA: Diagnosis not present

## 2016-07-23 DIAGNOSIS — R41841 Cognitive communication deficit: Secondary | ICD-10-CM | POA: Diagnosis not present

## 2016-07-25 DIAGNOSIS — R300 Dysuria: Secondary | ICD-10-CM | POA: Diagnosis not present

## 2016-07-25 DIAGNOSIS — N390 Urinary tract infection, site not specified: Secondary | ICD-10-CM | POA: Diagnosis not present

## 2016-07-25 DIAGNOSIS — R41841 Cognitive communication deficit: Secondary | ICD-10-CM | POA: Diagnosis not present

## 2016-07-30 DIAGNOSIS — R41841 Cognitive communication deficit: Secondary | ICD-10-CM | POA: Diagnosis not present

## 2016-07-31 DIAGNOSIS — N401 Enlarged prostate with lower urinary tract symptoms: Secondary | ICD-10-CM | POA: Diagnosis not present

## 2016-07-31 DIAGNOSIS — R310 Gross hematuria: Secondary | ICD-10-CM | POA: Diagnosis not present

## 2016-07-31 DIAGNOSIS — R351 Nocturia: Secondary | ICD-10-CM | POA: Diagnosis not present

## 2016-08-01 DIAGNOSIS — R41841 Cognitive communication deficit: Secondary | ICD-10-CM | POA: Diagnosis not present

## 2016-08-06 DIAGNOSIS — R41841 Cognitive communication deficit: Secondary | ICD-10-CM | POA: Diagnosis not present

## 2016-08-08 DIAGNOSIS — R41841 Cognitive communication deficit: Secondary | ICD-10-CM | POA: Diagnosis not present

## 2016-08-13 DIAGNOSIS — R41841 Cognitive communication deficit: Secondary | ICD-10-CM | POA: Diagnosis not present

## 2016-08-15 DIAGNOSIS — R41841 Cognitive communication deficit: Secondary | ICD-10-CM | POA: Diagnosis not present

## 2016-08-20 DIAGNOSIS — R41841 Cognitive communication deficit: Secondary | ICD-10-CM | POA: Diagnosis not present

## 2016-08-22 DIAGNOSIS — R41841 Cognitive communication deficit: Secondary | ICD-10-CM | POA: Diagnosis not present

## 2016-08-25 DIAGNOSIS — R41841 Cognitive communication deficit: Secondary | ICD-10-CM | POA: Diagnosis not present

## 2016-08-27 DIAGNOSIS — R41841 Cognitive communication deficit: Secondary | ICD-10-CM | POA: Diagnosis not present

## 2016-09-05 DIAGNOSIS — R41841 Cognitive communication deficit: Secondary | ICD-10-CM | POA: Diagnosis not present

## 2016-09-06 DIAGNOSIS — R41841 Cognitive communication deficit: Secondary | ICD-10-CM | POA: Diagnosis not present

## 2016-09-10 DIAGNOSIS — R41841 Cognitive communication deficit: Secondary | ICD-10-CM | POA: Diagnosis not present

## 2016-09-12 DIAGNOSIS — R41841 Cognitive communication deficit: Secondary | ICD-10-CM | POA: Diagnosis not present

## 2016-09-13 DIAGNOSIS — D509 Iron deficiency anemia, unspecified: Secondary | ICD-10-CM | POA: Diagnosis not present

## 2016-09-13 DIAGNOSIS — N183 Chronic kidney disease, stage 3 (moderate): Secondary | ICD-10-CM | POA: Diagnosis not present

## 2016-09-19 DIAGNOSIS — R300 Dysuria: Secondary | ICD-10-CM | POA: Diagnosis not present

## 2016-09-19 DIAGNOSIS — N390 Urinary tract infection, site not specified: Secondary | ICD-10-CM | POA: Diagnosis not present

## 2016-10-21 DIAGNOSIS — R278 Other lack of coordination: Secondary | ICD-10-CM | POA: Diagnosis not present

## 2016-10-21 DIAGNOSIS — R41841 Cognitive communication deficit: Secondary | ICD-10-CM | POA: Diagnosis not present

## 2016-10-21 DIAGNOSIS — N393 Stress incontinence (female) (male): Secondary | ICD-10-CM | POA: Diagnosis not present

## 2016-10-21 DIAGNOSIS — M6281 Muscle weakness (generalized): Secondary | ICD-10-CM | POA: Diagnosis not present

## 2016-10-21 DIAGNOSIS — R2681 Unsteadiness on feet: Secondary | ICD-10-CM | POA: Diagnosis not present

## 2016-10-22 DIAGNOSIS — R2681 Unsteadiness on feet: Secondary | ICD-10-CM | POA: Diagnosis not present

## 2016-10-22 DIAGNOSIS — M6281 Muscle weakness (generalized): Secondary | ICD-10-CM | POA: Diagnosis not present

## 2016-10-22 DIAGNOSIS — R278 Other lack of coordination: Secondary | ICD-10-CM | POA: Diagnosis not present

## 2016-10-22 DIAGNOSIS — R41841 Cognitive communication deficit: Secondary | ICD-10-CM | POA: Diagnosis not present

## 2016-10-22 DIAGNOSIS — N393 Stress incontinence (female) (male): Secondary | ICD-10-CM | POA: Diagnosis not present

## 2016-10-23 DIAGNOSIS — M6281 Muscle weakness (generalized): Secondary | ICD-10-CM | POA: Diagnosis not present

## 2016-10-23 DIAGNOSIS — E038 Other specified hypothyroidism: Secondary | ICD-10-CM | POA: Diagnosis not present

## 2016-10-23 DIAGNOSIS — Z79899 Other long term (current) drug therapy: Secondary | ICD-10-CM | POA: Diagnosis not present

## 2016-10-23 DIAGNOSIS — N393 Stress incontinence (female) (male): Secondary | ICD-10-CM | POA: Diagnosis not present

## 2016-10-23 DIAGNOSIS — R278 Other lack of coordination: Secondary | ICD-10-CM | POA: Diagnosis not present

## 2016-10-23 DIAGNOSIS — R41841 Cognitive communication deficit: Secondary | ICD-10-CM | POA: Diagnosis not present

## 2016-10-23 DIAGNOSIS — Z125 Encounter for screening for malignant neoplasm of prostate: Secondary | ICD-10-CM | POA: Diagnosis not present

## 2016-10-23 DIAGNOSIS — R2681 Unsteadiness on feet: Secondary | ICD-10-CM | POA: Diagnosis not present

## 2016-10-24 DIAGNOSIS — N393 Stress incontinence (female) (male): Secondary | ICD-10-CM | POA: Diagnosis not present

## 2016-10-24 DIAGNOSIS — R41841 Cognitive communication deficit: Secondary | ICD-10-CM | POA: Diagnosis not present

## 2016-10-24 DIAGNOSIS — M6281 Muscle weakness (generalized): Secondary | ICD-10-CM | POA: Diagnosis not present

## 2016-10-24 DIAGNOSIS — R2681 Unsteadiness on feet: Secondary | ICD-10-CM | POA: Diagnosis not present

## 2016-10-24 DIAGNOSIS — R278 Other lack of coordination: Secondary | ICD-10-CM | POA: Diagnosis not present

## 2016-10-29 DIAGNOSIS — N393 Stress incontinence (female) (male): Secondary | ICD-10-CM | POA: Diagnosis not present

## 2016-10-29 DIAGNOSIS — R278 Other lack of coordination: Secondary | ICD-10-CM | POA: Diagnosis not present

## 2016-10-29 DIAGNOSIS — M6281 Muscle weakness (generalized): Secondary | ICD-10-CM | POA: Diagnosis not present

## 2016-10-29 DIAGNOSIS — R2681 Unsteadiness on feet: Secondary | ICD-10-CM | POA: Diagnosis not present

## 2016-10-29 DIAGNOSIS — R41841 Cognitive communication deficit: Secondary | ICD-10-CM | POA: Diagnosis not present

## 2016-10-30 DIAGNOSIS — M1990 Unspecified osteoarthritis, unspecified site: Secondary | ICD-10-CM | POA: Diagnosis not present

## 2016-10-30 DIAGNOSIS — N393 Stress incontinence (female) (male): Secondary | ICD-10-CM | POA: Diagnosis not present

## 2016-10-30 DIAGNOSIS — E038 Other specified hypothyroidism: Secondary | ICD-10-CM | POA: Diagnosis not present

## 2016-10-30 DIAGNOSIS — Z0000 Encounter for general adult medical examination without abnormal findings: Secondary | ICD-10-CM | POA: Diagnosis not present

## 2016-10-30 DIAGNOSIS — D692 Other nonthrombocytopenic purpura: Secondary | ICD-10-CM | POA: Diagnosis not present

## 2016-10-30 DIAGNOSIS — I350 Nonrheumatic aortic (valve) stenosis: Secondary | ICD-10-CM | POA: Diagnosis not present

## 2016-10-30 DIAGNOSIS — R41841 Cognitive communication deficit: Secondary | ICD-10-CM | POA: Diagnosis not present

## 2016-10-30 DIAGNOSIS — R2681 Unsteadiness on feet: Secondary | ICD-10-CM | POA: Diagnosis not present

## 2016-10-30 DIAGNOSIS — N183 Chronic kidney disease, stage 3 (moderate): Secondary | ICD-10-CM | POA: Diagnosis not present

## 2016-10-30 DIAGNOSIS — R413 Other amnesia: Secondary | ICD-10-CM | POA: Diagnosis not present

## 2016-10-30 DIAGNOSIS — Z681 Body mass index (BMI) 19 or less, adult: Secondary | ICD-10-CM | POA: Diagnosis not present

## 2016-10-30 DIAGNOSIS — R278 Other lack of coordination: Secondary | ICD-10-CM | POA: Diagnosis not present

## 2016-10-30 DIAGNOSIS — R2689 Other abnormalities of gait and mobility: Secondary | ICD-10-CM | POA: Diagnosis not present

## 2016-10-30 DIAGNOSIS — M6281 Muscle weakness (generalized): Secondary | ICD-10-CM | POA: Diagnosis not present

## 2016-10-30 DIAGNOSIS — D509 Iron deficiency anemia, unspecified: Secondary | ICD-10-CM | POA: Diagnosis not present

## 2016-10-30 DIAGNOSIS — K4090 Unilateral inguinal hernia, without obstruction or gangrene, not specified as recurrent: Secondary | ICD-10-CM | POA: Diagnosis not present

## 2016-10-30 DIAGNOSIS — I482 Chronic atrial fibrillation: Secondary | ICD-10-CM | POA: Diagnosis not present

## 2016-10-31 DIAGNOSIS — N393 Stress incontinence (female) (male): Secondary | ICD-10-CM | POA: Diagnosis not present

## 2016-10-31 DIAGNOSIS — M6281 Muscle weakness (generalized): Secondary | ICD-10-CM | POA: Diagnosis not present

## 2016-10-31 DIAGNOSIS — R41841 Cognitive communication deficit: Secondary | ICD-10-CM | POA: Diagnosis not present

## 2016-10-31 DIAGNOSIS — R278 Other lack of coordination: Secondary | ICD-10-CM | POA: Diagnosis not present

## 2016-10-31 DIAGNOSIS — R2681 Unsteadiness on feet: Secondary | ICD-10-CM | POA: Diagnosis not present

## 2016-11-01 DIAGNOSIS — N393 Stress incontinence (female) (male): Secondary | ICD-10-CM | POA: Diagnosis not present

## 2016-11-01 DIAGNOSIS — R41841 Cognitive communication deficit: Secondary | ICD-10-CM | POA: Diagnosis not present

## 2016-11-01 DIAGNOSIS — M6281 Muscle weakness (generalized): Secondary | ICD-10-CM | POA: Diagnosis not present

## 2016-11-01 DIAGNOSIS — R2681 Unsteadiness on feet: Secondary | ICD-10-CM | POA: Diagnosis not present

## 2016-11-01 DIAGNOSIS — R278 Other lack of coordination: Secondary | ICD-10-CM | POA: Diagnosis not present

## 2016-11-01 DIAGNOSIS — Z23 Encounter for immunization: Secondary | ICD-10-CM | POA: Diagnosis not present

## 2016-11-05 DIAGNOSIS — N393 Stress incontinence (female) (male): Secondary | ICD-10-CM | POA: Diagnosis not present

## 2016-11-05 DIAGNOSIS — R41841 Cognitive communication deficit: Secondary | ICD-10-CM | POA: Diagnosis not present

## 2016-11-05 DIAGNOSIS — M6281 Muscle weakness (generalized): Secondary | ICD-10-CM | POA: Diagnosis not present

## 2016-11-05 DIAGNOSIS — R2681 Unsteadiness on feet: Secondary | ICD-10-CM | POA: Diagnosis not present

## 2016-11-05 DIAGNOSIS — R278 Other lack of coordination: Secondary | ICD-10-CM | POA: Diagnosis not present

## 2016-11-07 DIAGNOSIS — R2681 Unsteadiness on feet: Secondary | ICD-10-CM | POA: Diagnosis not present

## 2016-11-07 DIAGNOSIS — N393 Stress incontinence (female) (male): Secondary | ICD-10-CM | POA: Diagnosis not present

## 2016-11-07 DIAGNOSIS — M6281 Muscle weakness (generalized): Secondary | ICD-10-CM | POA: Diagnosis not present

## 2016-11-07 DIAGNOSIS — R41841 Cognitive communication deficit: Secondary | ICD-10-CM | POA: Diagnosis not present

## 2016-11-07 DIAGNOSIS — R278 Other lack of coordination: Secondary | ICD-10-CM | POA: Diagnosis not present

## 2016-11-11 DIAGNOSIS — R2681 Unsteadiness on feet: Secondary | ICD-10-CM | POA: Diagnosis not present

## 2016-11-11 DIAGNOSIS — M6281 Muscle weakness (generalized): Secondary | ICD-10-CM | POA: Diagnosis not present

## 2016-11-11 DIAGNOSIS — R41841 Cognitive communication deficit: Secondary | ICD-10-CM | POA: Diagnosis not present

## 2016-11-11 DIAGNOSIS — N393 Stress incontinence (female) (male): Secondary | ICD-10-CM | POA: Diagnosis not present

## 2016-11-11 DIAGNOSIS — R278 Other lack of coordination: Secondary | ICD-10-CM | POA: Diagnosis not present

## 2016-11-14 DIAGNOSIS — N393 Stress incontinence (female) (male): Secondary | ICD-10-CM | POA: Diagnosis not present

## 2016-11-14 DIAGNOSIS — R41841 Cognitive communication deficit: Secondary | ICD-10-CM | POA: Diagnosis not present

## 2016-11-14 DIAGNOSIS — M6281 Muscle weakness (generalized): Secondary | ICD-10-CM | POA: Diagnosis not present

## 2016-11-14 DIAGNOSIS — R278 Other lack of coordination: Secondary | ICD-10-CM | POA: Diagnosis not present

## 2016-11-14 DIAGNOSIS — R2681 Unsteadiness on feet: Secondary | ICD-10-CM | POA: Diagnosis not present

## 2016-11-19 DIAGNOSIS — N393 Stress incontinence (female) (male): Secondary | ICD-10-CM | POA: Diagnosis not present

## 2016-11-19 DIAGNOSIS — R2681 Unsteadiness on feet: Secondary | ICD-10-CM | POA: Diagnosis not present

## 2016-11-19 DIAGNOSIS — R278 Other lack of coordination: Secondary | ICD-10-CM | POA: Diagnosis not present

## 2016-11-19 DIAGNOSIS — R41841 Cognitive communication deficit: Secondary | ICD-10-CM | POA: Diagnosis not present

## 2016-11-19 DIAGNOSIS — M6281 Muscle weakness (generalized): Secondary | ICD-10-CM | POA: Diagnosis not present

## 2016-11-20 DIAGNOSIS — N393 Stress incontinence (female) (male): Secondary | ICD-10-CM | POA: Diagnosis not present

## 2016-11-20 DIAGNOSIS — M6281 Muscle weakness (generalized): Secondary | ICD-10-CM | POA: Diagnosis not present

## 2016-11-20 DIAGNOSIS — R2681 Unsteadiness on feet: Secondary | ICD-10-CM | POA: Diagnosis not present

## 2016-11-20 DIAGNOSIS — R41841 Cognitive communication deficit: Secondary | ICD-10-CM | POA: Diagnosis not present

## 2016-11-20 DIAGNOSIS — R278 Other lack of coordination: Secondary | ICD-10-CM | POA: Diagnosis not present

## 2016-11-21 DIAGNOSIS — N393 Stress incontinence (female) (male): Secondary | ICD-10-CM | POA: Diagnosis not present

## 2016-11-21 DIAGNOSIS — R41841 Cognitive communication deficit: Secondary | ICD-10-CM | POA: Diagnosis not present

## 2016-11-21 DIAGNOSIS — R278 Other lack of coordination: Secondary | ICD-10-CM | POA: Diagnosis not present

## 2016-11-21 DIAGNOSIS — R2681 Unsteadiness on feet: Secondary | ICD-10-CM | POA: Diagnosis not present

## 2016-11-21 DIAGNOSIS — M6281 Muscle weakness (generalized): Secondary | ICD-10-CM | POA: Diagnosis not present

## 2016-11-26 DIAGNOSIS — M6281 Muscle weakness (generalized): Secondary | ICD-10-CM | POA: Diagnosis not present

## 2016-11-26 DIAGNOSIS — N393 Stress incontinence (female) (male): Secondary | ICD-10-CM | POA: Diagnosis not present

## 2016-11-26 DIAGNOSIS — R278 Other lack of coordination: Secondary | ICD-10-CM | POA: Diagnosis not present

## 2016-11-26 DIAGNOSIS — R2681 Unsteadiness on feet: Secondary | ICD-10-CM | POA: Diagnosis not present

## 2016-11-26 DIAGNOSIS — R41841 Cognitive communication deficit: Secondary | ICD-10-CM | POA: Diagnosis not present

## 2016-11-27 DIAGNOSIS — N393 Stress incontinence (female) (male): Secondary | ICD-10-CM | POA: Diagnosis not present

## 2016-11-27 DIAGNOSIS — R41841 Cognitive communication deficit: Secondary | ICD-10-CM | POA: Diagnosis not present

## 2016-11-27 DIAGNOSIS — R2681 Unsteadiness on feet: Secondary | ICD-10-CM | POA: Diagnosis not present

## 2016-11-27 DIAGNOSIS — M6281 Muscle weakness (generalized): Secondary | ICD-10-CM | POA: Diagnosis not present

## 2016-11-27 DIAGNOSIS — R278 Other lack of coordination: Secondary | ICD-10-CM | POA: Diagnosis not present

## 2016-11-28 DIAGNOSIS — R2681 Unsteadiness on feet: Secondary | ICD-10-CM | POA: Diagnosis not present

## 2016-11-28 DIAGNOSIS — M6281 Muscle weakness (generalized): Secondary | ICD-10-CM | POA: Diagnosis not present

## 2016-11-28 DIAGNOSIS — N393 Stress incontinence (female) (male): Secondary | ICD-10-CM | POA: Diagnosis not present

## 2016-11-28 DIAGNOSIS — R41841 Cognitive communication deficit: Secondary | ICD-10-CM | POA: Diagnosis not present

## 2016-11-28 DIAGNOSIS — R278 Other lack of coordination: Secondary | ICD-10-CM | POA: Diagnosis not present

## 2016-12-03 DIAGNOSIS — R2681 Unsteadiness on feet: Secondary | ICD-10-CM | POA: Diagnosis not present

## 2016-12-03 DIAGNOSIS — M6281 Muscle weakness (generalized): Secondary | ICD-10-CM | POA: Diagnosis not present

## 2016-12-03 DIAGNOSIS — R41841 Cognitive communication deficit: Secondary | ICD-10-CM | POA: Diagnosis not present

## 2016-12-03 DIAGNOSIS — N393 Stress incontinence (female) (male): Secondary | ICD-10-CM | POA: Diagnosis not present

## 2016-12-03 DIAGNOSIS — R278 Other lack of coordination: Secondary | ICD-10-CM | POA: Diagnosis not present

## 2016-12-04 DIAGNOSIS — R2681 Unsteadiness on feet: Secondary | ICD-10-CM | POA: Diagnosis not present

## 2016-12-04 DIAGNOSIS — R278 Other lack of coordination: Secondary | ICD-10-CM | POA: Diagnosis not present

## 2016-12-04 DIAGNOSIS — R41841 Cognitive communication deficit: Secondary | ICD-10-CM | POA: Diagnosis not present

## 2016-12-04 DIAGNOSIS — M6281 Muscle weakness (generalized): Secondary | ICD-10-CM | POA: Diagnosis not present

## 2016-12-04 DIAGNOSIS — N393 Stress incontinence (female) (male): Secondary | ICD-10-CM | POA: Diagnosis not present

## 2016-12-05 DIAGNOSIS — R2681 Unsteadiness on feet: Secondary | ICD-10-CM | POA: Diagnosis not present

## 2016-12-05 DIAGNOSIS — R278 Other lack of coordination: Secondary | ICD-10-CM | POA: Diagnosis not present

## 2016-12-05 DIAGNOSIS — M6281 Muscle weakness (generalized): Secondary | ICD-10-CM | POA: Diagnosis not present

## 2016-12-05 DIAGNOSIS — N393 Stress incontinence (female) (male): Secondary | ICD-10-CM | POA: Diagnosis not present

## 2016-12-05 DIAGNOSIS — R41841 Cognitive communication deficit: Secondary | ICD-10-CM | POA: Diagnosis not present

## 2016-12-10 DIAGNOSIS — R41841 Cognitive communication deficit: Secondary | ICD-10-CM | POA: Diagnosis not present

## 2016-12-10 DIAGNOSIS — N183 Chronic kidney disease, stage 3 (moderate): Secondary | ICD-10-CM | POA: Diagnosis not present

## 2016-12-10 DIAGNOSIS — N393 Stress incontinence (female) (male): Secondary | ICD-10-CM | POA: Diagnosis not present

## 2016-12-10 DIAGNOSIS — M6281 Muscle weakness (generalized): Secondary | ICD-10-CM | POA: Diagnosis not present

## 2016-12-10 DIAGNOSIS — D509 Iron deficiency anemia, unspecified: Secondary | ICD-10-CM | POA: Diagnosis not present

## 2016-12-10 DIAGNOSIS — R2681 Unsteadiness on feet: Secondary | ICD-10-CM | POA: Diagnosis not present

## 2016-12-10 DIAGNOSIS — Z6820 Body mass index (BMI) 20.0-20.9, adult: Secondary | ICD-10-CM | POA: Diagnosis not present

## 2016-12-10 DIAGNOSIS — Z79899 Other long term (current) drug therapy: Secondary | ICD-10-CM | POA: Diagnosis not present

## 2016-12-10 DIAGNOSIS — R278 Other lack of coordination: Secondary | ICD-10-CM | POA: Diagnosis not present

## 2016-12-12 DIAGNOSIS — M6281 Muscle weakness (generalized): Secondary | ICD-10-CM | POA: Diagnosis not present

## 2016-12-12 DIAGNOSIS — R278 Other lack of coordination: Secondary | ICD-10-CM | POA: Diagnosis not present

## 2016-12-12 DIAGNOSIS — R2681 Unsteadiness on feet: Secondary | ICD-10-CM | POA: Diagnosis not present

## 2016-12-12 DIAGNOSIS — R41841 Cognitive communication deficit: Secondary | ICD-10-CM | POA: Diagnosis not present

## 2016-12-12 DIAGNOSIS — N393 Stress incontinence (female) (male): Secondary | ICD-10-CM | POA: Diagnosis not present

## 2016-12-13 ENCOUNTER — Inpatient Hospital Stay (HOSPITAL_COMMUNITY)
Admission: EM | Admit: 2016-12-13 | Discharge: 2016-12-16 | DRG: 306 | Disposition: A | Discharge status: Skilled Nursing Facility | Payer: Medicare Other | Attending: Family Medicine | Admitting: Family Medicine

## 2016-12-13 ENCOUNTER — Encounter (HOSPITAL_COMMUNITY): Payer: Self-pay | Admitting: Internal Medicine

## 2016-12-13 ENCOUNTER — Emergency Department (HOSPITAL_COMMUNITY): Payer: Medicare Other

## 2016-12-13 DIAGNOSIS — I130 Hypertensive heart and chronic kidney disease with heart failure and stage 1 through stage 4 chronic kidney disease, or unspecified chronic kidney disease: Secondary | ICD-10-CM | POA: Diagnosis present

## 2016-12-13 DIAGNOSIS — Z87440 Personal history of urinary (tract) infections: Secondary | ICD-10-CM

## 2016-12-13 DIAGNOSIS — I5033 Acute on chronic diastolic (congestive) heart failure: Secondary | ICD-10-CM | POA: Diagnosis not present

## 2016-12-13 DIAGNOSIS — Z7901 Long term (current) use of anticoagulants: Secondary | ICD-10-CM | POA: Diagnosis not present

## 2016-12-13 DIAGNOSIS — Z888 Allergy status to other drugs, medicaments and biological substances status: Secondary | ICD-10-CM | POA: Diagnosis not present

## 2016-12-13 DIAGNOSIS — N184 Chronic kidney disease, stage 4 (severe): Secondary | ICD-10-CM | POA: Diagnosis present

## 2016-12-13 DIAGNOSIS — J9601 Acute respiratory failure with hypoxia: Secondary | ICD-10-CM | POA: Diagnosis not present

## 2016-12-13 DIAGNOSIS — E8779 Other fluid overload: Secondary | ICD-10-CM

## 2016-12-13 DIAGNOSIS — E43 Unspecified severe protein-calorie malnutrition: Secondary | ICD-10-CM

## 2016-12-13 DIAGNOSIS — G301 Alzheimer's disease with late onset: Secondary | ICD-10-CM | POA: Diagnosis not present

## 2016-12-13 DIAGNOSIS — I482 Chronic atrial fibrillation: Secondary | ICD-10-CM | POA: Diagnosis present

## 2016-12-13 DIAGNOSIS — I472 Ventricular tachycardia: Secondary | ICD-10-CM | POA: Diagnosis not present

## 2016-12-13 DIAGNOSIS — G309 Alzheimer's disease, unspecified: Secondary | ICD-10-CM | POA: Diagnosis present

## 2016-12-13 DIAGNOSIS — R0902 Hypoxemia: Secondary | ICD-10-CM | POA: Diagnosis not present

## 2016-12-13 DIAGNOSIS — E039 Hypothyroidism, unspecified: Secondary | ICD-10-CM | POA: Diagnosis present

## 2016-12-13 DIAGNOSIS — Z903 Acquired absence of stomach [part of]: Secondary | ICD-10-CM | POA: Diagnosis not present

## 2016-12-13 DIAGNOSIS — I480 Paroxysmal atrial fibrillation: Secondary | ICD-10-CM | POA: Diagnosis not present

## 2016-12-13 DIAGNOSIS — I4891 Unspecified atrial fibrillation: Secondary | ICD-10-CM | POA: Diagnosis present

## 2016-12-13 DIAGNOSIS — J90 Pleural effusion, not elsewhere classified: Secondary | ICD-10-CM

## 2016-12-13 DIAGNOSIS — R0602 Shortness of breath: Secondary | ICD-10-CM | POA: Diagnosis not present

## 2016-12-13 DIAGNOSIS — I272 Pulmonary hypertension, unspecified: Secondary | ICD-10-CM | POA: Diagnosis present

## 2016-12-13 DIAGNOSIS — F0281 Dementia in other diseases classified elsewhere with behavioral disturbance: Secondary | ICD-10-CM | POA: Diagnosis not present

## 2016-12-13 DIAGNOSIS — Z7982 Long term (current) use of aspirin: Secondary | ICD-10-CM

## 2016-12-13 DIAGNOSIS — E8770 Fluid overload, unspecified: Secondary | ICD-10-CM | POA: Diagnosis present

## 2016-12-13 DIAGNOSIS — R05 Cough: Secondary | ICD-10-CM

## 2016-12-13 DIAGNOSIS — E785 Hyperlipidemia, unspecified: Secondary | ICD-10-CM | POA: Diagnosis present

## 2016-12-13 DIAGNOSIS — I509 Heart failure, unspecified: Secondary | ICD-10-CM | POA: Diagnosis not present

## 2016-12-13 DIAGNOSIS — F0280 Dementia in other diseases classified elsewhere without behavioral disturbance: Secondary | ICD-10-CM | POA: Diagnosis present

## 2016-12-13 DIAGNOSIS — Z66 Do not resuscitate: Secondary | ICD-10-CM | POA: Diagnosis not present

## 2016-12-13 DIAGNOSIS — I5021 Acute systolic (congestive) heart failure: Secondary | ICD-10-CM | POA: Diagnosis not present

## 2016-12-13 DIAGNOSIS — Z79899 Other long term (current) drug therapy: Secondary | ICD-10-CM

## 2016-12-13 DIAGNOSIS — R059 Cough, unspecified: Secondary | ICD-10-CM

## 2016-12-13 DIAGNOSIS — I493 Ventricular premature depolarization: Secondary | ICD-10-CM | POA: Diagnosis present

## 2016-12-13 DIAGNOSIS — I350 Nonrheumatic aortic (valve) stenosis: Secondary | ICD-10-CM | POA: Diagnosis not present

## 2016-12-13 DIAGNOSIS — I361 Nonrheumatic tricuspid (valve) insufficiency: Secondary | ICD-10-CM | POA: Diagnosis not present

## 2016-12-13 DIAGNOSIS — Z91040 Latex allergy status: Secondary | ICD-10-CM

## 2016-12-13 LAB — CBC WITH DIFFERENTIAL/PLATELET
BASOS ABS: 0 10*3/uL (ref 0.0–0.1)
BASOS PCT: 0 %
EOS ABS: 0.2 10*3/uL (ref 0.0–0.7)
EOS PCT: 3 %
HEMATOCRIT: 36.6 % — AB (ref 39.0–52.0)
Hemoglobin: 12.4 g/dL — ABNORMAL LOW (ref 13.0–17.0)
Lymphocytes Relative: 12 %
Lymphs Abs: 0.7 10*3/uL (ref 0.7–4.0)
MCH: 27.6 pg (ref 26.0–34.0)
MCHC: 33.9 g/dL (ref 30.0–36.0)
MCV: 81.5 fL (ref 78.0–100.0)
MONO ABS: 0.8 10*3/uL (ref 0.1–1.0)
MONOS PCT: 13 %
NEUTROS ABS: 4.4 10*3/uL (ref 1.7–7.7)
Neutrophils Relative %: 72 %
PLATELETS: 183 10*3/uL (ref 150–400)
RBC: 4.49 MIL/uL (ref 4.22–5.81)
RDW: 16.5 % — AB (ref 11.5–15.5)
WBC: 6.1 10*3/uL (ref 4.0–10.5)

## 2016-12-13 LAB — BASIC METABOLIC PANEL
ANION GAP: 8 (ref 5–15)
BUN: 19 mg/dL (ref 6–20)
CALCIUM: 8.7 mg/dL — AB (ref 8.9–10.3)
CO2: 26 mmol/L (ref 22–32)
CREATININE: 1.91 mg/dL — AB (ref 0.61–1.24)
Chloride: 104 mmol/L (ref 101–111)
GFR, EST AFRICAN AMERICAN: 34 mL/min — AB (ref >60)
GFR, EST NON AFRICAN AMERICAN: 29 mL/min — AB (ref >60)
Glucose, Bld: 89 mg/dL (ref 65–99)
Potassium: 4 mmol/L (ref 3.5–5.1)
SODIUM: 138 mmol/L (ref 135–145)

## 2016-12-13 LAB — TROPONIN I: TROPONIN I: 0.03 ng/mL — AB (ref <0.03)

## 2016-12-13 LAB — BRAIN NATRIURETIC PEPTIDE: B Natriuretic Peptide: 3017.3 pg/mL — ABNORMAL HIGH (ref 0.0–100.0)

## 2016-12-13 NOTE — ED Triage Notes (Signed)
Patient sent from [place] via [facility] for respiratory distress.  EMS reports facility measured patient's O2 saturation at 86% on room air, patient does not require supplemental oxygen at baseline.  Patient O2 saturation 97% on room air at this time.  Audible wheezing, alert and oriented at baseline with disorientation to time.  Patient resting comfortably and in no apparent distress at this time.

## 2016-12-13 NOTE — ED Provider Notes (Addendum)
  Physical Exam  BP (!) 130/98   Pulse 64   Temp (!) 97.5 F (36.4 C) (Oral)   Resp 14   SpO2 100%   Physical Exam  ED Course  Procedures  MDM Patient brought in for reported hypoxia. Has been dyspneic in the ER with laying flat or walking around. Nursing is placed oxygen so he has not actually gotten hypoxic here. Has new pleural effusions and known aortic stenosis. With his worsening respiratory status and CHF with elevated BNP I think patient benefit from admission.     Davonna Belling, MD 12/13/16 [phone]

## 2016-12-13 NOTE — H&P (Addendum)
Justin Chambers TGG:[id number] DOB: 02/05/1924 DOA: 12/13/2016     PCP: Burnard Bunting, MD   Outpatient Specialists: [facility] Patient coming from:  From facility  [facility]  Chief Complaint: Dyspnea  HPI: Justin Chambers is a 83 y.o. male with medical history significant of dementia, atrial fibrillation on chronic anticoagulation, CK D, hypothyroidism, anemia    Presented with hypoxia down to 86% with EMS was called and patient was brought in to emergency department Mrs.he have had some cough but no fever some worsening shortness of breath worse and he weighs down flat he has not endorsed any chest pain on nausea vomiting no chills   Regarding pertinent Chronic problems:  Last admission one year ago in July 2017 for Pseudomonas sepsis secondary to UTI History of atrial fibrillation CHA2DS2-VASc is probably 2 for age. CKD baseline creatinine 1.9 Last echogram May 2017 showing preserved EF and LVH pulmonary hypertension with PA peak pressure 50 IN ER:  Temp (24hrs), Avg:97.5 F (36.4 C), Min:97.5 F (36.4 C), Max:97.5 F (36.4 C)      on arrival  ED Triage Vitals [12/13/16 1408]  Enc Vitals Group     BP (!) 147/88     Pulse Rate 77     Resp 13     Temp (!) 97.5 F (36.4 C)     Temp Source Oral     SpO2 97 %     Weight      Height      Head Circumference      Peak Flow      Pain Score      Pain Loc      Pain Edu?      Excl. in [place]?     Latest RR 14 HR 64 BP 130/98 BNP 3017 WBC 6.1 Hg 12.4 plt 183 Na 138 K 4.0 Cr 1.9 baseline Trop 0.03 Following Medications were ordered in ER: Medications - No data to display   Hospitalist was called for admission for fluid overload  Review of Systems:    Pertinent positives include:  shortness of breath at rest.   dyspnea on exertion, orthopnea Constitutional:  No weight loss, night sweats, Fevers, chills, fatigue, weight loss  HEENT:  No headaches, Difficulty swallowing,Tooth/dental problems,Sore throat,  No  sneezing, itching, ear ache, nasal congestion, post nasal drip,  Cardio-vascular:  No chest pain, Orthopnea, PND, anasarca, dizziness, palpitations.no Bilateral lower extremity swelling  GI:  No heartburn, indigestion, abdominal pain, nausea, vomiting, diarrhea, change in bowel habits, loss of appetite, melena, blood in stool, hematemesis Resp:  no No No excess mucus, no productive cough, No non-productive cough, No coughing up of blood.No change in color of mucus.No wheezing. Skin:  no rash or lesions. No jaundice GU:  no dysuria, change in color of urine, no urgency or frequency. No straining to urinate.  No flank pain.  Musculoskeletal:  No joint pain or no joint swelling. No decreased range of motion. No back pain.  Psych:  No change in mood or affect. No depression or anxiety. No memory loss.  Neuro: no localizing neurological complaints, no tingling, no weakness, no double vision, no gait abnormality, no slurred speech, no confusion  As per HPI otherwise 10 point review of systems negative.   Past Medical History: Past Medical History:  Diagnosis Date  . Alzheimer disease   . Anemia due to chronic blood loss 07/2015  . Atrial fibrillation ([place])   . Cholecystitis   . Chronic kidney disease   .  CKD (chronic kidney disease)   . Dementia   . DOE (dyspnea on exertion)   . Fracture    right leg(casted- high school), collar bone(child)  . Hypothyroidism   . Transfusion history    in past ? when   Past Surgical History:  Procedure Laterality Date  . ABDOMINAL SURGERY     part of stomach removed  . CHOLECYSTECTOMY N/A 04/27/2013   Procedure: LAPAROSCOPIC CHOLECYSTECTOMY WITH INTRAOPERATIVE CHOLANGIOGRAM;  Surgeon: Imogene Burn. [name], MD;  Location: [facility];  Service: General;  Laterality: N/A;  . ESOPHAGOGASTRODUODENOSCOPY N/A 07/30/2015   Procedure: ESOPHAGOGASTRODUODENOSCOPY (EGD);  Surgeon: Ronald Lobo, MD;  Location: [facility] ENDOSCOPY;  Service: Endoscopy;  Laterality: N/A;   Pediatric UPPER endoscope, please  . LAPAROSCOPIC LYSIS OF ADHESIONS N/A 04/27/2013   Procedure: LAPAROSCOPIC LYSIS OF ADHESIONS;  Surgeon: Imogene Burn. [name], MD;  Location: [place];  Service: General;  Laterality: N/A;  . PARTIAL GASTRECTOMY Bilateral   . TONSILLECTOMY       Social History:  Ambulatory walker    reports that he has never smoked. He has never used smokeless tobacco. He reports that he does not drink alcohol or use drugs.  Allergies:   Allergies  Allergen Reactions  . Latex   . Savaysa [Edoxaban] Other (See Comments)    Caused Hgb to drop- adverse reaction, not allergy       Family History:   Family History  Problem Relation Age of Onset  . Diabetes Neg Hx   . CAD Neg Hx     Medications: Prior to Admission medications   Medication Sig Start Date End Date Taking? Authorizing Provider  aspirin EC 81 MG tablet Take 81 mg by mouth daily.   Yes [provider]  Cholecalciferol (VITAMIN D-3) 1000 UNITS CAPS Take 1,000 Units by mouth daily.    Yes [provider]  docusate sodium (COLACE) 100 MG capsule Take 100 mg by mouth 2 (two) times daily.   Yes [provider]  Dutasteride-Tamsulosin HCl 0.5-0.4 MG CAPS Take 1 capsule by mouth daily.   Yes [provider]  iron polysaccharides (NIFEREX) 150 MG capsule Take 150 mg by mouth 2 (two) times daily.   Yes [provider]  levothyroxine (SYNTHROID, LEVOTHROID) 50 MCG tablet Take 50 mcg by mouth daily before breakfast.  01/28/14  Yes [provider]  Multiple Vitamins-Minerals (MULTIVITAMIN ADULTS 50+) TABS Take 1 tablet by mouth daily.   Yes [provider]  pantoprazole (PROTONIX) 40 MG tablet Take 1 tablet (40 mg total) by mouth daily. 07/31/15  Yes Rai, Ripudeep K, MD  ciprofloxacin (CIPRO) 500 MG tablet Take 1 tablet (500 mg total) by mouth 2 (two) times daily. For 5days Patient not taking: Reported on 10/20/2015 09/13/15   Domenic Polite, MD     Physical Exam: Patient Vitals for the past 24 hrs:  BP Temp Temp src Pulse Resp SpO2  12/13/16 1945 (!) 130/98 - - - 14 -  12/13/16 1715 113/83 - - 64 19 100 %  12/13/16 1700 (!) 150/91 - - 60 (!) 21 100 %  12/13/16 1515 (!) 139/95 - - (!) 56 15 100 %  12/13/16 1445 135/88 - - 68 16 96 %  12/13/16 1415 (!) 146/97 - - - 18 -  12/13/16 1408 (!) 147/88 (!) 97.5 F (36.4 C) Oral 77 13 97 %    1. General:  in No Acute distress   Chronically ill -appearing 2. Psychological: Alert and  Oriented to self 3. Head/ENT:  Moist  Mucous Membranes                          Head Non traumatic, neck supple                          Poor Dentition 4. SKIN:  decreased Skin turgor,  Skin clean Dry and intact no rash 5. Heart: Regular rate and rhythm systolic  Murmur, no Rub or gallop 6. Lungs:  no wheezes or crackles   7. Abdomen: Soft, non-tender, Non distended   bowel sounds present 8. Lower extremities: no clubbing, cyanosis, or edema 9. Neurologically Grossly intact, moving all 4 extremities equally   10. MSK: Normal range of motion   body mass index is unknown because there is no height or weight on file.  Labs on Admission:   Labs on Admission: I have personally reviewed following labs and imaging studies  CBC:  Recent Labs Lab 12/13/16 1547  WBC 6.1  NEUTROABS 4.4  HGB 12.4*  HCT 36.6*  MCV 81.5  PLT 858   Basic Metabolic Panel:  Recent Labs Lab 12/13/16 1547  NA 138  K 4.0  CL 104  CO2 26  GLUCOSE 89  BUN 19  CREATININE 1.91*  CALCIUM 8.7*   GFR: CrCl cannot be calculated (Unknown ideal weight.). Liver Function Tests: No results for input(s): AST, ALT, ALKPHOS, BILITOT, PROT, ALBUMIN in the last 168 hours. No results for input(s): LIPASE, AMYLASE in the last 168 hours. No results for input(s): AMMONIA in the last 168 hours. Coagulation Profile: No results for input(s): INR, PROTIME in the last 168 hours. Cardiac Enzymes:  Recent Labs Lab 12/13/16 1547   TROPONINI <0.03   BNP (last 3 results) No results for input(s): PROBNP in the last 8760 hours. HbA1C: No results for input(s): HGBA1C in the last 72 hours. CBG: No results for input(s): GLUCAP in the last 168 hours. Lipid Profile: No results for input(s): CHOL, HDL, LDLCALC, TRIG, CHOLHDL, LDLDIRECT in the last 72 hours. Thyroid Function Tests: No results for input(s): TSH, T4TOTAL, FREET4, T3FREE, THYROIDAB in the last 72 hours. Anemia Panel: No results for input(s): VITAMINB12, FOLATE, FERRITIN, TIBC, IRON, RETICCTPCT in the last 72 hours. Urine analysis:  Sepsis Labs: @LABRCNTIP (procalcitonin:4,lacticidven:4) )No results found for this or any previous visit (from the past 240 hour(s)).     UA  ordered  No results found for: HGBA1C  CrCl cannot be calculated (Unknown ideal weight.).  BNP (last 3 results) No results for input(s): PROBNP in the last 8760 hours.   ECG REPORT  Independently reviewed Rate: 64  Rhythm: NSR ST&T Change: T wave inversion QTC 398  There were no vitals filed for this visit.   Cultures:    Component Value Date/Time   SDES URINE, CATHETERIZED 09/09/2015 1412   SPECREQUEST NONE 09/09/2015 1412   CULT >=100,000 COLONIES/mL PSEUDOMONAS AERUGINOSA (A) 09/09/2015 1412   REPTSTATUS 09/11/2015 FINAL 09/09/2015 1412     Radiological Exams on Admission: Dg Chest 2 View  Result Date: 12/13/2016 CLINICAL DATA:  Shortness of breath. EXAM: CHEST  2 VIEW COMPARISON:  Radiograph of September 09, 2015. FINDINGS: Stable cardiomediastinal silhouette. Atherosclerosis of thoracic aorta is noted. No pneumothorax is noted. Moderate bilateral pleural effusions are noted, right greater than left, with probable underlying atelectasis or edema. Bony thorax is unremarkable. IMPRESSION: Interval development of moderate bilateral pleural effusions with probable underlying atelectasis or edema. Electronically Signed  By: Marijo Conception, M.D.   On: 12/13/2016 15:20     Chart has been reviewed    Assessment/Plan   83 y.o. male with medical history significant of dementia, atrial fibrillation not on chronic anticoagulation, CK D, hypothyroidism, anemia   Admitted for fluid overload  Present on Admission:  . Fluid overload - will obtain echo and diurese, cardiology consult in AM, cycle troponin . Acute respiratory failure with hypoxia (HCC) - likley due to fluid overload, improving with diuresis and oxygen therapy . Alzheimer disease - chronic expect some degree of sundowning . Atrial fibrillation ([place])          - CHA2DS2 vas score 2 :   Not on anticoagulation secondary to Risk of Falls,  recurrent bleeding         -  Rate control:  Currently controlled  Hypothyroidism - check TSH continue home meds CKD - at baseline avoid nephrotoxic medications Other plan as per orders.  DVT prophylaxis:    Lovenox     Code Status:  FULL CODE will need to address goals of care with family in AM  Family Communication:   Family not  at  Bedside     Disposition Plan:    Back to current facility when stable                                   Would benefit from PT/OT eval prior to DC                      Social Work     consulted                       Consults called: email cardiology  Admission status:   inpatient       Level of care tele           I have spent a total of 56 min on this admission   Mahlani Berninger 12/13/2016, 10:33 PM     [facility]  Pager [phone]   after 2 AM please page floor coverage PA If 7AM-7PM, please contact the day team taking care of the patient  [email or web address]  Password [email or web address]

## 2016-12-13 NOTE — ED Notes (Signed)
Attempted to call report to floor. Will reattempt.

## 2016-12-13 NOTE — ED Notes (Signed)
Pt daughter [name] updated on plan of care.

## 2016-12-13 NOTE — ED Notes (Signed)
Pt out of bed walking around room, states he is getting ready. Pt redirected back to bed and placed back on monitoring. Pt removed PIV. Orders placed for telesitter.

## 2016-12-13 NOTE — ED Notes (Signed)
Telesitter at bedside.

## 2016-12-13 NOTE — ED Notes (Signed)
Pt daughter, [facility] staff updated on pt.

## 2016-12-13 NOTE — ED Provider Notes (Signed)
[facility] DEPT Provider Note   CSN: [id number] Arrival date & time: 12/13/16  1401     History   Chief Complaint Chief Complaint  Patient presents with  . Shortness of Breath    HPI Justin Chambers is a 83 y.o. male.  The history is provided by the patient and medical records. No language interpreter was used.  Cough  This is a new problem. The current episode started 2 days ago. The problem occurs every few hours. The problem has been resolved. The cough is non-productive. There has been no fever. Associated symptoms include shortness of breath (resolved). Pertinent negatives include no chest pain, no chills, no sweats, no weight loss, no headaches, no rhinorrhea, no sore throat and no wheezing. He has tried nothing for the symptoms. The treatment provided no relief.    Past Medical History:  Diagnosis Date  . Alzheimer disease   . Anemia due to chronic blood loss 07/2015  . Atrial fibrillation ([place])   . Cholecystitis   . Chronic kidney disease   . CKD (chronic kidney disease)   . Dementia   . DOE (dyspnea on exertion)   . Fracture    right leg(casted- high school), collar bone(child)  . Hypothyroidism   . Transfusion history    in past ? when    Patient Active Problem List   Diagnosis Date Noted  . Hematuria, gross   . Sepsis ([place]) 09/09/2015  . Hematuria 09/09/2015  . Hypotension 09/09/2015  . Aortic stenosis 07/29/2015  . Hypothyroidism 07/29/2015  . Anemia due to chronic blood loss 07/28/2015  . DOE (dyspnea on exertion) 07/28/2015  . Blood loss anemia 07/28/2015  . Sick sinus syndrome ([place]) 04/24/2014  . Microcytic anemia 02/17/2014  . Chronic anticoagulation 02/17/2014  . Acute gangrenous cholecystitis 05/18/2013  . Cholecystitis 04/24/2013  . Chronic kidney disease (CKD) stage G3a/A3, moderately decreased glomerular filtration rate (GFR) between 45-59 mL/min/1.73 square meter and albuminuria creatinine ratio greater than 300 mg/g (HCC) 04/24/2013    Class: Chronic  . Atrial fibrillation ([place]) 04/24/2013  . Alzheimer disease 04/24/2013    Past Surgical History:  Procedure Laterality Date  . ABDOMINAL SURGERY     part of stomach removed  . CHOLECYSTECTOMY N/A 04/27/2013   Procedure: LAPAROSCOPIC CHOLECYSTECTOMY WITH INTRAOPERATIVE CHOLANGIOGRAM;  Surgeon: Imogene Burn. [name], MD;  Location: [facility];  Service: General;  Laterality: N/A;  . ESOPHAGOGASTRODUODENOSCOPY N/A 07/30/2015   Procedure: ESOPHAGOGASTRODUODENOSCOPY (EGD);  Surgeon: Ronald Lobo, MD;  [facility] END[facility]COPY;  Service: Endoscopy;  Laterality: N/A;  Pediatric UPPER endoscope, please  . LAPAROSCOPIC LYSIS OF ADHESIONS N/A 04/27/2013   Procedure: LAPAROSCOPIC LYSIS OF ADHESIONS;  Surgeon: Imogene Burn. [name], MD;  Location: [facility] OR;  Service: General;  Laterality: N/A;  . PARTIAL GASTRECTOMY Bilateral   . TONSILLECTOMY         Home Medications    Prior to Admission medications   Medication Sig Start Date End Date Taking? Authorizing Provider  Cholecalciferol (VITAMIN D-3) 1000 UNITS CAPS Take 1,000 Units by mouth daily.     [provider]  ciprofloxacin (CIPRO) 500 MG tablet Take 1 tablet (500 mg total) by mouth 2 (two) times daily. For 5days Patient not taking: Reported on 10/20/2015 09/13/15   Domenic Polite, MD  Dutasteride-Tamsulosin HCl 0.5-0.4 MG CAPS Take 1 capsule by mouth daily.    [provider]  iron polysaccharides (NIFEREX) 150 MG capsule Take 150 mg by mouth 2 (two) times daily.    [provider]  levothyroxine (  SYNTHROID, LEVOTHROID) 50 MCG tablet Take 50 mcg by mouth daily before breakfast.  01/28/14   [provider]  Multiple Vitamins-Minerals (MULTIVITAMIN ADULTS 50+) TABS Take 1 tablet by mouth daily.    [provider]  pantoprazole (PROTONIX) 40 MG tablet Take 1 tablet (40 mg total) by mouth daily. 07/31/15   Mendel Corning, MD    Family History No family history on file.  Social History Social  History  Substance Use Topics  . Smoking status: Never Smoker  . Smokeless tobacco: Never Used  . Alcohol use No     Allergies   Latex and Savaysa [edoxaban]   Review of Systems Review of Systems  Constitutional: Negative for chills, fatigue, fever and weight loss.  HENT: Negative for congestion, rhinorrhea and sore throat.   Eyes: Negative for visual disturbance.  Respiratory: Positive for cough and shortness of breath (resolved). Negative for chest tightness, wheezing and stridor.   Cardiovascular: Negative for chest pain and palpitations.  Gastrointestinal: Negative for abdominal pain, constipation, diarrhea, nausea and vomiting.  Genitourinary: Negative for dysuria, flank pain and frequency.  Musculoskeletal: Negative for back pain, neck pain and neck stiffness.  Skin: Negative for rash and wound.  Neurological: Negative for light-headedness, numbness and headaches.  Psychiatric/Behavioral: Negative for agitation.  All other systems reviewed and are negative.    Physical Exam Updated Vital Signs BP (!) 146/97   Pulse 77   Temp (!) 97.5 F (36.4 C) (Oral)   Resp 18   SpO2 97%   Physical Exam  Constitutional: He is oriented to person, place, and time. He appears well-developed and well-nourished. No distress.  HENT:  Head: Normocephalic and atraumatic.  Mouth/Throat: Oropharynx is clear and moist. No oropharyngeal exudate.  Eyes: Pupils are equal, round, and reactive to light. Conjunctivae and EOM are normal.  Neck: Normal range of motion. Neck supple.  Cardiovascular: Normal rate and intact distal pulses.   No murmur heard. Pulmonary/Chest: Effort normal and breath sounds normal. No stridor. No respiratory distress. He has no wheezes. He has no rales. He exhibits no tenderness.  Abdominal: Soft. There is no tenderness.  Musculoskeletal: He exhibits no edema or tenderness.  Neurological: He is alert and oriented to person, place, and time. No sensory deficit. He  exhibits normal muscle tone.  Skin: Skin is warm and dry. He is not diaphoretic. No erythema. No pallor.  Psychiatric: He has a normal mood and affect.  Nursing note and vitals reviewed.    ED Treatments / Results  Labs (all labs ordered are listed, but only abnormal results are displayed) Labs Reviewed  CBC WITH DIFFERENTIAL/PLATELET - Abnormal; Notable for the following:       Result Value   Hemoglobin 12.4 (*)    HCT 36.6 (*)    RDW 16.5 (*)    All other components within normal limits  BASIC METABOLIC PANEL - Abnormal; Notable for the following:    Creatinine, Ser 1.91 (*)    Calcium 8.7 (*)    GFR calc non Af Amer 29 (*)    GFR calc Af Amer 34 (*)    All other components within normal limits  BRAIN NATRIURETIC PEPTIDE  TROPONIN I    EKG  EKG Interpretation  Date/Time:  Friday December 13 2016 14:05:49 EDT Ventricular Rate:  64 PR Interval:    QRS Duration: 100 QT Interval:  385 QTC Calculation: 398 R Axis:   21 Text Interpretation:  Sinus rhythm Multiple premature complexes, vent & supraven Anteroseptal  infarct, old Nonspecific repol abnormality, diffuse leads Baseline wander in lead(s) I when compared to prior, T wave flattening/inversions in leads V4-V6.  No STEMI Confirmed by Antony Blackbird [phone]) on 12/13/2016 2:21:58 PM       Radiology Dg Chest 2 View  Result Date: 12/13/2016 CLINICAL DATA:  Shortness of breath. EXAM: CHEST  2 VIEW COMPARISON:  Radiograph of September 09, 2015. FINDINGS: Stable cardiomediastinal silhouette. Atherosclerosis of thoracic aorta is noted. No pneumothorax is noted. Moderate bilateral pleural effusions are noted, right greater than left, with probable underlying atelectasis or edema. Bony thorax is unremarkable. IMPRESSION: Interval development of moderate bilateral pleural effusions with probable underlying atelectasis or edema. Electronically Signed   By: Marijo Conception, M.D.   On: 12/13/2016 15:20    Procedures Procedures (including  critical care time)  Medications Ordered in ED Medications - No data to display   Initial Impression / Assessment and Plan / ED Course  I have reviewed the triage vital signs and the nursing notes.  Pertinent labs & imaging results that were available during my care of the patient were reviewed by me and considered in my medical decision making (see chart for details).     Justin Chambers is a 83 y.o. male with a past medical history significant for atrial fibrillation, CK D, Alzheimer's, sick sinus syndrome, hypothyroidism, and aortic stenosis who presents with mild cough and shortness of breath. Patient is brought in by EMS. EMS reports that the patient's facility, patient was measured having oxygen saturation 86% on room air previously. Patient does not require oxygen at home. EMS was called and on EMS arrival, patient was on 97% on room air. Due to the reported hypoxia, patient was transported for evaluation. Patient reports that he is having no chest pain. He denies any shortness of breath but when asked about the low oxygen that was found he said that he may have had shortness of breath temporarily. He does report that he's had a dry cough for the last 2 days but he is not concerned about it. He denies any fevers, chills, nausea, vomiting, conservation, diarrhea, dysuria. He denies any other complaints.   On exam, patient's lungs are clear. Abdomen nontender. Chest is nontender. Patient is disoriented but pleasant. No lower extremity edema. Next  Given the patient's cough and the possible shortness of breath, he will have x-ray and laboratory testing to look for pneumonia or other etiology of his symptoms. Patient has been on room air and has maintained an oxygen saturation in the upper 90s. Patient is currently in no distress and is resting comfortably.  Anticipate following up on the imaging and lab testing to determine disposition. Anticipate patient will be stable for discharge home if  workup is reassuring.  Patient's x-ray reveal pleural effusions bilaterally. Given the reported shortness of breath and hypoxic episode, he will have a troponin and BNP ordered. Care transferred to Dr. Alvino Chapel while awaiting for the repeat laboratory testing and reassessment. If patient remains symptom-free and his laboratory testing is reassuring, anticipate discharge back to his facility.   Final Clinical Impressions(s) / ED Diagnoses   Final diagnoses:  Cough    Clinical Impression: 1. Cough     Disposition: care transferred to Dr. Alvino Chapel while awaiting reassessment and laboratory testing.     Tegeler, Gwenyth Allegra, MD 12/13/16 [phone]

## 2016-12-14 ENCOUNTER — Other Ambulatory Visit (HOSPITAL_COMMUNITY): Payer: Medicare Other

## 2016-12-14 DIAGNOSIS — I5033 Acute on chronic diastolic (congestive) heart failure: Secondary | ICD-10-CM

## 2016-12-14 LAB — MRSA PCR SCREENING: MRSA BY PCR: NEGATIVE

## 2016-12-14 LAB — COMPREHENSIVE METABOLIC PANEL
ALT: 10 U/L — AB (ref 17–63)
AST: 20 U/L (ref 15–41)
Albumin: 2.8 g/dL — ABNORMAL LOW (ref 3.5–5.0)
Alkaline Phosphatase: 76 U/L (ref 38–126)
Anion gap: 9 (ref 5–15)
BILIRUBIN TOTAL: 1.2 mg/dL (ref 0.3–1.2)
BUN: 19 mg/dL (ref 6–20)
CO2: 25 mmol/L (ref 22–32)
CREATININE: 1.83 mg/dL — AB (ref 0.61–1.24)
Calcium: 8.5 mg/dL — ABNORMAL LOW (ref 8.9–10.3)
Chloride: 103 mmol/L (ref 101–111)
GFR, EST AFRICAN AMERICAN: 35 mL/min — AB (ref >60)
GFR, EST NON AFRICAN AMERICAN: 31 mL/min — AB (ref >60)
Glucose, Bld: 70 mg/dL (ref 65–99)
Potassium: 4.3 mmol/L (ref 3.5–5.1)
Sodium: 137 mmol/L (ref 135–145)
TOTAL PROTEIN: 5.3 g/dL — AB (ref 6.5–8.1)

## 2016-12-14 LAB — TROPONIN I
Troponin I: 0.04 ng/mL (ref <0.03)
Troponin I: 0.03 ng/mL (ref <0.03)

## 2016-12-14 LAB — CBC
HCT: 42.1 % (ref 39.0–52.0)
Hemoglobin: 13.9 g/dL (ref 13.0–17.0)
MCH: 26.9 pg (ref 26.0–34.0)
MCHC: 33 g/dL (ref 30.0–36.0)
MCV: 81.4 fL (ref 78.0–100.0)
Platelets: 193 10*3/uL (ref 150–400)
RBC: 5.17 MIL/uL (ref 4.22–5.81)
RDW: 16.5 % — AB (ref 11.5–15.5)
WBC: 5.6 10*3/uL (ref 4.0–10.5)

## 2016-12-14 LAB — TSH: TSH: 2.398 u[IU]/mL (ref 0.350–4.500)

## 2016-12-14 LAB — PHOSPHORUS: PHOSPHORUS: 4.4 mg/dL (ref 2.5–4.6)

## 2016-12-14 LAB — MAGNESIUM: Magnesium: 1.7 mg/dL (ref 1.7–2.4)

## 2016-12-14 MED ORDER — SODIUM CHLORIDE 0.9% FLUSH
3.0000 mL | Freq: Two times a day (BID) | INTRAVENOUS | Status: DC
  Administered 2016-12-14 – 2016-12-15 (×5): 3 mL via INTRAVENOUS

## 2016-12-14 MED ORDER — SODIUM CHLORIDE 0.9 % IV SOLN: 250.0000 mL | INTRAVENOUS | Status: DC | PRN

## 2016-12-14 MED ORDER — ACETAMINOPHEN 650 MG RE SUPP: 650.0000 mg | Freq: Four times a day (QID) | RECTAL | Status: DC | PRN

## 2016-12-14 MED ORDER — FUROSEMIDE 10 MG/ML IJ SOLN
40.0000 mg | Freq: Two times a day (BID) | INTRAMUSCULAR | Status: DC
  Administered 2016-12-14 (×2): 40 mg via INTRAVENOUS
  Filled 2016-12-14 (×2): qty 4

## 2016-12-14 MED ORDER — ACETAMINOPHEN 325 MG PO TABS
650.0000 mg | ORAL_TABLET | Freq: Four times a day (QID) | ORAL | Status: DC | PRN
Start: 2016-12-14 — End: 2016-12-16
  Filled 2016-12-14: qty 2

## 2016-12-14 MED ORDER — ONDANSETRON HCL 4 MG PO TABS: 4.0000 mg | ORAL_TABLET | Freq: Four times a day (QID) | ORAL | Status: DC | PRN

## 2016-12-14 MED ORDER — LEVOTHYROXINE SODIUM 50 MCG PO TABS
50.0000 ug | ORAL_TABLET | Freq: Every day | ORAL | Status: DC
  Administered 2016-12-14 – 2016-12-16 (×3): 50 ug via ORAL
  Filled 2016-12-14 (×3): qty 1

## 2016-12-14 MED ORDER — PANTOPRAZOLE SODIUM 40 MG PO TBEC
40.0000 mg | DELAYED_RELEASE_TABLET | Freq: Every day | ORAL | Status: DC
  Administered 2016-12-14 – 2016-12-16 (×3): 40 mg via ORAL
  Filled 2016-12-14 (×3): qty 1

## 2016-12-14 MED ORDER — ENOXAPARIN SODIUM 30 MG/0.3ML ~~LOC~~ SOLN
30.0000 mg | Freq: Every day | SUBCUTANEOUS | Status: DC
  Administered 2016-12-14 – 2016-12-16 (×3): 30 mg via SUBCUTANEOUS
  Filled 2016-12-14 (×3): qty 0.3

## 2016-12-14 MED ORDER — ONDANSETRON HCL 4 MG/2ML IJ SOLN: 4.0000 mg | Freq: Four times a day (QID) | INTRAMUSCULAR | Status: DC | PRN

## 2016-12-14 MED ORDER — SODIUM CHLORIDE 0.9% FLUSH: 3.0000 mL | INTRAVENOUS | Status: DC | PRN

## 2016-12-14 MED ORDER — HYDROCODONE-ACETAMINOPHEN 5-325 MG PO TABS: 1.0000 | ORAL_TABLET | ORAL | Status: DC | PRN

## 2016-12-14 MED ORDER — FUROSEMIDE 10 MG/ML IJ SOLN: 40.0000 mg | Freq: Two times a day (BID) | INTRAMUSCULAR | Status: DC

## 2016-12-14 MED ORDER — POLYETHYLENE GLYCOL 3350 17 G PO PACK: 17.0000 g | PACK | Freq: Every day | ORAL | Status: DC | PRN

## 2016-12-14 MED ORDER — FUROSEMIDE 10 MG/ML IJ SOLN
40.0000 mg | Freq: Three times a day (TID) | INTRAMUSCULAR | Status: DC
  Administered 2016-12-14 – 2016-12-15 (×3): 40 mg via INTRAVENOUS
  Filled 2016-12-14 (×3): qty 4

## 2016-12-14 NOTE — Consult Note (Signed)
CARDIOLOGY CONSULT NOTE   Patient ID: Justin Chambers MRN: [id number], DOB/AGE: 05-Jul-1923   Admit date: 12/13/2016 Date of Consult: 12/14/2016  Primary Physician: Burnard Bunting, MD Primary Cardiologist: Dr Claiborne Billings  Reason for consult:  CHF  Problem List  Past Medical History:  Diagnosis Date  . Alzheimer disease   . Anemia due to chronic blood loss 07/2015  . Atrial fibrillation ([place])   . Cholecystitis   . Chronic kidney disease   . CKD (chronic kidney disease)   . Dementia   . DOE (dyspnea on exertion)   . Fracture    right leg(casted- high school), collar bone(child)  . Hypothyroidism   . Transfusion history    in past ? when    Past Surgical History:  Procedure Laterality Date  . ABDOMINAL SURGERY     part of stomach removed  . CHOLECYSTECTOMY N/A 04/27/2013   Procedure: LAPAROSCOPIC CHOLECYSTECTOMY WITH INTRAOPERATIVE CHOLANGIOGRAM;  Surgeon: Imogene Burn. [name], MD;  Location: [facility];  Service: General;  Laterality: N/A;  . ESOPHAGOGASTRODUODENOSCOPY N/A 07/30/2015   Procedure: ESOPHAGOGASTRODUODENOSCOPY (EGD);  Surgeon: Ronald Lobo, MD;  Location: [facility] ENDOSCOPY;  Service: Endoscopy;  Laterality: N/A;  Pediatric UPPER endoscope, please  . LAPAROSCOPIC LYSIS OF ADHESIONS N/A 04/27/2013   Procedure: LAPAROSCOPIC LYSIS OF ADHESIONS;  Surgeon: Imogene Burn. [name], MD;  Location: [facility] OR;  Service: General;  Laterality: N/A;  . PARTIAL GASTRECTOMY Bilateral   . TONSILLECTOMY       Allergies  Allergies  Allergen Reactions  . Latex   . Savaysa [Edoxaban] Other (See Comments)    Caused Hgb to drop- adverse reaction, not allergy   Justin Chambers is a 83 y.o. male who is being seen today for the evaluation of CHF at the request of Dr Roel Cluck, [place] Lint.   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  I had seen him for initial  cardiology evaluation in December 2015 through the referral of Dr. [name] for evaluation of a slow pulse.  He was seen by Dr. [name] on 01/26/2014 at which time his heart rate was noted to be at 47 in atrial fibrillation.  H was on digoxin 0.125 mg daily and a digoxin level was 1.0.  He also had been taking metoprolol tartrate one half of a 12.5 mg pill twice a day.  He was started on NOAC therapy with Savaysa at 30 mg daily.  He denied any bleeding.   Last admission one year ago in July 2017 for Pseudomonas sepsis secondary to UTI. History of atrial fibrillation CHA2DS2-VASc is probably 2 for age.  TTE in 07/2015 showed LVEF 55% to 60%. Moderate AS, PA peak pressure: 50 mm Hg (S).  Last admission one year ago in July 2017 for Pseudomonas sepsis secondary to UTI. CKD baseline creatinine 1.9  He presented with worsening SOB, was admitted with hypoxia down to 86%,  No chest pain. Weight on admission 131 lbs, baseline unknown, he has lost significant amount of weight in the last year.   CKD baseline creatinine 1.8-2.0, today 1.8. K 4.3 Troponin 0.03->0.04->0.03 BNP 3000 Hb 12.4  Inpatient Medications  . enoxaparin (LOVENOX) injection  30 mg Subcutaneous Daily  . furosemide  40 mg Intravenous BID  . levothyroxine  50 mcg Oral QAC breakfast  . pantoprazole  40 mg Oral Daily  . sodium chloride flush  3 mL Intravenous  Q12H   Family History Family History  Problem Relation Age of Onset  . Diabetes Neg Hx   . CAD Neg Hx     Social History Social History   Social History  . Marital status: Legally Separated    Spouse name: N/A  . Number of children: N/A  . Years of education: N/A   Occupational History  . Not on file.   Social History Main Topics  . Smoking status: Never Smoker  . Smokeless tobacco: Never Used  . Alcohol use No  . Drug use: No  . Sexual activity: No   Other Topics Concern  . Not on file   Social History Narrative  . No narrative on file    Review of  Systems  General:  No chills, fever, night sweats or weight changes.  Cardiovascular:  No chest pain, dyspnea on exertion, edema, orthopnea, palpitations, paroxysmal nocturnal dyspnea. Dermatological: No rash, lesions/masses Respiratory: No cough, dyspnea Urologic: No hematuria, dysuria Abdominal:   No nausea, vomiting, diarrhea, bright red blood per rectum, melena, or hematemesis Neurologic:  No visual changes, wkns, changes in mental status. All other systems reviewed and are otherwise negative except as noted above.  Physical Exam  Blood pressure 133/79, pulse 68, temperature 97.9 F (36.6 C), temperature source Oral, resp. rate 18, height 6' (1.829 m), weight 130 lb (59 kg), SpO2 100 %.  General: Pleasant, NAD, confused Psych: Normal affect. Neuro: confused, oriented to place only. Moves all extremities spontaneously. HEENT: Normal  Neck: JVD + 8 cm B/L Lungs:  Crackles B/L and decreased BS at the bases. Heart: iRRR 4/6 systolic murmur, no S2. Abdomen: Soft, non-tender, non-distended, BS + x 4.  Extremities: No clubbing, cyanosis, B/L +2 pitting edema. DP/PT/Radials 2+ and equal bilaterally.  Labs  Recent Labs  12/13/16 1547 12/13/16 2050 12/14/16 0237  TROPONINI <0.03 0.03* 0.04*   Lab Results  Component Value Date   WBC 6.1 12/13/2016   HGB 12.4 (L) 12/13/2016   HCT 36.6 (L) 12/13/2016   MCV 81.5 12/13/2016   PLT 183 12/13/2016    Recent Labs Lab 12/13/16 1547  NA 138  K 4.0  CL 104  CO2 26  BUN 19  CREATININE 1.91*  CALCIUM 8.7*  GLUCOSE 89   No results found for: CHOL, HDL, LDLCALC, TRIG No results found for: DDIMER Invalid input(s): POCBNP  Radiology/Studies  Dg Chest 2 View  Result Date: 12/13/2016 CLINICAL DATA:  Shortness of breath. EXAM: CHEST  2 VIEW COMPARISON:  Radiograph of September 09, 2015. FINDINGS: Stable cardiomediastinal silhouette. Atherosclerosis of thoracic aorta is noted. No pneumothorax is noted. Moderate bilateral pleural  effusions are noted, right greater than left, with probable underlying atelectasis or edema. Bony thorax is unremarkable. IMPRESSION: Interval development of moderate bilateral pleural effusions with probable underlying atelectasis or edema. Electronically Signed   By: Marijo Conception, M.D.   On: 12/13/2016 15:20   Echocardiogram 07/2015 - Left ventricle: The cavity size was normal. Wall thickness was   increased in a pattern of mild LVH. Systolic function was normal.   The estimated ejection fraction was in the range of 55% to 60%.   Wall motion was normal; there were no regional wall motion   abnormalities. - Aortic valve: Valve mobility was restricted. There was moderate   stenosis. There was trivial regurgitation. - Mitral valve: Calcified annulus. - Left atrium: The atrium was moderately dilated. - Right ventricle: The cavity size was mildly dilated. - Right atrium: The atrium was  moderately dilated. - Pulmonary arteries: Systolic pressure was moderately increased.   PA peak pressure: 50 mm Hg (S).  Impressions: - Normal LV systolic function; moderate biatrial enlargement; mild   RVE; heavily calcified aortic valve with moderate AS (mean   gradient 32 mmHg) and trace AI; mild TR with moderately elevated   pulmonary pressure.  ECG: SR, negative T waves in the lateral leads, PVCs, personally reviewed  Telemetry - a-fib with rates 80-100, personally reviewed   ASSESSMENT AND PLAN  1. Acute on chronic diastolic CHF - possibly sec to severe AS and low albumin -  We can increase lasix to 40 mg iv Q8H,  I will check echo for LVEF and degree of aortic stenosis, however with advanced dementia he is not a candidate for any aggressive therapies.   2. Aortic stenosis - based on physical exam severe  3. Hypertension - managed by lasix for now  4. Dementia - advanced  Signed, Ena Dawley, MD, [facility] 12/14/2016, 11:38 AM

## 2016-12-14 NOTE — Progress Notes (Signed)
PROGRESS NOTE    Justin Chambers  CXK:[id number] DOB: 06/07/1923 DOA: 12/13/2016 PCP: Burnard Bunting, MD  Outpatient Specialists: Dr. Claiborne Billings     Brief Narrative:  83 yo M with dementia, Afib, hx AS, CKD baseline Cr 1.9 and hypothyroidism presents with hypoxia from SNF, found to have CHF, with BNP >3000 and effusions on CXR.   Assessment & Plan:  Active Problems:   Atrial fibrillation (HCC)   Alzheimer disease   Aortic stenosis   CHF (congestive heart failure) (HCC)   Fluid overload   Acute respiratory failure with hypoxia (HCC)   Acute probably diastolic CHF In setting of AS, last EF in 2017 55%, and mild LVH.  Doesn't appear from our chart that he has a history of CHF per se. -Furosemide IV -K supplement -Strict I/Os, daily weights, telemetry  -Daily monitoring renal function -Echo pending -Cardiology recommendations are appreciated  Elevated troponin Trace elevation.  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  Stable  Pleural effusions From CHF -Diuresis as above  Atrial fibrillation CHADS2Vasc 2, not anticoagulated due to risk of falls.          DVT prophylaxis: Lovenox low dose Code Status: FULL Family Communication: None present Disposition Plan: IV diuresis and monitor respiratory status.  Likely will need 2-3 days diuresis.  Echo pending   Consultants:   Cardiology  Procedures:   Echo    Subjective: Caveat that history is unreliable given dementia.  He is reportedly impulsive per nursing, fall risk.    Objective: Vitals:   12/13/16 2345 12/14/16 0653 12/14/16 0816 12/14/16 1250  BP: (!) 143/92 (!)  148/86 133/79 116/78  Pulse: 62 82 68 73  Resp: 20 18 18 18   Temp: 97.8 F (36.6 C) 98 F (36.7 C) 97.9 F (36.6 C) 98 F (36.7 C)  TempSrc: Oral Oral Oral Oral  SpO2: 99% 99% 100% 98%  Weight: 59.8 kg (131 lb 12.8 oz) 59 kg (130 lb)    Height: 6' (1.829 m)       Intake/Output Summary (Last 24 hours) at 12/14/16 1600 Last data filed at 12/14/16 1216  Gross per 24 hour  Intake              240 ml  Output             1100 ml  Net             -860 ml   Filed Weights   12/13/16 2345 12/14/16 0653  Weight: 59.8 kg (131 lb 12.8 oz) 59 kg (130 lb)    Examination:  General exam: Thin elderly Appears calm and comfortable  Respiratory system: Clear to auscultation. Rales at bases. Cardiovascular system: S1 & S2 heard, RRR  Loud SEM. No rubs, gallops or clicks. No pedal edema. Gastrointestinal system: Abdomen is nondistended, soft and nontender. No organomegaly or masses felt. Normal bowel sounds heard. Central nervous system: Alert and oriented to [facility], states year is 38. No focal neurological deficits. Extremities: Symmetric 5 x 5 power. Skin: No rashes, lesions or ulcers Psychiatry: Judgement and insight appear poor, from dementia. Mood & affect appropriate.     Data Reviewed: I have personally reviewed following labs and  imaging studies  CBC:  Recent Labs Lab 12/13/16 1547 12/14/16 1231  WBC 6.1 5.6  NEUTROABS 4.4  --   HGB 12.4* 13.9  HCT 36.6* 42.1  MCV 81.5 81.4  PLT 183 419   Basic Metabolic Panel:  Recent Labs Lab 12/13/16 1547 12/14/16 1004  NA 138 137  K 4.0 4.3  CL 104 103  CO2 26 25  GLUCOSE 89 70  BUN 19 19  CREATININE 1.91* 1.83*  CALCIUM 8.7* 8.5*  MG  --  1.7  PHOS  --  4.4   GFR: Estimated Creatinine Clearance: 21.9 mL/min (A) (by C-G formula based on SCr of 1.83 mg/dL (H)). Liver Function Tests:  Recent Labs Lab 12/14/16 1004  AST 20  ALT 10*  ALKPHOS 76  BILITOT 1.2  PROT 5.3*  ALBUMIN 2.8*   No results for  input(s): LIPASE, AMYLASE in the last 168 hours. No results for input(s): AMMONIA in the last 168 hours. Coagulation Profile: No results for input(s): INR, PROTIME in the last 168 hours. Cardiac Enzymes:  Recent Labs Lab 12/13/16 1547 12/13/16 2050 12/14/16 0237 12/14/16 1004  TROPONINI <0.03 0.03* 0.04* 0.03*   BNP (last 3 results) No results for input(s): PROBNP in the last 8760 hours. HbA1C: No results for input(s): HGBA1C in the last 72 hours. CBG: No results for input(s): GLUCAP in the last 168 hours. Lipid Profile: No results for input(s): CHOL, HDL, LDLCALC, TRIG, CHOLHDL, LDLDIRECT in the last 72 hours. Thyroid Function Tests:  Recent Labs  12/14/16 0237  TSH 2.398   Anemia Panel: No results for input(s): VITAMINB12, FOLATE, FERRITIN, TIBC, IRON, RETICCTPCT in the last 72 hours. Urine analysis:    Component Value Date/Time   COLORURINE AMBER (A) 09/09/2015 1412   APPEARANCEUR CLOUDY (A) 09/09/2015 1412   LABSPEC 1.019 09/09/2015 1412   PHURINE 5.5 09/09/2015 1412   GLUCOSEU NEGATIVE 09/09/2015 1412   HGBUR LARGE (A) 09/09/2015 1412   BILIRUBINUR NEGATIVE 09/09/2015 1412   KETONESUR 15 (A) 09/09/2015 1412   PROTEINUR 30 (A) 09/09/2015 1412   UROBILINOGEN 1.0 04/24/2013 1420   NITRITE POSITIVE (A) 09/09/2015 1412   LEUKOCYTESUR MODERATE (A) 09/09/2015 1412   Sepsis Labs: @LABRCNTIP (procalcitonin:4,lacticidven:4)  ) Recent Results (from the past 240 hour(s))  MRSA PCR Screening     Status: None   Collection Time: 12/14/16  3:23 AM  Result Value Ref Range Status   MRSA by PCR NEGATIVE NEGATIVE Final    Comment:        The GeneXpert MRSA Assay (FDA approved for NASAL specimens only), is one component of a comprehensive MRSA colonization surveillance program. It is not intended to diagnose MRSA infection nor to guide or monitor treatment for MRSA infections.          Radiology Studies: Dg Chest 2 View  Result Date: 12/13/2016 CLINICAL  DATA:  Shortness of breath. EXAM: CHEST  2 VIEW COMPARISON:  Radiograph of September 09, 2015. FINDINGS: Stable cardiomediastinal silhouette. Atherosclerosis of thoracic aorta is noted. No pneumothorax is noted. Moderate bilateral pleural effusions are noted, right greater than left, with probable underlying atelectasis or edema. Bony thorax is unremarkable. IMPRESSION: Interval development of moderate bilateral pleural effusions with probable underlying atelectasis or edema. Electronically Signed   By: Marijo Conception, M.D.   On: 12/13/2016 15:20        Scheduled Meds: . enoxaparin (LOVENOX) injection  30 mg Subcutaneous Daily  . furosemide  40 mg Intravenous Q8H  . levothyroxine  50 mcg Oral QAC  breakfast  . pantoprazole  40 mg Oral Daily  . sodium chloride flush  3 mL Intravenous Q12H   Continuous Infusions: . sodium chloride       LOS: 1 day    Time spent: 20 minutes    Edwin Dada, MD [facility] Pager [phone]  If 7PM-7AM, please contact night-coverage [email or web address] Password [email or web address] 12/14/2016, 4:00 PM

## 2016-12-14 NOTE — Evaluation (Signed)
Physical Therapy Evaluation Patient Details Name: Justin Chambers MRN: [id number] DOB: [birth year] Today's Date: 12/14/2016   History of Present Illness  DEMETRI GOSHERT is a 83 y.o. male with medical history significant of dementia, atrial fibrillation on chronic anticoagulation, CK D, hypothyroidism, anemia admitted for dyspnea.  Clinical Impression  Suspect pt functioning at baseline. Pt poor historian and unaware where pt was living PTA or level of function. Pt with noted dyspnea with ambulation but unable to get a good waveform with pulse ox. Pt however requires 24/7 assist due to dementia and increased falls risk. Acute PT to con't to follow.    Follow Up Recommendations No PT follow up;Supervision/Assistance - 24 hour    Equipment Recommendations  None recommended by PT    Recommendations for Other Services       Precautions / Restrictions Precautions Precautions: Fall Precaution Comments: severe dementia Restrictions Weight Bearing Restrictions: No      Mobility  Bed Mobility Overal bed mobility: Needs Assistance Bed Mobility: Supine to Sit     Supine to sit: Min assist     General bed mobility comments: pt initiated transfer to EOB, directional v/c's to stay on task  Transfers Overall transfer level: Needs assistance Equipment used: 1 person hand held assist Transfers: Sit to/from Stand Sit to Stand: Min assist         General transfer comment: minA to initiate transfer and assist with steadiness  Ambulation/Gait Ambulation/Gait assistance: Min assist Ambulation Distance (Feet): 100 Feet Assistive device: 2 person hand held assist Gait Pattern/deviations: Step-through pattern;Decreased stride length;Narrow base of support;Staggering left;Staggering right Gait velocity: slow Gait velocity interpretation: Below normal speed for age/gender General Gait Details: pt with SOB, pulse ox states 82% on RA however poor waveform,  Stairs             Wheelchair Mobility    Modified Rankin (Stroke Patients Only)       Balance Overall balance assessment:  (pt with lateral sway L/R)                                           Pertinent Vitals/Pain Pain Assessment: No/denies pain    Home Living Family/patient expects to be discharged to:: Other (Comment)                 Additional Comments: unsure of pt's PLOF    Prior Function Level of Independence: Needs assistance (unsure of pt's PLOF)   Gait / Transfers Assistance Needed: unsure  ADL's / Homemaking Assistance Needed: unsure  Comments: pt extremely poor historian, no family present     Hand Dominance   Dominant Hand: Right    Extremity/Trunk Assessment   Upper Extremity Assessment Upper Extremity Assessment: Generalized weakness    Lower Extremity Assessment Lower Extremity Assessment: Generalized weakness    Cervical / Trunk Assessment Cervical / Trunk Assessment: Normal  Communication   Communication: HOH  Cognition Arousal/Alertness: Awake/alert Behavior During Therapy: Flat affect Overall Cognitive Status: History of cognitive impairments - at baseline                                 General Comments: pt with noted dementia however was able to state name, birthdate and [place] but unable to state [place] or current date      General Comments General  comments (skin integrity, edema, etc.): urinary incontinence    Exercises     Assessment/Plan    PT Assessment Patient needs continued PT services  PT Problem List Decreased strength;Decreased range of motion;Decreased activity tolerance;Decreased balance;Decreased mobility;Decreased knowledge of use of DME;Decreased cognition;Decreased safety awareness       PT Treatment Interventions DME instruction;Gait training;Stair training;Functional mobility training;Therapeutic activities;Therapeutic exercise;Balance training    PT Goals (Current goals  can be found in the Care Plan section)  Acute Rehab PT Goals Patient Stated Goal: didn't state PT Goal Formulation: Patient unable to participate in goal setting Time For Goal Achievement: 12/28/16 Potential to Achieve Goals: Good    Frequency Min 2X/week   Barriers to discharge        Co-evaluation               AM-PAC PT "6 Clicks" Daily Activity  Outcome Measure Difficulty turning over in bed (including adjusting bedclothes, sheets and blankets)?: A Lot Difficulty moving from lying on back to sitting on the side of the bed? : A Lot Difficulty sitting down on and standing up from a chair with arms (e.g., wheelchair, bedside commode, etc,.)?: A Lot Help needed moving to and from a bed to chair (including a wheelchair)?: A Lot Help needed walking in hospital room?: A Lot Help needed climbing 3-5 steps with a railing? : A Lot 6 Click Score: 12    End of Session Equipment Utilized During Treatment: Gait belt Activity Tolerance: Patient tolerated treatment well Patient left: in chair;with call bell/phone within reach;with nursing/sitter in room;with chair alarm set Nurse Communication: Mobility status PT Visit Diagnosis: Unsteadiness on feet (R26.81);Difficulty in walking, not elsewhere classified (R26.2)    Time: 1324-4010 PT Time Calculation (min) (ACUTE ONLY): 23 min   Charges:   PT Evaluation $PT Eval Moderate Complexity: 1 Mod PT Treatments $Gait Training: 8-22 mins   PT G Codes:        Kittie Plater, PT, DPT Pager #: [phone] Office #: [phone]   [place] 12/14/2016, 3:41 PM

## 2016-12-15 ENCOUNTER — Inpatient Hospital Stay (HOSPITAL_COMMUNITY): Payer: Medicare Other

## 2016-12-15 DIAGNOSIS — G301 Alzheimer's disease with late onset: Secondary | ICD-10-CM

## 2016-12-15 DIAGNOSIS — I472 Ventricular tachycardia: Secondary | ICD-10-CM

## 2016-12-15 DIAGNOSIS — E43 Unspecified severe protein-calorie malnutrition: Secondary | ICD-10-CM

## 2016-12-15 DIAGNOSIS — F0280 Dementia in other diseases classified elsewhere without behavioral disturbance: Secondary | ICD-10-CM

## 2016-12-15 DIAGNOSIS — I361 Nonrheumatic tricuspid (valve) insufficiency: Secondary | ICD-10-CM

## 2016-12-15 LAB — BASIC METABOLIC PANEL
ANION GAP: 10 (ref 5–15)
BUN: 20 mg/dL (ref 6–20)
CO2: 27 mmol/L (ref 22–32)
Calcium: 8.5 mg/dL — ABNORMAL LOW (ref 8.9–10.3)
Chloride: 100 mmol/L — ABNORMAL LOW (ref 101–111)
Creatinine, Ser: 1.9 mg/dL — ABNORMAL HIGH (ref 0.61–1.24)
GFR calc Af Amer: 34 mL/min — ABNORMAL LOW (ref >60)
GFR, EST NON AFRICAN AMERICAN: 29 mL/min — AB (ref >60)
GLUCOSE: 114 mg/dL — AB (ref 65–99)
POTASSIUM: 3.5 mmol/L (ref 3.5–5.1)
Sodium: 137 mmol/L (ref 135–145)

## 2016-12-15 LAB — ECHOCARDIOGRAM COMPLETE
HEIGHTINCHES: 72 in
Weight: 1984 oz

## 2016-12-15 LAB — MAGNESIUM: Magnesium: 1.7 mg/dL (ref 1.7–2.4)

## 2016-12-15 MED ORDER — FUROSEMIDE 10 MG/ML IJ SOLN: 40.0000 mg | Freq: Every day | INTRAMUSCULAR | Status: DC

## 2016-12-15 MED ORDER — DRONABINOL 2.5 MG PO CAPS
2.5000 mg | ORAL_CAPSULE | Freq: Every day | ORAL | Status: DC
  Administered 2016-12-15 – 2016-12-16 (×2): 2.5 mg via ORAL
  Filled 2016-12-15 (×3): qty 1

## 2016-12-15 MED ORDER — MAGNESIUM SULFATE 2 GM/50ML IV SOLN
2.0000 g | Freq: Once | INTRAVENOUS | Status: AC
Start: 2016-12-15 — End: 2016-12-15
  Administered 2016-12-15: 2 g via INTRAVENOUS
  Filled 2016-12-15: qty 50

## 2016-12-15 MED ORDER — FUROSEMIDE 10 MG/ML IJ SOLN
40.0000 mg | Freq: Two times a day (BID) | INTRAMUSCULAR | Status: AC
  Administered 2016-12-15 – 2016-12-16 (×2): 40 mg via INTRAVENOUS
  Filled 2016-12-15 (×2): qty 4

## 2016-12-15 MED ORDER — POTASSIUM CHLORIDE CRYS ER 20 MEQ PO TBCR
20.0000 meq | EXTENDED_RELEASE_TABLET | Freq: Once | ORAL | Status: AC
Start: 2016-12-15 — End: 2016-12-15
  Administered 2016-12-15: 20 meq via ORAL
  Filled 2016-12-15: qty 1

## 2016-12-15 NOTE — Progress Notes (Signed)
Pt had another episode of NSVT no s/s was 12 beats this time, this happened shortly after Lasix IV was given this am, will continue to monitor, Thanks Arvella Nigh RN.

## 2016-12-15 NOTE — Progress Notes (Signed)
  Echocardiogram 2D Echocardiogram has been performed.  Justin Chambers 12/15/2016, 9:44 AM

## 2016-12-15 NOTE — Progress Notes (Signed)
Pt had another NSVT 20 bt and 17 beat, no s/s, ordered K level which was 3.5, MD ordered K 45meq PO to replace this, and pt did not have any more runs of Vtach after the K given, will continue to monitor, thanks Buckner Malta.

## 2016-12-15 NOTE — Progress Notes (Signed)
PROGRESS NOTE    Justin Chambers  WYO:[id number] DOB: 18-Jun-1923 DOA: 12/13/2016 PCP: Burnard Bunting, MD  Outpatient Specialists: Dr. Claiborne Billings     Brief Narrative:  83 yo M with dementia, Afib, hx AS, CKD baseline Cr 1.9 and hypothyroidism presents with hypoxia from SNF, found to have CHF, with BNP >3000 and effusions on CXR.   Assessment & Plan:  Active Problems:   Atrial fibrillation (HCC)   Alzheimer disease   Aortic stenosis   CHF (congestive heart failure) (HCC)   Fluid overload   Acute respiratory failure with hypoxia (HCC)   Acute probably diastolic CHF In setting of AS, loud murmur, last EF in 2017 55%, and mild LVH.  Doesn't appear from our chart that he has a history of CHF per se.  -2.3L yesterday -Decrease lasix to once daily, goal net negative 1-1.5L -Continue K supplement -Strict I/Os, daily weights, telemetry  -Daily monitoring renal function -Echo still pending -Cardiology recommendations are appreciated  Elevated troponin Trace elevation.  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  Stable  Pleural effusions From CHF -Diuresis as above  Atrial fibrillation CHADS2Vasc 2, not anticoagulated due to risk of falls.    NSVT Noted on monitor overnight, twice.  K supplemented.  Mag slightly low. -Keep Mag>2, K>4      DVT prophylaxis: Lovenox low dose Code Status: FULL Family Communication: None present Disposition Plan: IV diuresis and monitor respiratory status.  Likely will need 1-2 days diuresis depending on echo findings.   Consultants:   Cardiology  Procedures:   Echo    Subjective: Caveat that  history is unreliable given dementia.  He is reportedly impulsive per nursing, fall risk.    Patient reports feeling weak, but breathing okay.  No chest pain.  Objective: Vitals:   12/14/16 1657 12/14/16 1936 12/15/16 0000 12/15/16 0441  BP: 122/78 (!) 146/62 116/78 130/89  Pulse: 70 76 80 70  Resp: 18 18 20 20   Temp: 97.9 F (36.6 C) 98.2 F (36.8 C) 97.9 F (36.6 C) 97.6 F (36.4 C)  TempSrc: Oral Oral Oral Oral  SpO2: 98% 99% 96% 97%  Weight:    56.2 kg (124 lb)  Height:        Intake/Output Summary (Last 24 hours) at 12/15/16 0842 Last data filed at 12/15/16 0700  Gross per 24 hour  Intake              420 ml  Output             3000 ml  Net            -2580 ml   Filed Weights   12/13/16 2345 12/14/16 0653 12/15/16 0441  Weight: 59.8 kg (131 lb 12.8 oz) 59 kg (130 lb) 56.2 kg (124 lb)    Examination:  General exam: Thin elderly Appears calm and comfortable  Respiratory system: Clear to auscultation. Clear at bases. Cardiovascular system: S1 & S2 heard, RRR  Loud SEM. No rubs, gallops or clicks. No pedal edema. Gastrointestinal system: Abdomen is nondistended, soft and nontender. No organomegaly or masses felt. Normal bowel sounds heard. Central nervous system: Alert and oriented to [facility] hospital, not oriented to date. No focal  neurological deficits. Extremities: Symmetric 5 x 5 power. Skin: No rashes, lesions or ulcers Psychiatry: Judgement and insight appear poor, from dementia. Mood & affect appropriate.     Data Reviewed: I have personally reviewed following labs and imaging studies  CBC:  Recent Labs Lab 12/13/16 1547 12/14/16 1231  WBC 6.1 5.6  NEUTROABS 4.4  --   HGB 12.4* 13.9  HCT 36.6* 42.1  MCV 81.5 81.4  PLT 183 347   Basic Metabolic Panel:  Recent Labs Lab 12/13/16 1547 12/14/16 1004 12/15/16 0040 12/15/16 0246  NA 138 137  --  137  K 4.0 4.3  --  3.5  CL 104 103  --  100*  CO2 26 25  --  27  GLUCOSE 89 70  --  114*  BUN 19 19   --  20  CREATININE 1.91* 1.83*  --  1.90*  CALCIUM 8.7* 8.5*  --  8.5*  MG  --  1.7 1.7  --   PHOS  --  4.4  --   --    GFR: Estimated Creatinine Clearance: 20.1 mL/min (A) (by C-G formula based on SCr of 1.9 mg/dL (H)). Liver Function Tests:  Recent Labs Lab 12/14/16 1004  AST 20  ALT 10*  ALKPHOS 76  BILITOT 1.2  PROT 5.3*  ALBUMIN 2.8*   No results for input(s): LIPASE, AMYLASE in the last 168 hours. No results for input(s): AMMONIA in the last 168 hours. Coagulation Profile: No results for input(s): INR, PROTIME in the last 168 hours. Cardiac Enzymes:  Recent Labs Lab 12/13/16 1547 12/13/16 2050 12/14/16 0237 12/14/16 1004  TROPONINI <0.03 0.03* 0.04* 0.03*   BNP (last 3 results) No results for input(s): PROBNP in the last 8760 hours. HbA1C: No results for input(s): HGBA1C in the last 72 hours. CBG: No results for input(s): GLUCAP in the last 168 hours. Lipid Profile: No results for input(s): CHOL, HDL, LDLCALC, TRIG, CHOLHDL, LDLDIRECT in the last 72 hours. Thyroid Function Tests:  Recent Labs  12/14/16 0237  TSH 2.398   Anemia Panel: No results for input(s): VITAMINB12, FOLATE, FERRITIN, TIBC, IRON, RETICCTPCT in the last 72 hours. Urine analysis:    Component Value Date/Time   COLORURINE AMBER (A) 09/09/2015 1412   APPEARANCEUR CLOUDY (A) 09/09/2015 1412   LABSPEC 1.019 09/09/2015 1412   PHURINE 5.5 09/09/2015 1412   GLUCOSEU NEGATIVE 09/09/2015 1412   HGBUR LARGE (A) 09/09/2015 1412   BILIRUBINUR NEGATIVE 09/09/2015 1412   KETONESUR 15 (A) 09/09/2015 1412   PROTEINUR 30 (A) 09/09/2015 1412   UROBILINOGEN 1.0 04/24/2013 1420   NITRITE POSITIVE (A) 09/09/2015 1412   LEUKOCYTESUR MODERATE (A) 09/09/2015 1412   Sepsis Labs: @LABRCNTIP (procalcitonin:4,lacticidven:4)  ) Recent Results (from the past 240 hour(s))  MRSA PCR Screening     Status: None   Collection Time: 12/14/16  3:23 AM  Result Value Ref Range Status   MRSA by PCR NEGATIVE  NEGATIVE Final    Comment:        The GeneXpert MRSA Assay (FDA approved for NASAL specimens only), is one component of a comprehensive MRSA colonization surveillance program. It is not intended to diagnose MRSA infection nor to guide or monitor treatment for MRSA infections.          Radiology Studies: Dg Chest 2 View  Result Date: 12/13/2016 CLINICAL DATA:  Shortness of breath. EXAM: CHEST  2 VIEW COMPARISON:  Radiograph of September 09, 2015. FINDINGS: Stable cardiomediastinal silhouette. Atherosclerosis of thoracic aorta is noted. No pneumothorax  is noted. Moderate bilateral pleural effusions are noted, right greater than left, with probable underlying atelectasis or edema. Bony thorax is unremarkable. IMPRESSION: Interval development of moderate bilateral pleural effusions with probable underlying atelectasis or edema. Electronically Signed   By: Marijo Conception, M.D.   On: 12/13/2016 15:20        Scheduled Meds: . enoxaparin (LOVENOX) injection  30 mg Subcutaneous Daily  . furosemide  40 mg Intravenous Q8H  . levothyroxine  50 mcg Oral QAC breakfast  . pantoprazole  40 mg Oral Daily  . sodium chloride flush  3 mL Intravenous Q12H   Continuous Infusions: . sodium chloride    . magnesium sulfate 1 - 4 g bolus IVPB       LOS: 2 days    Time spent: 20 minutes    Edwin Dada, MD [facility] Pager [phone]  If 7PM-7AM, please contact night-coverage [email or web address] Password [email or web address] 12/15/2016, 8:42 AM

## 2016-12-15 NOTE — Progress Notes (Signed)
Pt had 9 beat run of V-tach no s/s, MD notified, ordered Mg level, which was 1.7, will continue to monitor, thanks Buckner Malta.

## 2016-12-15 NOTE — Progress Notes (Signed)
Progress Note  Patient Name: Justin Chambers Date of Encounter: 12/15/2016  Primary Cardiologist: Dr Claiborne Billings  Subjective   He looks better and is eating.   Inpatient Medications    Scheduled Meds: . enoxaparin (LOVENOX) injection  30 mg Subcutaneous Daily  . [START ON 12/16/2016] furosemide  40 mg Intravenous Daily  . levothyroxine  50 mcg Oral QAC breakfast  . pantoprazole  40 mg Oral Daily  . sodium chloride flush  3 mL Intravenous Q12H   Continuous Infusions: . sodium chloride     PRN Meds: sodium chloride, acetaminophen **OR** acetaminophen, ondansetron **OR** ondansetron (ZOFRAN) IV, polyethylene glycol, sodium chloride flush   Vital Signs    Vitals:   12/14/16 1657 12/14/16 1936 12/15/16 0000 12/15/16 0441  BP: 122/78 (!) 146/62 116/78 130/89  Pulse: 70 76 80 70  Resp: 18 18 20 20   Temp: 97.9 F (36.6 C) 98.2 F (36.8 C) 97.9 F (36.6 C) 97.6 F (36.4 C)  TempSrc: Oral Oral Oral Oral  SpO2: 98% 99% 96% 97%  Weight:    124 lb (56.2 kg)  Height:        Intake/Output Summary (Last 24 hours) at 12/15/16 1052 Last data filed at 12/15/16 0700  Gross per 24 hour  Intake              420 ml  Output             3000 ml  Net            -2580 ml   Filed Weights   12/13/16 2345 12/14/16 0653 12/15/16 0441  Weight: 131 lb 12.8 oz (59.8 kg) 130 lb (59 kg) 124 lb (56.2 kg)   Telemetry    SR, PVCs - Personally Reviewed  Physical Exam   GEN: No acute distress.  Eating.  Neck: No JVD Cardiac: RRR, 4/6 holosystolic murmur, no S2, rubs, or gallops.  Respiratory: Clear to auscultation bilaterally. GI: Soft, nontender, non-distended  MS: No edema; No deformity. Neuro:  Nonfocal  Psych: Normal affect   Labs    Chemistry Recent Labs Lab 12/13/16 1547 12/14/16 1004 12/15/16 0246  NA 138 137 137  K 4.0 4.3 3.5  CL 104 103 100*  CO2 26 25 27   GLUCOSE 89 70 114*  BUN 19 19 20   CREATININE 1.91* 1.83* 1.90*  CALCIUM 8.7* 8.5* 8.5*  PROT  --  5.3*  --     ALBUMIN  --  2.8*  --   AST  --  20  --   ALT  --  10*  --   ALKPHOS  --  76  --   BILITOT  --  1.2  --   GFRNONAA 29* 31* 29*  GFRAA 34* 35* 34*  ANIONGAP 8 9 10      Hematology Recent Labs Lab 12/13/16 1547 12/14/16 1231  WBC 6.1 5.6  RBC 4.49 5.17  HGB 12.4* 13.9  HCT 36.6* 42.1  MCV 81.5 81.4  MCH 27.6 26.9  MCHC 33.9 33.0  RDW 16.5* 16.5*  PLT 183 193    Cardiac Enzymes Recent Labs Lab 12/13/16 1547 12/13/16 2050 12/14/16 0237 12/14/16 1004  TROPONINI <0.03 0.03* 0.04* 0.03*   No results for input(s): TROPIPOC in the last 168 hours.   BNP Recent Labs Lab 12/13/16 1635  BNP 3,017.3*    DDimer No results for input(s): DDIMER in the last 168 hours.   Radiology    Dg Chest 2 View  Result Date: 12/13/2016 CLINICAL  DATA:  Shortness of breath. EXAM: CHEST  2 VIEW COMPARISON:  Radiograph of September 09, 2015. FINDINGS: Stable cardiomediastinal silhouette. Atherosclerosis of thoracic aorta is noted. No pneumothorax is noted. Moderate bilateral pleural effusions are noted, right greater than left, with probable underlying atelectasis or edema. Bony thorax is unremarkable. IMPRESSION: Interval development of moderate bilateral pleural effusions with probable underlying atelectasis or edema. Electronically Signed   By: Marijo Conception, M.D.   On: 12/13/2016 15:20    Cardiac Studies   - Left ventricle: There is a false tendon in the LV apex of no   clinical significance. The cavity size was normal. There was   severe concentric hypertrophy. Systolic function was mildly to   moderately reduced. The estimated ejection fraction was in the   range of 40% to 45%. There is severe hypokinesis of the   inferolateral and inferior myocardium. The study was not   technically sufficient to allow evaluation of LV diastolic   dysfunction due to atrial fibrillation. - Aortic valve: The mean AVG is 72mmHg but calculated AVA is   0.57cm2 c/w low flow low gradient severe AS with  reduced EF.   Valve mobility was restricted. There was severe stenosis. There   was trivial regurgitation. Valve area (VTI): 0.57 cm^2. Valve   area (Vmax): 0.58 cm^2. Valve area (Vmean): 0.55 cm^2. - Mitral valve: Calcified annulus. There was trivial regurgitation. - Right ventricle: The cavity size was mildly dilated. Wall   thickness was normal. - Right atrium: The atrium was severely dilated. - Pulmonic valve: There was trivial regurgitation. - Pulmonary arteries: PA peak pressure: 47 mm Hg (S).  Impressions:  - The right ventricular systolic pressure was increased consistent   with moderate pulmonary hypertension.  Patient Profile     83 y.o. male   Assessment & Plan    1. Acute on chronic diastolic CHF - possibly sec to severe AS and low albumin -  I will continue lasix at 40 mg iv Q12H till tomorrow and plan on discharge in the am.  Echo shows LVEF 40-45%, severe aortic stenosis, moderate pulmonary hypertension.  However with advanced dementia he is not a candidate for any aggressive therapies.   2. Aortic stenosis - based on physical exam and echo severe, However with advanced dementia he is not a candidate for any aggressive therapies.  3. Hypertension - managed by lasix for now  4. Dementia - advanced  5. Severe malnutrition - sec to advanced dementia, he is still able to walk, we will try marinol to improve his appetite.   For questions or updates, please contact [facility] [place] Please consult [email or web address] for contact info under Cardiology/STEMI.      Signed, Ena Dawley, MD  12/15/2016, 10:52 AM

## 2016-12-16 DIAGNOSIS — I5021 Acute systolic (congestive) heart failure: Secondary | ICD-10-CM

## 2016-12-16 DIAGNOSIS — I480 Paroxysmal atrial fibrillation: Secondary | ICD-10-CM

## 2016-12-16 DIAGNOSIS — E43 Unspecified severe protein-calorie malnutrition: Secondary | ICD-10-CM

## 2016-12-16 LAB — BASIC METABOLIC PANEL
Anion gap: 10 (ref 5–15)
BUN: 19 mg/dL (ref 6–20)
CALCIUM: 9.2 mg/dL (ref 8.9–10.3)
CO2: 33 mmol/L — AB (ref 22–32)
CREATININE: 2.09 mg/dL — AB (ref 0.61–1.24)
Chloride: 97 mmol/L — ABNORMAL LOW (ref 101–111)
GFR, EST AFRICAN AMERICAN: 30 mL/min — AB (ref >60)
GFR, EST NON AFRICAN AMERICAN: 26 mL/min — AB (ref >60)
GLUCOSE: 91 mg/dL (ref 65–99)
Potassium: 3.7 mmol/L (ref 3.5–5.1)
Sodium: 140 mmol/L (ref 135–145)

## 2016-12-16 MED ORDER — POTASSIUM CHLORIDE ER 10 MEQ PO TBCR: 10.0000 meq | EXTENDED_RELEASE_TABLET | Freq: Every day | ORAL | 0 refills | Status: AC

## 2016-12-16 MED ORDER — FUROSEMIDE 40 MG PO TABS: 40.0000 mg | ORAL_TABLET | Freq: Every day | ORAL | 11 refills | Status: AC

## 2016-12-16 NOTE — Progress Notes (Signed)
All discharge instructions reviewed in detail with pt's son with understanding verbalized. Report called to [facility] facility aware pt's son will be transporting to facility. No questions or concerns at time of discharge. Pt discharged with son in stable condition.

## 2016-12-16 NOTE — Clinical Social Work Note (Signed)
CSW facilitated patient discharge including contacting patient family and facility to confirm patient discharge plans. Clinical information faxed to facility and family agreeable with plan. Patient's son will transport him back to [facility]. He will be back to the hospital to pick him up around 3:00. RN to call report prior to discharge [phone]).  CSW will sign off for now as social work intervention is no longer needed. Please consult [place] again if new needs arise.  Dayton Scrape, [place]

## 2016-12-16 NOTE — Progress Notes (Signed)
Chart reviewed and discussed with Dr. Loleta Books - unfortunately, not a candidate for aortic valve replacement. Given advancing dementia and severe aortic stenosis, prognosis is felt to be <1 year. Would consider palliative care involvement. Having asymptomatic VT - would not actively pursue b-blocker due to decrease in CO which may symptomatically worsen the patient in the short-term. Creatinine noted to be rising as well today.  Will sign-off. Please call with questions.  Pixie Casino, MD, [place]  Attending Cardiologist  Direct Dial: [phone]  Fax: [phone]  Website:  [email or web address]

## 2016-12-16 NOTE — Progress Notes (Signed)
Dr. Loleta Books notified of 2nd run of V-tach this morning (22 beat). MD to get in contact with family to discuss prognosis/code status. No new orders received at this time. Will continue to monitor.

## 2016-12-16 NOTE — Progress Notes (Addendum)
Dr. Loleta Books notified pt had 21 beat run of v-tach @ 8:20. Asymptomatic. VSS. Will continue to monitor.

## 2016-12-16 NOTE — Progress Notes (Signed)
Telephone report called to receiving facility & report given to [place], [place]. All questions answered.

## 2016-12-16 NOTE — Clinical Social Work Note (Signed)
Clinical Social Work Assessment  Patient Details  Name: Justin Chambers MRN: [id number] Date of Birth: [birth year]  Date of referral:  12/16/16               Reason for consult:  Discharge Planning                Permission sought to share information with:  Facility Sport and exercise psychologist, Family Supports Permission granted to share information::  Yes, Verbal Permission Granted  Name::     Doralee Albino  Agency::  [facility]  Relationship::  Daughter  Contact Information:  [phone]  Housing/Transportation Living arrangements for the past 2 months:  [place] of Information:  Medical Team, Adult Children, Facility Patient Interpreter Needed:  None Criminal Activity/Legal Involvement Pertinent to Current Situation/Hospitalization:  No - Comment as needed Significant Relationships:  Adult Children Lives with:  Facility Resident Do you feel safe going back to the place where you live?  Yes Need for family participation in patient care:  Yes (Comment)  Care giving concerns:  Patient is from [facility] on their memory care unit.   Social Worker assessment / plan:  Per Print production planner, patient is from their memory care unit. Patient oriented to self only. No supports at bedside. CSW called patient's daughter, introduced role, and explained that discharge planning would be discussed. Patient's daughter confirmed that patient is from [facility] on their memory care unit and the plan is for him to return once stable for discharge. Per MD, patient will discharge today. Patient's son will transport by car. No further concerns. CSW encouraged patient's daughter to contact CSW as needed. CSW will continue to follow patient and his daughter for support and facilitate discharge back to [facility] today.  Employment status:  Retired Forensic scientist:  Medicare PT Recommendations:  No Follow Up Information / Referral to community resources:  Other (Comment Required)  (Will return to Memory Care Unit)  Patient/Family's Response to care:  Patient oriented to self only. Patient's daughter agreeable to return to memory care unit. Patient's children supportive and involved in patient's care. Patient's daughter appreciated social work intervention.  Patient/Family's Understanding of and Emotional Response to Diagnosis, Current Treatment, and Prognosis:  Patient oriented to self only. Patient's daughter has a good understanding of the reason for admission and his need to return to [facility]. Patient's daughter appears happy with hospital care.  Emotional Assessment Appearance:  Appears stated age Attitude/Demeanor/Rapport:  Unable to Assess Affect (typically observed):  Unable to Assess Orientation:  Oriented to Self Alcohol / Substance use:  Never Used Psych involvement (Current and /or in the community):  No (Comment)  Discharge Needs  Concerns to be addressed:  Care Coordination Readmission within the last 30 days:  No Current discharge risk:  Cognitively Impaired Barriers to Discharge:  No Barriers Identified   Candie Chroman, LCSW 12/16/2016, 9:54 AM

## 2016-12-16 NOTE — Discharge Summary (Signed)
Physician Discharge Summary  Justin Chambers BJY:[id number] DOB: 03-25-1923 DOA: 12/13/2016  PCP: Burnard Bunting, MD  Admit date: 12/13/2016 Discharge date: 12/16/2016  Admitted From: SNF Memory Care Disposition:  SNF Memory Care  Recommendations for Outpatient Follow-up:  1. Follow up with PCP in 1-2 weeks 2. Please obtain BMP to monitor creatinine on Lasix in 3 days (by Thursday Oct 18) 3. Please consider referral to Palliative Care as an outpatient  Home Health: No Equipment/Devices: None  Discharge Condition: Guarded  CODE STATUS: DO NOT RESUSCITATE Diet recommendation: Cardiac  Brief/Interim Summary: The patient is a 83 year old man with past medical history significant for dementia, Afib not on AC, and hypothyroidism who presents with hypoxia, found to have BNP >3000 and pleural effusions.  Echocardiogram was obtained that showed new EF 40% (from previous 55%) and critical AS.  He was evaluated by Cardiology who recommended IV diuresis, and his oxygenation improved.  On the day of discharge, his creatinine bumped slightly and he was converted to oral Lasix.    With regard to aortic stenosis, he was judged not a surgical nor TAVR candidate.    He did experience brief asymptomatic VT episodes as well during this hospitalization, but BB therapy was contraindicated given his critical AS.  His CODE STATUS was discussed with his HCPOA, family agreed that his wishes would NOT be for invasive procedures of any kind, nor heroic measurse, and he was made DO NOT ATTEMPT RESUSCITATION.  It was discussed with family that he had a prognosis likely <1 year given his aortic stenosis.      Discharge Diagnoses:  Active Problems:   Atrial fibrillation (HCC)   Alzheimer disease   Aortic stenosis   CHF (congestive heart failure) (HCC)   Fluid overload   Acute respiratory failure with hypoxia (HCC)   Severe protein-calorie malnutrition [facility])    Discharge Instructions  Discharge  Instructions    Diet - low sodium heart healthy    Complete by:  As directed    Increase activity slowly    Complete by:  As directed      Allergies as of 12/16/2016      Reactions   Latex    Savaysa [edoxaban] Other (See Comments)   Caused Hgb to drop- adverse reaction, not allergy      Medication List    STOP taking these medications   ciprofloxacin 500 MG tablet Commonly known as:  CIPRO     TAKE these medications   aspirin EC 81 MG tablet Take 81 mg by mouth daily.   docusate sodium 100 MG capsule Commonly known as:  COLACE Take 100 mg by mouth 2 (two) times daily.   Dutasteride-Tamsulosin HCl 0.5-0.4 MG Caps Take 1 capsule by mouth daily.   furosemide 40 MG tablet Commonly known as:  LASIX Take 1 tablet (40 mg total) by mouth daily.   iron polysaccharides 150 MG capsule Commonly known as:  NIFEREX Take 150 mg by mouth 2 (two) times daily.   levothyroxine 50 MCG tablet Commonly known as:  SYNTHROID, LEVOTHROID Take 50 mcg by mouth daily before breakfast.   MULTIVITAMIN ADULTS 50+ Tabs Take 1 tablet by mouth daily.   pantoprazole 40 MG tablet Commonly known as:  PROTONIX Take 1 tablet (40 mg total) by mouth daily.   potassium chloride 10 MEQ tablet Commonly known as:  K-DUR Take 1 tablet (10 mEq total) by mouth daily.   Vitamin D-3 1000 units Caps Take 1,000 Units by mouth daily.  Follow-up Information    Burnard Bunting, MD.   Specialty:  Internal Medicine Contact information: [address] [place] [address] [phone]          Allergies  Allergen Reactions  . Latex   . Savaysa [Edoxaban] Other (See Comments)    Caused Hgb to drop- adverse reaction, not allergy    Consultations:  Cardiology   Procedures/Studies: Dg Chest 2 View  Result Date: 12/13/2016 CLINICAL DATA:  Shortness of breath. EXAM: CHEST  2 VIEW COMPARISON:  Radiograph of September 09, 2015. FINDINGS: Stable cardiomediastinal silhouette. Atherosclerosis  of thoracic aorta is noted. No pneumothorax is noted. Moderate bilateral pleural effusions are noted, right greater than left, with probable underlying atelectasis or edema. Bony thorax is unremarkable. IMPRESSION: Interval development of moderate bilateral pleural effusions with probable underlying atelectasis or edema. Electronically Signed   By: Marijo Conception, M.D.   On: 12/13/2016 15:20   Echocardiogam 10/14 Study Conclusions  - Left ventricle: There is a false tendon in the LV apex of no   clinical significance. The cavity size was normal. There was   severe concentric hypertrophy. Systolic function was mildly to   moderately reduced. The estimated ejection fraction was in the   range of 40% to 45%. There is severe hypokinesis of the   inferolateral and inferior myocardium. The study was not   technically sufficient to allow evaluation of LV diastolic   dysfunction due to atrial fibrillation. - Aortic valve: The mean AVG is 71mmHg but calculated AVA is   0.57cm2 c/w low flow low gradient severe AS with reduced EF.   Valve mobility was restricted. There was severe stenosis. There   was trivial regurgitation. Valve area (VTI): 0.57 cm^2. Valve   area (Vmax): 0.58 cm^2. Valve area (Vmean): 0.55 cm^2. - Mitral valve: Calcified annulus. There was trivial regurgitation. - Right ventricle: The cavity size was mildly dilated. Wall   thickness was normal. - Right atrium: The atrium was severely dilated. - Pulmonic valve: There was trivial regurgitation. - Pulmonary arteries: PA peak pressure: 47 mm Hg (S).  Impressions:  - The right ventricular systolic pressure was increased consistent   with moderate pulmonary hypertension.        Subjective: Feeling well, eating a full breakfast.  No chest pain, palpitaitons, dizziness, weakness, dyspnea.  Discharge Exam: Vitals:   12/16/16 0842 12/16/16 1100  BP: 107/71 109/65  Pulse: 73 76  Resp: 19 18  Temp: (!) 97.5 F (36.4 C)  97.8 F (36.6 C)  SpO2: 94% 99%   Vitals:   12/15/16 1943 12/16/16 0616 12/16/16 0842 12/16/16 1100  BP: (!) 145/73 (!) 146/90 107/71 109/65  Pulse: 67 72 73 76  Resp: 18 18 19 18   Temp: 98.2 F (36.8 C) (!) 97.4 F (36.3 C) (!) 97.5 F (36.4 C) 97.8 F (36.6 C)  TempSrc: Oral Oral Oral Oral  SpO2: 93% 97% 94% 99%  Weight:  56.2 kg (124 lb)    Height:        General: Pt is alert, awake, not in acute distress, thin.   Cardiovascular: RRR, S1/S2 +, loud harsh SEM, no rubs, no gallops Respiratory: CTA bilaterally, no wheezing, no rhonchi Abdominal: Soft, NT, ND, bowel sounds + Extremities: no edema, no cyanosis Neuro: not oriented to place, year   The results of significant diagnostics from this hospitalization (including imaging, microbiology, ancillary and laboratory) are listed below for reference.     Microbiology: Recent Results (from the past 240 hour(s))  MRSA PCR Screening     Status: None   Collection Time: 12/14/16  3:23 AM  Result Value Ref Range Status   MRSA by PCR NEGATIVE NEGATIVE Final    Comment:        The GeneXpert MRSA Assay (FDA approved for NASAL specimens only), is one component of a comprehensive MRSA colonization surveillance program. It is not intended to diagnose MRSA infection nor to guide or monitor treatment for MRSA infections.      Labs: BNP (last 3 results)  Recent Labs  12/13/16 1635  BNP 4,268.3*   Basic Metabolic Panel:  Recent Labs Lab 12/13/16 1547 12/14/16 1004 12/15/16 0040 12/15/16 0246 12/16/16 0541  NA 138 137  --  137 140  K 4.0 4.3  --  3.5 3.7  CL 104 103  --  100* 97*  CO2 26 25  --  27 33*  GLUCOSE 89 70  --  114* 91  BUN 19 19  --  20 19  CREATININE 1.91* 1.83*  --  1.90* 2.09*  CALCIUM 8.7* 8.5*  --  8.5* 9.2  MG  --  1.7 1.7  --   --   PHOS  --  4.4  --   --   --    Liver Function Tests:  Recent Labs Lab 12/14/16 1004  AST 20  ALT 10*  ALKPHOS 76  BILITOT 1.2  PROT 5.3*  ALBUMIN  2.8*   No results for input(s): LIPASE, AMYLASE in the last 168 hours. No results for input(s): AMMONIA in the last 168 hours. CBC:  Recent Labs Lab 12/13/16 1547 12/14/16 1231  WBC 6.1 5.6  NEUTROABS 4.4  --   HGB 12.4* 13.9  HCT 36.6* 42.1  MCV 81.5 81.4  PLT 183 193   Cardiac Enzymes:  Recent Labs Lab 12/13/16 1547 12/13/16 2050 12/14/16 0237 12/14/16 1004  TROPONINI <0.03 0.03* 0.04* 0.03*   BNP: Invalid input(s): POCBNP CBG: No results for input(s): GLUCAP in the last 168 hours. D-Dimer No results for input(s): DDIMER in the last 72 hours. Hgb A1c No results for input(s): HGBA1C in the last 72 hours. Lipid Profile No results for input(s): CHOL, HDL, LDLCALC, TRIG, CHOLHDL, LDLDIRECT in the last 72 hours. Thyroid function studies  Recent Labs  12/14/16 0237  TSH 2.398   Anemia work up No results for input(s): VITAMINB12, FOLATE, FERRITIN, TIBC, IRON, RETICCTPCT in the last 72 hours. Urinalysis    Component Value Date/Time   COLORURINE AMBER (A) 09/09/2015 1412   APPEARANCEUR CLOUDY (A) 09/09/2015 1412   LABSPEC 1.019 09/09/2015 1412   PHURINE 5.5 09/09/2015 1412   GLUCOSEU NEGATIVE 09/09/2015 1412   HGBUR LARGE (A) 09/09/2015 1412   BILIRUBINUR NEGATIVE 09/09/2015 1412   KETONESUR 15 (A) 09/09/2015 1412   PROTEINUR 30 (A) 09/09/2015 1412   UROBILINOGEN 1.0 04/24/2013 1420   NITRITE POSITIVE (A) 09/09/2015 1412   LEUKOCYTESUR MODERATE (A) 09/09/2015 1412   Sepsis Labs Invalid input(s): PROCALCITONIN,  WBC,  LACTICIDVEN Microbiology Recent Results (from the past 240 hour(s))  MRSA PCR Screening     Status: None   Collection Time: 12/14/16  3:23 AM  Result Value Ref Range Status   MRSA by PCR NEGATIVE NEGATIVE Final    Comment:        The GeneXpert MRSA Assay (FDA approved for NASAL specimens only), is one component of a comprehensive MRSA colonization surveillance program. It is not intended to diagnose MRSA infection nor to guide  or monitor treatment for  MRSA infections.      Time coordinating discharge: Over 30 minutes  SIGNED:   Edwin Dada, MD  [facility] 12/16/2016, 1:45 PM   If 7PM-7AM, please contact night-coverage [email or web address] Password [email or web address]

## 2016-12-16 NOTE — Evaluation (Signed)
Occupational Therapy Evaluation Patient Details Name: Justin Chambers MRN: [id number] DOB: August 17, 1923 Today's Date: 12/16/2016    History of Present Illness Justin Chambers is a 82 y.o. male with medical history significant of dementia, atrial fibrillation on chronic anticoagulation, CK D, hypothyroidism, anemia admitted for dyspnea.   Clinical Impression   Pt. Son present during evaluation. Pt. Is at baseline for ADLs and does not need further OT.     Follow Up Recommendations  SNF    Equipment Recommendations  None recommended by OT    Recommendations for Other Services       Precautions / Restrictions Precautions Precautions: Fall Precaution Comments: severe dementia Restrictions Weight Bearing Restrictions: No      Mobility Bed Mobility Overal bed mobility: Needs Assistance Bed Mobility: Sit to Supine     Supine to sit: Supervision Sit to supine: Supervision      Transfers     Transfers: Stand Pivot Transfers Sit to Stand: Min guard Stand pivot transfers: Min guard            Balance                                           ADL either performed or assessed with clinical judgement   ADL Overall ADL's : At baseline                                       General ADL Comments: Pt. is s to Min A with ADLs which is his baseline.     Vision Baseline Vision/History: No visual deficits Patient Visual Report: No change from baseline       Perception     Praxis      Pertinent Vitals/Pain Pain Assessment: No/denies pain     Hand Dominance     Extremity/Trunk Assessment Upper Extremity Assessment Upper Extremity Assessment: Overall WFL for tasks assessed           Communication Communication Communication: HOH   Cognition Arousal/Alertness: Awake/alert Behavior During Therapy: WFL for tasks assessed/performed Overall Cognitive Status: History of cognitive impairments - at baseline                                      General Comments       Exercises     Shoulder Instructions      Home Living Family/patient expects to be discharged to:: Skilled nursing facility                                 Additional Comments: Pt. lives at memory care unit      Prior Functioning/Environment Level of Independence: Needs assistance  Gait / Transfers Assistance Needed:  (pnt amb with walker.) ADL's / Homemaking Assistance Needed:  (Pt. required assist with ADLs secondary to dementia)            OT Problem List: Decreased safety awareness      OT Treatment/Interventions:      OT Goals(Current goals can be found in the care plan section) Acute Rehab OT Goals Patient Stated Goal: go home  OT Frequency:     Barriers to D/C:  Co-evaluation              AM-PAC PT "6 Clicks" Daily Activity     Outcome Measure Help from another person eating Chambers?: None Help from another person taking care of personal grooming?: A Little Help from another person toileting, which includes using toliet, bedpan, or urinal?: A Lot Help from another person bathing (including washing, rinsing, drying)?: A Little Help from another person to put on and taking off regular upper body clothing?: A Little Help from another person to put on and taking off regular lower body clothing?: A Little 6 Click Score: 18   End of Session Equipment Utilized During Treatment: Gait belt;Rolling walker  Activity Tolerance: Patient tolerated treatment well Patient left: in bed;with call bell/phone within reach;with bed alarm set;with family/visitor present  OT Visit Diagnosis: Unsteadiness on feet (R26.81)                Time: 7425-9563 OT Time Calculation (min): 38 min Charges:  OT General Charges $OT Visit: 1 Visit OT Evaluation $OT Eval Moderate Complexity: 1 Mod OT Treatments $Self Care/Home Management : 8-22 mins G-Codes:     6  clicks  Justin Chambers 12/16/2016, 10:26 AM

## 2016-12-16 NOTE — Discharge Instructions (Signed)
Start furosemide 40 mg daily and K 10 mEq daily on Tuesday. Check weight daily. Check BMP for renal function in 3 days. If Cr worsening from discharge (2.0 mg/dL) or weight low, discontinue furosemide. Consult Palliative Care as outpatient.

## 2016-12-16 NOTE — NC FL2 (Signed)
[place] LEVEL OF CARE SCREENING TOOL     IDENTIFICATION  Patient Name: Justin Chambers Birthdate: [birth year] Sex: male Admission Date (Current Location): 12/13/2016  [facility] and [place] Number:  Herbalist and Address:  [facility], [place] [address] [place], [place] [address]      Provider Number: [id number]  Attending Physician Name and Address:  Edwin Dada, *  Relative Name and Phone Number:       Current Level of Care: Hospital Recommended Level of Care: Memory Care Prior Approval Number:    Date Approved/Denied:   PASRR Number:    Discharge Plan: Other (Comment) ([facility])    Current Diagnoses: Patient Active Problem List   Diagnosis Date Noted  . Severe protein-calorie malnutrition ([place])   . CHF (congestive heart failure) ([place]) 12/13/2016  . Fluid overload 12/13/2016  . Acute respiratory failure with hypoxia ([place]) 12/13/2016  . Hematuria, gross   . Sepsis ([place]) 09/09/2015  . Hematuria 09/09/2015  . Hypotension 09/09/2015  . Aortic stenosis 07/29/2015  . Hypothyroidism 07/29/2015  . Anemia due to chronic blood loss 07/28/2015  . DOE (dyspnea on exertion) 07/28/2015  . Blood loss anemia 07/28/2015  . Sick sinus syndrome ([place]) 04/24/2014  . Microcytic anemia 02/17/2014  . Chronic anticoagulation 02/17/2014  . Acute gangrenous cholecystitis 05/18/2013  . Cholecystitis 04/24/2013  . Chronic kidney disease (CKD) stage G3a/A3, moderately decreased glomerular filtration rate (GFR) between 45-59 mL/min/1.73 square meter and albuminuria creatinine ratio greater than 300 mg/g (HCC) 04/24/2013    Class: Chronic  . Atrial fibrillation ([place]) 04/24/2013  . Alzheimer disease 04/24/2013    Orientation RESPIRATION BLADDER Height & Weight     Self, Place  Normal Incontinent Weight: 124 lb (56.2 kg) Height:  6' (182.9 cm)  BEHAVIORAL SYMPTOMS/MOOD NEUROLOGICAL BOWEL NUTRITION STATUS  Other  (Comment) (WDL)  (Alzheimer's) Continent Diet (Low sodium heart healthy)  AMBULATORY STATUS COMMUNICATION OF NEEDS Skin   Limited Assist Verbally  (MASD)                       Personal Care Assistance Level of Assistance  Bathing, Feeding, Dressing Bathing Assistance: Limited assistance Feeding assistance: Limited assistance Dressing Assistance: Limited assistance     Functional Limitations Info  Sight, Hearing, Speech Sight Info: Adequate Hearing Info: Adequate Speech Info: Adequate    SPECIAL CARE FACTORS FREQUENCY                       Contractures Contractures Info: Not present    Additional Factors Info  Code Status, Allergies Code Status Info: DNR Allergies Info: Latex, Savaysa (Edoxaban)           Current Medications (12/16/2016):  This is the current hospital active medication list Current Facility-Administered Medications  Medication Dose Route Frequency Provider Last Rate Last Dose  . 0.9 %  sodium chloride infusion  250 mL Intravenous PRN Doutova, Nyoka Lint, MD      . acetaminophen (TYLENOL) tablet 650 mg  650 mg Oral Q6H PRN Doutova, Anastassia, MD       Or  . acetaminophen (TYLENOL) suppository 650 mg  650 mg Rectal Q6H PRN Doutova, Anastassia, MD      . dronabinol (MARINOL) capsule 2.5 mg  2.5 mg Oral Daily Dorothy Spark, MD   2.5 mg at 12/16/16 0946  . enoxaparin (LOVENOX) injection 30 mg  30 mg Subcutaneous Daily Toy Baker, MD  30 mg at 12/16/16 0946  . levothyroxine (SYNTHROID, LEVOTHROID) tablet 50 mcg  50 mcg Oral QAC breakfast Toy Baker, MD   50 mcg at 12/16/16 0548  . ondansetron (ZOFRAN) tablet 4 mg  4 mg Oral Q6H PRN Doutova, Anastassia, MD       Or  . ondansetron (ZOFRAN) injection 4 mg  4 mg Intravenous Q6H PRN Doutova, Anastassia, MD      . pantoprazole (PROTONIX) EC tablet 40 mg  40 mg Oral Daily Doutova, Anastassia, MD   40 mg at 12/16/16 0946  . polyethylene glycol (MIRALAX / GLYCOLAX) packet 17 g  17 g  Oral Daily PRN Doutova, Anastassia, MD      . sodium chloride flush (NS) 0.9 % injection 3 mL  3 mL Intravenous Q12H Doutova, Anastassia, MD   3 mL at 12/15/16 2240  . sodium chloride flush (NS) 0.9 % injection 3 mL  3 mL Intravenous PRN Toy Baker, MD         Discharge Medications: STOP taking these medications   ciprofloxacin 500 MG tablet Commonly known as:  CIPRO     TAKE these medications   aspirin EC 81 MG tablet Take 81 mg by mouth daily.   docusate sodium 100 MG capsule Commonly known as:  COLACE Take 100 mg by mouth 2 (two) times daily.   Dutasteride-Tamsulosin HCl 0.5-0.4 MG Caps Take 1 capsule by mouth daily.   furosemide 40 MG tablet Commonly known as:  LASIX Take 1 tablet (40 mg total) by mouth daily.   iron polysaccharides 150 MG capsule Commonly known as:  NIFEREX Take 150 mg by mouth 2 (two) times daily.   levothyroxine 50 MCG tablet Commonly known as:  SYNTHROID, LEVOTHROID Take 50 mcg by mouth daily before breakfast.   MULTIVITAMIN ADULTS 50+ Tabs Take 1 tablet by mouth daily.   pantoprazole 40 MG tablet Commonly known as:  PROTONIX Take 1 tablet (40 mg total) by mouth daily.   potassium chloride 10 MEQ tablet Commonly known as:  K-DUR Take 1 tablet (10 mEq total) by mouth daily.   Vitamin D-3 1000 units Caps Take 1,000 Units by mouth daily.     Relevant Imaging Results:  Relevant Lab Results:   Additional Information SS#: [id number]  Candie Chroman, LCSW

## 2016-12-17 DIAGNOSIS — N393 Stress incontinence (female) (male): Secondary | ICD-10-CM | POA: Diagnosis not present

## 2016-12-17 DIAGNOSIS — R2681 Unsteadiness on feet: Secondary | ICD-10-CM | POA: Diagnosis not present

## 2016-12-17 DIAGNOSIS — R41841 Cognitive communication deficit: Secondary | ICD-10-CM | POA: Diagnosis not present

## 2016-12-17 DIAGNOSIS — M6281 Muscle weakness (generalized): Secondary | ICD-10-CM | POA: Diagnosis not present

## 2016-12-17 DIAGNOSIS — R278 Other lack of coordination: Secondary | ICD-10-CM | POA: Diagnosis not present

## 2016-12-18 DIAGNOSIS — R278 Other lack of coordination: Secondary | ICD-10-CM | POA: Diagnosis not present

## 2016-12-18 DIAGNOSIS — M6281 Muscle weakness (generalized): Secondary | ICD-10-CM | POA: Diagnosis not present

## 2016-12-18 DIAGNOSIS — N393 Stress incontinence (female) (male): Secondary | ICD-10-CM | POA: Diagnosis not present

## 2016-12-18 DIAGNOSIS — R41841 Cognitive communication deficit: Secondary | ICD-10-CM | POA: Diagnosis not present

## 2016-12-18 DIAGNOSIS — R2681 Unsteadiness on feet: Secondary | ICD-10-CM | POA: Diagnosis not present

## 2016-12-19 DIAGNOSIS — M6281 Muscle weakness (generalized): Secondary | ICD-10-CM | POA: Diagnosis not present

## 2016-12-19 DIAGNOSIS — I509 Heart failure, unspecified: Secondary | ICD-10-CM | POA: Diagnosis not present

## 2016-12-19 DIAGNOSIS — R278 Other lack of coordination: Secondary | ICD-10-CM | POA: Diagnosis not present

## 2016-12-19 DIAGNOSIS — R2681 Unsteadiness on feet: Secondary | ICD-10-CM | POA: Diagnosis not present

## 2016-12-19 DIAGNOSIS — R41841 Cognitive communication deficit: Secondary | ICD-10-CM | POA: Diagnosis not present

## 2016-12-19 DIAGNOSIS — N393 Stress incontinence (female) (male): Secondary | ICD-10-CM | POA: Diagnosis not present

## 2016-12-23 DIAGNOSIS — R2681 Unsteadiness on feet: Secondary | ICD-10-CM | POA: Diagnosis not present

## 2016-12-23 DIAGNOSIS — M6281 Muscle weakness (generalized): Secondary | ICD-10-CM | POA: Diagnosis not present

## 2016-12-23 DIAGNOSIS — R41841 Cognitive communication deficit: Secondary | ICD-10-CM | POA: Diagnosis not present

## 2016-12-23 DIAGNOSIS — R531 Weakness: Secondary | ICD-10-CM | POA: Diagnosis not present

## 2016-12-23 DIAGNOSIS — N393 Stress incontinence (female) (male): Secondary | ICD-10-CM | POA: Diagnosis not present

## 2016-12-23 DIAGNOSIS — R278 Other lack of coordination: Secondary | ICD-10-CM | POA: Diagnosis not present

## 2016-12-24 DIAGNOSIS — M6281 Muscle weakness (generalized): Secondary | ICD-10-CM | POA: Diagnosis not present

## 2016-12-24 DIAGNOSIS — N393 Stress incontinence (female) (male): Secondary | ICD-10-CM | POA: Diagnosis not present

## 2016-12-24 DIAGNOSIS — R278 Other lack of coordination: Secondary | ICD-10-CM | POA: Diagnosis not present

## 2016-12-24 DIAGNOSIS — R41841 Cognitive communication deficit: Secondary | ICD-10-CM | POA: Diagnosis not present

## 2016-12-24 DIAGNOSIS — R2681 Unsteadiness on feet: Secondary | ICD-10-CM | POA: Diagnosis not present

## 2016-12-26 DIAGNOSIS — R2681 Unsteadiness on feet: Secondary | ICD-10-CM | POA: Diagnosis not present

## 2016-12-26 DIAGNOSIS — R278 Other lack of coordination: Secondary | ICD-10-CM | POA: Diagnosis not present

## 2016-12-26 DIAGNOSIS — N393 Stress incontinence (female) (male): Secondary | ICD-10-CM | POA: Diagnosis not present

## 2016-12-26 DIAGNOSIS — M6281 Muscle weakness (generalized): Secondary | ICD-10-CM | POA: Diagnosis not present

## 2016-12-26 DIAGNOSIS — R41841 Cognitive communication deficit: Secondary | ICD-10-CM | POA: Diagnosis not present

## 2016-12-30 DIAGNOSIS — N393 Stress incontinence (female) (male): Secondary | ICD-10-CM | POA: Diagnosis not present

## 2016-12-30 DIAGNOSIS — I509 Heart failure, unspecified: Secondary | ICD-10-CM | POA: Diagnosis not present

## 2016-12-30 DIAGNOSIS — R278 Other lack of coordination: Secondary | ICD-10-CM | POA: Diagnosis not present

## 2016-12-30 DIAGNOSIS — Z681 Body mass index (BMI) 19 or less, adult: Secondary | ICD-10-CM | POA: Diagnosis not present

## 2016-12-30 DIAGNOSIS — M6281 Muscle weakness (generalized): Secondary | ICD-10-CM | POA: Diagnosis not present

## 2016-12-30 DIAGNOSIS — R2681 Unsteadiness on feet: Secondary | ICD-10-CM | POA: Diagnosis not present

## 2016-12-30 DIAGNOSIS — R41841 Cognitive communication deficit: Secondary | ICD-10-CM | POA: Diagnosis not present

## 2016-12-31 DIAGNOSIS — R278 Other lack of coordination: Secondary | ICD-10-CM | POA: Diagnosis not present

## 2016-12-31 DIAGNOSIS — M6281 Muscle weakness (generalized): Secondary | ICD-10-CM | POA: Diagnosis not present

## 2016-12-31 DIAGNOSIS — R2681 Unsteadiness on feet: Secondary | ICD-10-CM | POA: Diagnosis not present

## 2016-12-31 DIAGNOSIS — R41841 Cognitive communication deficit: Secondary | ICD-10-CM | POA: Diagnosis not present

## 2016-12-31 DIAGNOSIS — N393 Stress incontinence (female) (male): Secondary | ICD-10-CM | POA: Diagnosis not present

## 2017-01-01 DIAGNOSIS — R278 Other lack of coordination: Secondary | ICD-10-CM | POA: Diagnosis not present

## 2017-01-01 DIAGNOSIS — R2681 Unsteadiness on feet: Secondary | ICD-10-CM | POA: Diagnosis not present

## 2017-01-01 DIAGNOSIS — M6281 Muscle weakness (generalized): Secondary | ICD-10-CM | POA: Diagnosis not present

## 2017-01-01 DIAGNOSIS — N393 Stress incontinence (female) (male): Secondary | ICD-10-CM | POA: Diagnosis not present

## 2017-01-01 DIAGNOSIS — R41841 Cognitive communication deficit: Secondary | ICD-10-CM | POA: Diagnosis not present

## 2017-01-06 DIAGNOSIS — N393 Stress incontinence (female) (male): Secondary | ICD-10-CM | POA: Diagnosis not present

## 2017-01-06 DIAGNOSIS — R278 Other lack of coordination: Secondary | ICD-10-CM | POA: Diagnosis not present

## 2017-01-06 DIAGNOSIS — M6281 Muscle weakness (generalized): Secondary | ICD-10-CM | POA: Diagnosis not present

## 2017-01-06 DIAGNOSIS — R2681 Unsteadiness on feet: Secondary | ICD-10-CM | POA: Diagnosis not present

## 2017-01-07 DIAGNOSIS — R2681 Unsteadiness on feet: Secondary | ICD-10-CM | POA: Diagnosis not present

## 2017-01-07 DIAGNOSIS — M6281 Muscle weakness (generalized): Secondary | ICD-10-CM | POA: Diagnosis not present

## 2017-01-07 DIAGNOSIS — N393 Stress incontinence (female) (male): Secondary | ICD-10-CM | POA: Diagnosis not present

## 2017-01-07 DIAGNOSIS — R278 Other lack of coordination: Secondary | ICD-10-CM | POA: Diagnosis not present

## 2017-01-10 DIAGNOSIS — R278 Other lack of coordination: Secondary | ICD-10-CM | POA: Diagnosis not present

## 2017-01-10 DIAGNOSIS — N393 Stress incontinence (female) (male): Secondary | ICD-10-CM | POA: Diagnosis not present

## 2017-01-10 DIAGNOSIS — R2681 Unsteadiness on feet: Secondary | ICD-10-CM | POA: Diagnosis not present

## 2017-01-10 DIAGNOSIS — M6281 Muscle weakness (generalized): Secondary | ICD-10-CM | POA: Diagnosis not present

## 2017-01-14 DIAGNOSIS — M6281 Muscle weakness (generalized): Secondary | ICD-10-CM | POA: Diagnosis not present

## 2017-01-14 DIAGNOSIS — R278 Other lack of coordination: Secondary | ICD-10-CM | POA: Diagnosis not present

## 2017-01-14 DIAGNOSIS — R2681 Unsteadiness on feet: Secondary | ICD-10-CM | POA: Diagnosis not present

## 2017-01-14 DIAGNOSIS — N393 Stress incontinence (female) (male): Secondary | ICD-10-CM | POA: Diagnosis not present

## 2017-01-15 DIAGNOSIS — E46 Unspecified protein-calorie malnutrition: Secondary | ICD-10-CM | POA: Diagnosis not present

## 2017-01-15 DIAGNOSIS — I482 Chronic atrial fibrillation: Secondary | ICD-10-CM | POA: Diagnosis not present

## 2017-01-15 DIAGNOSIS — J9600 Acute respiratory failure, unspecified whether with hypoxia or hypercapnia: Secondary | ICD-10-CM | POA: Diagnosis not present

## 2017-01-15 DIAGNOSIS — I350 Nonrheumatic aortic (valve) stenosis: Secondary | ICD-10-CM | POA: Diagnosis not present

## 2017-01-15 DIAGNOSIS — I509 Heart failure, unspecified: Secondary | ICD-10-CM | POA: Diagnosis not present

## 2017-01-15 DIAGNOSIS — E8770 Fluid overload, unspecified: Secondary | ICD-10-CM | POA: Diagnosis not present

## 2017-01-15 DIAGNOSIS — R413 Other amnesia: Secondary | ICD-10-CM | POA: Diagnosis not present

## 2017-01-15 DIAGNOSIS — Z681 Body mass index (BMI) 19 or less, adult: Secondary | ICD-10-CM | POA: Diagnosis not present

## 2017-01-16 DIAGNOSIS — R278 Other lack of coordination: Secondary | ICD-10-CM | POA: Diagnosis not present

## 2017-01-16 DIAGNOSIS — R2681 Unsteadiness on feet: Secondary | ICD-10-CM | POA: Diagnosis not present

## 2017-01-16 DIAGNOSIS — N393 Stress incontinence (female) (male): Secondary | ICD-10-CM | POA: Diagnosis not present

## 2017-01-16 DIAGNOSIS — M6281 Muscle weakness (generalized): Secondary | ICD-10-CM | POA: Diagnosis not present

## 2017-01-21 DIAGNOSIS — M6281 Muscle weakness (generalized): Secondary | ICD-10-CM | POA: Diagnosis not present

## 2017-01-21 DIAGNOSIS — R2681 Unsteadiness on feet: Secondary | ICD-10-CM | POA: Diagnosis not present

## 2017-01-21 DIAGNOSIS — R278 Other lack of coordination: Secondary | ICD-10-CM | POA: Diagnosis not present

## 2017-01-21 DIAGNOSIS — N393 Stress incontinence (female) (male): Secondary | ICD-10-CM | POA: Diagnosis not present

## 2017-01-22 DIAGNOSIS — M6281 Muscle weakness (generalized): Secondary | ICD-10-CM | POA: Diagnosis not present

## 2017-01-22 DIAGNOSIS — R278 Other lack of coordination: Secondary | ICD-10-CM | POA: Diagnosis not present

## 2017-01-22 DIAGNOSIS — N393 Stress incontinence (female) (male): Secondary | ICD-10-CM | POA: Diagnosis not present

## 2017-01-22 DIAGNOSIS — R2681 Unsteadiness on feet: Secondary | ICD-10-CM | POA: Diagnosis not present

## 2017-01-28 DIAGNOSIS — R2681 Unsteadiness on feet: Secondary | ICD-10-CM | POA: Diagnosis not present

## 2017-01-28 DIAGNOSIS — N393 Stress incontinence (female) (male): Secondary | ICD-10-CM | POA: Diagnosis not present

## 2017-01-28 DIAGNOSIS — M6281 Muscle weakness (generalized): Secondary | ICD-10-CM | POA: Diagnosis not present

## 2017-01-28 DIAGNOSIS — R278 Other lack of coordination: Secondary | ICD-10-CM | POA: Diagnosis not present

## 2017-01-30 DIAGNOSIS — M6281 Muscle weakness (generalized): Secondary | ICD-10-CM | POA: Diagnosis not present

## 2017-01-30 DIAGNOSIS — N393 Stress incontinence (female) (male): Secondary | ICD-10-CM | POA: Diagnosis not present

## 2017-01-30 DIAGNOSIS — R278 Other lack of coordination: Secondary | ICD-10-CM | POA: Diagnosis not present

## 2017-01-30 DIAGNOSIS — R2681 Unsteadiness on feet: Secondary | ICD-10-CM | POA: Diagnosis not present

## 2017-02-04 DIAGNOSIS — N393 Stress incontinence (female) (male): Secondary | ICD-10-CM | POA: Diagnosis not present

## 2017-02-04 DIAGNOSIS — M6281 Muscle weakness (generalized): Secondary | ICD-10-CM | POA: Diagnosis not present

## 2017-02-04 DIAGNOSIS — R2681 Unsteadiness on feet: Secondary | ICD-10-CM | POA: Diagnosis not present

## 2017-02-04 DIAGNOSIS — R278 Other lack of coordination: Secondary | ICD-10-CM | POA: Diagnosis not present

## 2017-02-05 DIAGNOSIS — M6281 Muscle weakness (generalized): Secondary | ICD-10-CM | POA: Diagnosis not present

## 2017-02-05 DIAGNOSIS — R2681 Unsteadiness on feet: Secondary | ICD-10-CM | POA: Diagnosis not present

## 2017-02-05 DIAGNOSIS — N393 Stress incontinence (female) (male): Secondary | ICD-10-CM | POA: Diagnosis not present

## 2017-02-05 DIAGNOSIS — R278 Other lack of coordination: Secondary | ICD-10-CM | POA: Diagnosis not present

## 2017-02-07 DIAGNOSIS — M6281 Muscle weakness (generalized): Secondary | ICD-10-CM | POA: Diagnosis not present

## 2017-02-07 DIAGNOSIS — R278 Other lack of coordination: Secondary | ICD-10-CM | POA: Diagnosis not present

## 2017-02-07 DIAGNOSIS — N393 Stress incontinence (female) (male): Secondary | ICD-10-CM | POA: Diagnosis not present

## 2017-02-07 DIAGNOSIS — R2681 Unsteadiness on feet: Secondary | ICD-10-CM | POA: Diagnosis not present

## 2017-02-24 DIAGNOSIS — R531 Weakness: Secondary | ICD-10-CM | POA: Diagnosis not present

## 2017-03-17 ENCOUNTER — Emergency Department (HOSPITAL_COMMUNITY): Payer: Medicare Other

## 2017-03-17 ENCOUNTER — Observation Stay (HOSPITAL_COMMUNITY)
Admission: EM | Admit: 2017-03-17 | Discharge: 2017-03-19 | Disposition: A | Discharge status: Home / Self Care | Payer: Medicare Other | Attending: Family Medicine | Admitting: Family Medicine

## 2017-03-17 ENCOUNTER — Encounter (HOSPITAL_COMMUNITY): Payer: Self-pay | Admitting: Emergency Medicine

## 2017-03-17 DIAGNOSIS — Z91040 Latex allergy status: Secondary | ICD-10-CM | POA: Insufficient documentation

## 2017-03-17 DIAGNOSIS — I495 Sick sinus syndrome: Secondary | ICD-10-CM | POA: Diagnosis not present

## 2017-03-17 DIAGNOSIS — S8991XA Unspecified injury of right lower leg, initial encounter: Secondary | ICD-10-CM | POA: Diagnosis not present

## 2017-03-17 DIAGNOSIS — M79604 Pain in right leg: Secondary | ICD-10-CM | POA: Diagnosis not present

## 2017-03-17 DIAGNOSIS — Z79890 Hormone replacement therapy (postmenopausal): Secondary | ICD-10-CM | POA: Insufficient documentation

## 2017-03-17 DIAGNOSIS — Z8711 Personal history of peptic ulcer disease: Secondary | ICD-10-CM | POA: Insufficient documentation

## 2017-03-17 DIAGNOSIS — Z7982 Long term (current) use of aspirin: Secondary | ICD-10-CM | POA: Diagnosis not present

## 2017-03-17 DIAGNOSIS — Z903 Acquired absence of stomach [part of]: Secondary | ICD-10-CM | POA: Diagnosis not present

## 2017-03-17 DIAGNOSIS — W19XXXA Unspecified fall, initial encounter: Secondary | ICD-10-CM | POA: Insufficient documentation

## 2017-03-17 DIAGNOSIS — M25551 Pain in right hip: Secondary | ICD-10-CM | POA: Diagnosis present

## 2017-03-17 DIAGNOSIS — Z9889 Other specified postprocedural states: Secondary | ICD-10-CM | POA: Insufficient documentation

## 2017-03-17 DIAGNOSIS — S299XXA Unspecified injury of thorax, initial encounter: Secondary | ICD-10-CM | POA: Diagnosis not present

## 2017-03-17 DIAGNOSIS — I509 Heart failure, unspecified: Secondary | ICD-10-CM | POA: Diagnosis not present

## 2017-03-17 DIAGNOSIS — M160 Bilateral primary osteoarthritis of hip: Secondary | ICD-10-CM | POA: Diagnosis not present

## 2017-03-17 DIAGNOSIS — I350 Nonrheumatic aortic (valve) stenosis: Secondary | ICD-10-CM | POA: Insufficient documentation

## 2017-03-17 DIAGNOSIS — I4891 Unspecified atrial fibrillation: Secondary | ICD-10-CM | POA: Diagnosis present

## 2017-03-17 DIAGNOSIS — N183 Chronic kidney disease, stage 3 (moderate): Secondary | ICD-10-CM

## 2017-03-17 DIAGNOSIS — Z66 Do not resuscitate: Secondary | ICD-10-CM | POA: Insufficient documentation

## 2017-03-17 DIAGNOSIS — G9389 Other specified disorders of brain: Secondary | ICD-10-CM | POA: Insufficient documentation

## 2017-03-17 DIAGNOSIS — N185 Chronic kidney disease, stage 5: Secondary | ICD-10-CM | POA: Insufficient documentation

## 2017-03-17 DIAGNOSIS — S72144A Nondisplaced intertrochanteric fracture of right femur, initial encounter for closed fracture: Secondary | ICD-10-CM | POA: Diagnosis not present

## 2017-03-17 DIAGNOSIS — G309 Alzheimer's disease, unspecified: Secondary | ICD-10-CM | POA: Insufficient documentation

## 2017-03-17 DIAGNOSIS — F0280 Dementia in other diseases classified elsewhere without behavioral disturbance: Secondary | ICD-10-CM | POA: Insufficient documentation

## 2017-03-17 DIAGNOSIS — S3282XA Multiple fractures of pelvis without disruption of pelvic ring, initial encounter for closed fracture: Secondary | ICD-10-CM | POA: Diagnosis not present

## 2017-03-17 DIAGNOSIS — E039 Hypothyroidism, unspecified: Secondary | ICD-10-CM | POA: Diagnosis not present

## 2017-03-17 DIAGNOSIS — E43 Unspecified severe protein-calorie malnutrition: Secondary | ICD-10-CM | POA: Insufficient documentation

## 2017-03-17 DIAGNOSIS — D649 Anemia, unspecified: Secondary | ICD-10-CM | POA: Insufficient documentation

## 2017-03-17 DIAGNOSIS — I700 Atherosclerosis of aorta: Secondary | ICD-10-CM | POA: Insufficient documentation

## 2017-03-17 DIAGNOSIS — I429 Cardiomyopathy, unspecified: Secondary | ICD-10-CM | POA: Insufficient documentation

## 2017-03-17 DIAGNOSIS — S329XXA Fracture of unspecified parts of lumbosacral spine and pelvis, initial encounter for closed fracture: Secondary | ICD-10-CM | POA: Diagnosis not present

## 2017-03-17 DIAGNOSIS — M1711 Unilateral primary osteoarthritis, right knee: Secondary | ICD-10-CM | POA: Insufficient documentation

## 2017-03-17 DIAGNOSIS — S79921A Unspecified injury of right thigh, initial encounter: Secondary | ICD-10-CM | POA: Diagnosis not present

## 2017-03-17 DIAGNOSIS — Z79899 Other long term (current) drug therapy: Secondary | ICD-10-CM | POA: Diagnosis not present

## 2017-03-17 DIAGNOSIS — N1831 Chronic kidney disease, stage 3a: Secondary | ICD-10-CM | POA: Diagnosis present

## 2017-03-17 DIAGNOSIS — N400 Enlarged prostate without lower urinary tract symptoms: Secondary | ICD-10-CM | POA: Insufficient documentation

## 2017-03-17 DIAGNOSIS — M8580 Other specified disorders of bone density and structure, unspecified site: Secondary | ICD-10-CM | POA: Diagnosis not present

## 2017-03-17 DIAGNOSIS — T148XXA Other injury of unspecified body region, initial encounter: Secondary | ICD-10-CM | POA: Diagnosis not present

## 2017-03-17 DIAGNOSIS — S0990XA Unspecified injury of head, initial encounter: Secondary | ICD-10-CM | POA: Diagnosis not present

## 2017-03-17 DIAGNOSIS — S79911A Unspecified injury of right hip, initial encounter: Secondary | ICD-10-CM | POA: Diagnosis not present

## 2017-03-17 DIAGNOSIS — S199XXA Unspecified injury of neck, initial encounter: Secondary | ICD-10-CM | POA: Diagnosis not present

## 2017-03-17 DIAGNOSIS — Z888 Allergy status to other drugs, medicaments and biological substances status: Secondary | ICD-10-CM | POA: Insufficient documentation

## 2017-03-17 DIAGNOSIS — R52 Pain, unspecified: Secondary | ICD-10-CM

## 2017-03-17 DIAGNOSIS — Z9049 Acquired absence of other specified parts of digestive tract: Secondary | ICD-10-CM | POA: Insufficient documentation

## 2017-03-17 DIAGNOSIS — S3993XA Unspecified injury of pelvis, initial encounter: Secondary | ICD-10-CM | POA: Diagnosis not present

## 2017-03-17 DIAGNOSIS — S81819A Laceration without foreign body, unspecified lower leg, initial encounter: Secondary | ICD-10-CM | POA: Diagnosis not present

## 2017-03-17 LAB — CBC WITH DIFFERENTIAL/PLATELET
BASOS PCT: 0 %
Basophils Absolute: 0 10*3/uL (ref 0.0–0.1)
EOS ABS: 0 10*3/uL (ref 0.0–0.7)
EOS PCT: 0 %
HCT: 38.4 % — ABNORMAL LOW (ref 39.0–52.0)
Hemoglobin: 12.7 g/dL — ABNORMAL LOW (ref 13.0–17.0)
Lymphocytes Relative: 9 %
Lymphs Abs: 1 10*3/uL (ref 0.7–4.0)
MCH: 28.3 pg (ref 26.0–34.0)
MCHC: 33.1 g/dL (ref 30.0–36.0)
MCV: 85.5 fL (ref 78.0–100.0)
Monocytes Absolute: 0.8 10*3/uL (ref 0.1–1.0)
Monocytes Relative: 7 %
NEUTROS ABS: 8.9 10*3/uL — AB (ref 1.7–7.7)
Neutrophils Relative %: 84 %
Platelets: 195 10*3/uL (ref 150–400)
RBC: 4.49 MIL/uL (ref 4.22–5.81)
RDW: 16 % — ABNORMAL HIGH (ref 11.5–15.5)
WBC: 10.7 10*3/uL — ABNORMAL HIGH (ref 4.0–10.5)

## 2017-03-17 LAB — COMPREHENSIVE METABOLIC PANEL
ALK PHOS: 68 U/L (ref 38–126)
ALT: 20 U/L (ref 17–63)
ANION GAP: 11 (ref 5–15)
AST: 29 U/L (ref 15–41)
Albumin: 3 g/dL — ABNORMAL LOW (ref 3.5–5.0)
BILIRUBIN TOTAL: 1.1 mg/dL (ref 0.3–1.2)
BUN: 36 mg/dL — ABNORMAL HIGH (ref 6–20)
CALCIUM: 8.8 mg/dL — AB (ref 8.9–10.3)
CO2: 26 mmol/L (ref 22–32)
Chloride: 100 mmol/L — ABNORMAL LOW (ref 101–111)
Creatinine, Ser: 2.21 mg/dL — ABNORMAL HIGH (ref 0.61–1.24)
GFR, EST AFRICAN AMERICAN: 28 mL/min — AB (ref >60)
GFR, EST NON AFRICAN AMERICAN: 24 mL/min — AB (ref >60)
Glucose, Bld: 153 mg/dL — ABNORMAL HIGH (ref 65–99)
Potassium: 4.5 mmol/L (ref 3.5–5.1)
SODIUM: 137 mmol/L (ref 135–145)
TOTAL PROTEIN: 5.6 g/dL — AB (ref 6.5–8.1)

## 2017-03-17 NOTE — ED Triage Notes (Signed)
Per EMS, pt from [facility] found in his room. Unwitnessed fall 10-15 min downtime. Pt reports he was getting up to use the restroom, lost his balance and fell. Usually ambulates with walker. Denies LOC and denies hitting head. Pt reports falling onto rt hip and tenderness on palpation to a spot on rt hip. No shortening/rotation of leg. Pt not on blood thinners. Pt denies pain at this time. A&O x3, disoriented to year.   EMS VS BP 100/64, P 108 with hx afib, R 16, SpO2 97% room air, CBG 195.

## 2017-03-17 NOTE — ED Provider Notes (Signed)
[facility] EMERGENCY DEPARTMENT Provider Note   CSN: [id number] Arrival date & time: 03/17/17  2215     History   Chief Complaint Chief Complaint  Patient presents with  . Fall    HPI Justin Chambers is a 84 y.o. male.  Patient had unwitnessed fall when he attempted to ambulate to rest room.  He was found down less than 10 minutes after being seen in bed.  Complains of right hip pain, which started when he fell the previous night.  He was unable to ambulate today prior to falling tonight.   The history is provided by the nursing home and the patient.  Fall  This is a new problem. The current episode started less than 1 hour ago. The problem has not changed since onset.Pertinent negatives include no chest pain, no abdominal pain, no headaches and no shortness of breath. The symptoms are aggravated by standing and walking. He has tried nothing for the symptoms.    Past Medical History:  Diagnosis Date  . Alzheimer disease   . Anemia due to chronic blood loss 07/2015  . Atrial fibrillation ([place])   . Cholecystitis   . Chronic kidney disease   . CKD (chronic kidney disease)   . Dementia   . DOE (dyspnea on exertion)   . Fracture    right leg(casted- high school), collar bone(child)  . Hypothyroidism   . Transfusion history    in past ? when    Patient Active Problem List   Diagnosis Date Noted  . Severe protein-calorie malnutrition ([place])   . CHF (congestive heart failure) ([place]) 12/13/2016  . Fluid overload 12/13/2016  . Acute respiratory failure with hypoxia ([place]) 12/13/2016  . Hematuria, gross   . Sepsis ([place]) 09/09/2015  . Hematuria 09/09/2015  . Hypotension 09/09/2015  . Aortic stenosis 07/29/2015  . Hypothyroidism 07/29/2015  . Anemia due to chronic blood loss 07/28/2015  . DOE (dyspnea on exertion) 07/28/2015  . Blood loss anemia 07/28/2015  . Sick sinus syndrome ([place]) 04/24/2014  . Microcytic anemia 02/17/2014  . Chronic anticoagulation  02/17/2014  . Acute gangrenous cholecystitis 05/18/2013  . Cholecystitis 04/24/2013  . Chronic kidney disease (CKD) stage G3a/A3, moderately decreased glomerular filtration rate (GFR) between 45-59 mL/min/1.73 square meter and albuminuria creatinine ratio greater than 300 mg/g (HCC) 04/24/2013    Class: Chronic  . Atrial fibrillation ([place]) 04/24/2013  . Alzheimer disease 04/24/2013    Past Surgical History:  Procedure Laterality Date  . ABDOMINAL SURGERY     part of stomach removed  . CHOLECYSTECTOMY N/A 04/27/2013   Procedure: LAPAROSCOPIC CHOLECYSTECTOMY WITH INTRAOPERATIVE CHOLANGIOGRAM;  Surgeon: Imogene Burn. [name], MD;  Location: [facility];  Service: General;  Laterality: N/A;  . ESOPHAGOGASTRODUODENOSCOPY N/A 07/30/2015   Procedure: ESOPHAGOGASTRODUODENOSCOPY (EGD);  Surgeon: Ronald Lobo, MD;  Location: [facility] ENDOSCOPY;  Service: Endoscopy;  Laterality: N/A;  Pediatric UPPER endoscope, please  . LAPAROSCOPIC LYSIS OF ADHESIONS N/A 04/27/2013   Procedure: LAPAROSCOPIC LYSIS OF ADHESIONS;  Surgeon: Imogene Burn. [name], MD;  Location: [facility] OR;  Service: General;  Laterality: N/A;  . PARTIAL GASTRECTOMY Bilateral   . TONSILLECTOMY         Home Medications    Prior to Admission medications   Medication Sig Start Date End Date Taking? Authorizing Provider  aspirin EC 81 MG tablet Take 81 mg by mouth daily.    [provider]  Cholecalciferol (VITAMIN D-3) 1000 UNITS CAPS Take 1,000 Units by mouth daily.     [provider]  docusate sodium (COLACE) 100 MG capsule Take 100 mg by mouth 2 (two) times daily.    [provider]  Dutasteride-Tamsulosin HCl 0.5-0.4 MG CAPS Take 1 capsule by mouth daily.    [provider]  furosemide (LASIX) 40 MG tablet Take 1 tablet (40 mg total) by mouth daily. 12/16/16 12/16/17  Danford, Suann Larry, MD  iron polysaccharides (NIFEREX) 150 MG capsule Take 150 mg by mouth 2 (two) times daily.    [provider]    levothyroxine (SYNTHROID, LEVOTHROID) 50 MCG tablet Take 50 mcg by mouth daily before breakfast.  01/28/14   [provider]  Multiple Vitamins-Minerals (MULTIVITAMIN ADULTS 50+) TABS Take 1 tablet by mouth daily.    [provider]  pantoprazole (PROTONIX) 40 MG tablet Take 1 tablet (40 mg total) by mouth daily. 07/31/15   Rai, Ripudeep K, MD  potassium chloride (K-DUR) 10 MEQ tablet Take 1 tablet (10 mEq total) by mouth daily. 12/16/16   Danford, Suann Larry, MD    Family History Family History  Problem Relation Age of Onset  . Diabetes Neg Hx   . CAD Neg Hx     Social History Social History   Tobacco Use  . Smoking status: Never Smoker  . Smokeless tobacco: Never Used  Substance Use Topics  . Alcohol use: No  . Drug use: No     Allergies   Latex and Savaysa [edoxaban]   Review of Systems Review of Systems  Unable to perform ROS: Dementia  Respiratory: Negative for shortness of breath.   Cardiovascular: Negative for chest pain.  Gastrointestinal: Negative for abdominal pain.  Neurological: Negative for headaches.     Physical Exam Updated Vital Signs BP 106/88   Pulse (!) 106   Temp 98 F (36.7 C) (Oral)   Resp 16   Ht 5\' 9"  (1.753 m)   Wt 61.2 kg (135 lb)   SpO2 96%   BMI 19.94 kg/m   Physical Exam  Constitutional: He appears well-developed and well-nourished.  HENT:  Head: Normocephalic and atraumatic.  Eyes: Conjunctivae and EOM are normal.  Neck:  Soft collar wrap in place.  Cardiovascular: Normal rate.  Irregular rhythm  Pulmonary/Chest: Effort normal and breath sounds normal.  Abdominal: Soft. There is no tenderness.  Musculoskeletal: He exhibits no edema.  Neurological: He is alert.  Oriented to self and place  Skin: Skin is warm and dry.  Psychiatric: He has a normal mood and affect.  Nursing note and vitals reviewed.    ED Treatments / Results  Labs (all labs ordered are listed, but only abnormal results are  displayed) Labs Reviewed  CBC WITH DIFFERENTIAL/PLATELET  COMPREHENSIVE METABOLIC PANEL  URINALYSIS, ROUTINE W REFLEX MICROSCOPIC    EKG  EKG Interpretation None       Radiology No results found.  Procedures Procedures (including critical care time)  Medications Ordered in ED Medications - No data to display   Initial Impression / Assessment and Plan / ED Course  I have reviewed the triage vital signs and the nursing notes.  Pertinent labs & imaging results that were available during my care of the patient were reviewed by me and considered in my medical decision making (see chart for details).     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 fib, CKD, hypothyroidism who presents for evaluation after falling twice in the past 24 hours.  He complains of right hip pain.  Laboratory  including CBC, CMP.  Results are significant for leukocytosis.  Urinalysis not yet resulted.  CT head and C-spine obtained and demonstrate no acute findings consistent with trauma.  X-rays of the right femur, right knee, pelvis and chest were obtained and personally reviewed by me.  Results are significant slight cortical irregularity in the right pubic bone.  CT hip obtained.  Given patient's inability to ambulate after falling, patient will likely require admission to hospitalist for PT/OT.  Patient care transitioned to Dr. Roxanne Mins at 2411.  Final Clinical Impressions(s) / ED Diagnoses   Final diagnoses:  Fall, initial encounter  Leg pain, right    ED Discharge Orders    None       Elveria Rising, MD 03/18/17 0034    Tegeler, Gwenyth Allegra, MD 03/18/17 1734

## 2017-03-17 NOTE — ED Notes (Signed)
Patient transported to CT 

## 2017-03-18 ENCOUNTER — Observation Stay (HOSPITAL_COMMUNITY): Payer: Medicare Other

## 2017-03-18 ENCOUNTER — Emergency Department (HOSPITAL_COMMUNITY): Payer: Medicare Other

## 2017-03-18 ENCOUNTER — Encounter (HOSPITAL_COMMUNITY): Payer: Self-pay | Admitting: Internal Medicine

## 2017-03-18 DIAGNOSIS — S72144A Nondisplaced intertrochanteric fracture of right femur, initial encounter for closed fracture: Secondary | ICD-10-CM | POA: Diagnosis not present

## 2017-03-18 DIAGNOSIS — S32591A Other specified fracture of right pubis, initial encounter for closed fracture: Secondary | ICD-10-CM | POA: Diagnosis not present

## 2017-03-18 DIAGNOSIS — W19XXXA Unspecified fall, initial encounter: Secondary | ICD-10-CM | POA: Diagnosis not present

## 2017-03-18 DIAGNOSIS — S329XXA Fracture of unspecified parts of lumbosacral spine and pelvis, initial encounter for closed fracture: Secondary | ICD-10-CM | POA: Diagnosis not present

## 2017-03-18 DIAGNOSIS — M25551 Pain in right hip: Secondary | ICD-10-CM | POA: Diagnosis not present

## 2017-03-18 DIAGNOSIS — S79911A Unspecified injury of right hip, initial encounter: Secondary | ICD-10-CM | POA: Diagnosis not present

## 2017-03-18 DIAGNOSIS — S32511A Fracture of superior rim of right pubis, initial encounter for closed fracture: Secondary | ICD-10-CM | POA: Diagnosis not present

## 2017-03-18 LAB — BASIC METABOLIC PANEL
ANION GAP: 11 (ref 5–15)
BUN: 36 mg/dL — ABNORMAL HIGH (ref 6–20)
CO2: 26 mmol/L (ref 22–32)
Calcium: 8.6 mg/dL — ABNORMAL LOW (ref 8.9–10.3)
Chloride: 100 mmol/L — ABNORMAL LOW (ref 101–111)
Creatinine, Ser: 2.18 mg/dL — ABNORMAL HIGH (ref 0.61–1.24)
GFR, EST AFRICAN AMERICAN: 29 mL/min — AB (ref >60)
GFR, EST NON AFRICAN AMERICAN: 25 mL/min — AB (ref >60)
GLUCOSE: 137 mg/dL — AB (ref 65–99)
POTASSIUM: 4.4 mmol/L (ref 3.5–5.1)
SODIUM: 137 mmol/L (ref 135–145)

## 2017-03-18 LAB — CBC
HEMATOCRIT: 36.2 % — AB (ref 39.0–52.0)
HEMOGLOBIN: 12.1 g/dL — AB (ref 13.0–17.0)
MCH: 28.5 pg (ref 26.0–34.0)
MCHC: 33.4 g/dL (ref 30.0–36.0)
MCV: 85.2 fL (ref 78.0–100.0)
Platelets: 178 10*3/uL (ref 150–400)
RBC: 4.25 MIL/uL (ref 4.22–5.81)
RDW: 16.1 % — ABNORMAL HIGH (ref 11.5–15.5)
WBC: 10 10*3/uL (ref 4.0–10.5)

## 2017-03-18 LAB — URINALYSIS, ROUTINE W REFLEX MICROSCOPIC
Bilirubin Urine: NEGATIVE
Glucose, UA: NEGATIVE mg/dL
HGB URINE DIPSTICK: NEGATIVE
Ketones, ur: NEGATIVE mg/dL
Leukocytes, UA: NEGATIVE
Nitrite: NEGATIVE
Protein, ur: NEGATIVE mg/dL
SPECIFIC GRAVITY, URINE: 1.013 (ref 1.005–1.030)
pH: 5 (ref 5.0–8.0)

## 2017-03-18 LAB — TROPONIN I
TROPONIN I: 0.04 ng/mL — AB (ref <0.03)
Troponin I: 0.04 ng/mL (ref <0.03)

## 2017-03-18 MED ORDER — TAMSULOSIN HCL 0.4 MG PO CAPS
0.4000 mg | ORAL_CAPSULE | Freq: Every day | ORAL | Status: DC
  Administered 2017-03-19: 0.4 mg via ORAL
  Filled 2017-03-18: qty 1

## 2017-03-18 MED ORDER — LEVOTHYROXINE SODIUM 50 MCG PO TABS
50.0000 ug | ORAL_TABLET | Freq: Every day | ORAL | Status: DC
  Administered 2017-03-18 – 2017-03-19 (×2): 50 ug via ORAL
  Filled 2017-03-18 (×2): qty 1

## 2017-03-18 MED ORDER — FUROSEMIDE 40 MG PO TABS
40.0000 mg | ORAL_TABLET | Freq: Every day | ORAL | Status: DC
  Administered 2017-03-18 – 2017-03-19 (×2): 40 mg via ORAL
  Filled 2017-03-18 (×2): qty 1

## 2017-03-18 MED ORDER — POTASSIUM CHLORIDE ER 10 MEQ PO TBCR
10.0000 meq | EXTENDED_RELEASE_TABLET | Freq: Every day | ORAL | Status: DC
  Administered 2017-03-18 – 2017-03-19 (×2): 10 meq via ORAL
  Filled 2017-03-18 (×4): qty 1

## 2017-03-18 MED ORDER — POLYSACCHARIDE IRON COMPLEX 150 MG PO CAPS
150.0000 mg | ORAL_CAPSULE | Freq: Two times a day (BID) | ORAL | Status: DC
  Administered 2017-03-18 – 2017-03-19 (×3): 150 mg via ORAL
  Filled 2017-03-18 (×3): qty 1

## 2017-03-18 MED ORDER — MORPHINE SULFATE (PF) 4 MG/ML IV SOLN: 0.5000 mg | INTRAVENOUS | Status: DC | PRN

## 2017-03-18 MED ORDER — DUTASTERIDE-TAMSULOSIN HCL 0.5-0.4 MG PO CAPS: 1.0000 | ORAL_CAPSULE | Freq: Every day | ORAL | Status: DC

## 2017-03-18 MED ORDER — ASPIRIN EC 81 MG PO TBEC
81.0000 mg | DELAYED_RELEASE_TABLET | Freq: Every day | ORAL | Status: DC
  Administered 2017-03-18 – 2017-03-19 (×2): 81 mg via ORAL
  Filled 2017-03-18 (×2): qty 1

## 2017-03-18 MED ORDER — DUTASTERIDE 0.5 MG PO CAPS
0.5000 mg | ORAL_CAPSULE | Freq: Every day | ORAL | Status: DC
  Administered 2017-03-18 – 2017-03-19 (×2): 0.5 mg via ORAL
  Filled 2017-03-18 (×2): qty 1

## 2017-03-18 MED ORDER — ADULT MULTIVITAMIN W/MINERALS CH
ORAL_TABLET | Freq: Every day | ORAL | Status: DC
  Administered 2017-03-18 – 2017-03-19 (×2): 1 via ORAL
  Filled 2017-03-18 (×2): qty 1

## 2017-03-18 MED ORDER — ACETAMINOPHEN 325 MG PO TABS
650.0000 mg | ORAL_TABLET | Freq: Four times a day (QID) | ORAL | Status: DC | PRN
  Administered 2017-03-18 – 2017-03-19 (×2): 650 mg via ORAL
  Filled 2017-03-18 (×2): qty 2

## 2017-03-18 MED ORDER — ONDANSETRON HCL 4 MG/2ML IJ SOLN
4.0000 mg | Freq: Four times a day (QID) | INTRAMUSCULAR | Status: DC | PRN
Start: 2017-03-18 — End: 2017-03-19

## 2017-03-18 MED ORDER — ENSURE ENLIVE PO LIQD
237.0000 mL | Freq: Every day | ORAL | Status: DC
  Administered 2017-03-18 – 2017-03-19 (×2): 237 mL via ORAL
  Filled 2017-03-18 (×2): qty 237

## 2017-03-18 MED ORDER — PANTOPRAZOLE SODIUM 40 MG PO TBEC
40.0000 mg | DELAYED_RELEASE_TABLET | Freq: Every day | ORAL | Status: DC
  Administered 2017-03-18 – 2017-03-19 (×2): 40 mg via ORAL
  Filled 2017-03-18 (×2): qty 1

## 2017-03-18 MED ORDER — VITAMIN D 1000 UNITS PO TABS
1000.0000 [IU] | ORAL_TABLET | Freq: Every day | ORAL | Status: DC
  Administered 2017-03-18 – 2017-03-19 (×2): 1000 [IU] via ORAL
  Filled 2017-03-18 (×2): qty 1

## 2017-03-18 MED ORDER — ONDANSETRON HCL 4 MG PO TABS: 4.0000 mg | ORAL_TABLET | Freq: Four times a day (QID) | ORAL | Status: DC | PRN

## 2017-03-18 MED ORDER — ACETAMINOPHEN 650 MG RE SUPP: 650.0000 mg | Freq: Four times a day (QID) | RECTAL | Status: DC | PRN

## 2017-03-18 MED ORDER — DOCUSATE SODIUM 100 MG PO CAPS
100.0000 mg | ORAL_CAPSULE | Freq: Two times a day (BID) | ORAL | Status: DC
  Administered 2017-03-18 – 2017-03-19 (×3): 100 mg via ORAL
  Filled 2017-03-18 (×3): qty 1

## 2017-03-18 NOTE — Consult Note (Signed)
Reason for Consult:Hip and pelvic fxs Referring Physician: Ondra Chambers is an 84 y.o. male.  HPI: Justin Chambers got out of bed to go to the bathroom and fell. It was unobserved. This happened at the memory care unit in which he resides. He has been c/o severe right hip pain and various studies have confirmed a right intertroch fx and multiple pelvic fxs. His dementia is bad enough that he cannot contribute to history, most of which was gleaned from the chart.  Past Medical History:  Diagnosis Date  . Alzheimer disease   . Anemia due to chronic blood loss 07/2015  . Atrial fibrillation ([place])   . Cholecystitis   . Chronic kidney disease   . CKD (chronic kidney disease)   . Dementia   . DOE (dyspnea on exertion)   . Fracture    right leg(casted- high school), collar bone(child)  . Hypothyroidism   . Transfusion history    in past ? when    Past Surgical History:  Procedure Laterality Date  . ABDOMINAL SURGERY     part of stomach removed  . CHOLECYSTECTOMY N/A 04/27/2013   Procedure: LAPAROSCOPIC CHOLECYSTECTOMY WITH INTRAOPERATIVE CHOLANGIOGRAM;  Surgeon: Imogene Burn. [name], MD;  Location: [facility];  Service: General;  Laterality: N/A;  . ESOPHAGOGASTRODUODENOSCOPY N/A 07/30/2015   Procedure: ESOPHAGOGASTRODUODENOSCOPY (EGD);  Surgeon: Ronald Lobo, MD;  Location: [facility] ENDOSCOPY;  Service: Endoscopy;  Laterality: N/A;  Pediatric UPPER endoscope, please  . LAPAROSCOPIC LYSIS OF ADHESIONS N/A 04/27/2013   Procedure: LAPAROSCOPIC LYSIS OF ADHESIONS;  Surgeon: Imogene Burn. [name], MD;  Location: [facility];  Service: General;  Laterality: N/A;  . PARTIAL GASTRECTOMY Bilateral   . TONSILLECTOMY      Family History  Problem Relation Age of Onset  . Diabetes Neg Hx   . CAD Neg Hx     Social History:  reports that  has never smoked. he has never used smokeless tobacco. He reports that he does not drink alcohol or use drugs.  Allergies:  Allergies  Allergen Reactions  . Latex   . Savaysa  [Edoxaban] Other (See Comments)    Caused Hgb to drop- adverse reaction, not allergy    Medications: I have reviewed the patient's current medications.  Results for orders placed or performed during the hospital encounter of 03/17/17 (from the past 48 hour(s))  CBC with Differential     Status: Abnormal   Collection Time: 03/17/17 10:57 PM  Result Value Ref Range   WBC 10.7 (H) 4.0 - 10.5 K/uL   RBC 4.49 4.22 - 5.81 MIL/uL   Hemoglobin 12.7 (L) 13.0 - 17.0 g/dL   HCT 38.4 (L) 39.0 - 52.0 %   MCV 85.5 78.0 - 100.0 fL   MCH 28.3 26.0 - 34.0 pg   MCHC 33.1 30.0 - 36.0 g/dL   RDW 16.0 (H) 11.5 - 15.5 %   Platelets 195 150 - 400 K/uL   Neutrophils Relative % 84 %   Lymphocytes Relative 9 %   Monocytes Relative 7 %   Eosinophils Relative 0 %   Basophils Relative 0 %   Neutro Abs 8.9 (H) 1.7 - 7.7 K/uL   Lymphs Abs 1.0 0.7 - 4.0 K/uL   Monocytes Absolute 0.8 0.1 - 1.0 K/uL   Eosinophils Absolute 0.0 0.0 - 0.7 K/uL   Basophils Absolute 0.0 0.0 - 0.1 K/uL   WBC Morphology ATYPICAL LYMPHOCYTES   Comprehensive metabolic panel     Status: Abnormal   Collection Time: 03/17/17  10:57 PM  Result Value Ref Range   Sodium 137 135 - 145 mmol/L   Potassium 4.5 3.5 - 5.1 mmol/L   Chloride 100 (L) 101 - 111 mmol/L   CO2 26 22 - 32 mmol/L   Glucose, Bld 153 (H) 65 - 99 mg/dL   BUN 36 (H) 6 - 20 mg/dL   Creatinine, Ser 2.21 (H) 0.61 - 1.24 mg/dL   Calcium 8.8 (L) 8.9 - 10.3 mg/dL   Total Protein 5.6 (L) 6.5 - 8.1 g/dL   Albumin 3.0 (L) 3.5 - 5.0 g/dL   AST 29 15 - 41 U/L   ALT 20 17 - 63 U/L   Alkaline Phosphatase 68 38 - 126 U/L   Total Bilirubin 1.1 0.3 - 1.2 mg/dL   GFR calc non Af Amer 24 (L) >60 mL/min   GFR calc Af Amer 28 (L) >60 mL/min    Comment: (NOTE) The eGFR has been calculated using the CKD EPI equation. This calculation has not been validated in all clinical situations. eGFR's persistently <60 mL/min signify possible Chronic Kidney Disease.    Anion gap 11 5 - 15  Basic  metabolic panel     Status: Abnormal   Collection Time: 03/18/17  3:23 AM  Result Value Ref Range   Sodium 137 135 - 145 mmol/L   Potassium 4.4 3.5 - 5.1 mmol/L   Chloride 100 (L) 101 - 111 mmol/L   CO2 26 22 - 32 mmol/L   Glucose, Bld 137 (H) 65 - 99 mg/dL   BUN 36 (H) 6 - 20 mg/dL   Creatinine, Ser 2.18 (H) 0.61 - 1.24 mg/dL   Calcium 8.6 (L) 8.9 - 10.3 mg/dL   GFR calc non Af Amer 25 (L) >60 mL/min   GFR calc Af Amer 29 (L) >60 mL/min    Comment: (NOTE) The eGFR has been calculated using the CKD EPI equation. This calculation has not been validated in all clinical situations. eGFR's persistently <60 mL/min signify possible Chronic Kidney Disease.    Anion gap 11 5 - 15  CBC     Status: Abnormal   Collection Time: 03/18/17  3:23 AM  Result Value Ref Range   WBC 10.0 4.0 - 10.5 K/uL   RBC 4.25 4.22 - 5.81 MIL/uL   Hemoglobin 12.1 (L) 13.0 - 17.0 g/dL   HCT 36.2 (L) 39.0 - 52.0 %   MCV 85.2 78.0 - 100.0 fL   MCH 28.5 26.0 - 34.0 pg   MCHC 33.4 30.0 - 36.0 g/dL   RDW 16.1 (H) 11.5 - 15.5 %   Platelets 178 150 - 400 K/uL  Troponin I     Status: Abnormal   Collection Time: 03/18/17  9:47 AM  Result Value Ref Range   Troponin I 0.04 (HH) <0.03 ng/mL    Comment: CRITICAL RESULT CALLED TO, READ BACK BY AND VERIFIED WITH: DANA COX,RN AT 1118 03/18/17 BY ZBEECH.     Dg Chest 1 View  Result Date: 03/18/2017 CLINICAL DATA:  84 year old male with fall. EXAM: CHEST 1 VIEW COMPARISON:  Chest radiograph dated 12/13/2016 FINDINGS: Right lung base atelectatic changes. A small right pleural effusion may be present. Overall the aeration of the lungs is significantly improved compared to prior radiograph. The left lung is clear. There is no pneumothorax. There is mild cardiomegaly. Atherosclerotic calcification of the aortic arch and thoracic aorta. Osteopenia. No acute osseous pathology. IMPRESSION: Right lung base atelectatic changes and possible small right pleural effusion. Overall  significant improvement  in aeration of the lungs compared to the prior radiograph. Electronically Signed   By: Anner Crete M.D.   On: 03/18/2017 00:06   Dg Pelvis 1-2 Views  Result Date: 03/18/2017 CLINICAL DATA:  Fall.  Right hip pain. EXAM: PELVIS - 1-2 VIEW COMPARISON:  09/11/2015 CT abdomen/pelvis. FINDINGS: There is a suggestion of slight cortical irregularity in right pubic bone, cannot exclude a nondisplaced fracture in this location. No additional potential pelvic fracture. No pelvic diastasis. No evidence of hip dislocation on this single frontal view. No suspicious focal osseous lesions. Degenerative changes in the visualized lower lumbar spine. Bilateral hip osteoarthritis. IMPRESSION: Suggestion of slight cortical irregularity in the right pubic bone, cannot exclude a nondisplaced fracture in this location. No additional potential pelvic fracture. No pelvic diastasis. No evidence of hip dislocation on this single frontal view. Electronically Signed   By: Ilona Sorrel M.D.   On: 03/18/2017 00:09   Dg Knee 2 Views Right  Result Date: 03/18/2017 CLINICAL DATA:  Fall.  Right knee pain. EXAM: RIGHT KNEE - 1-2 VIEW COMPARISON:  None. FINDINGS: No right knee fracture, joint effusion or dislocation. Meniscal chondrocalcinosis. Mild medial and lateral compartment osteoarthritis in the right knee joint. No suspicious focal osseous lesions. Vascular calcifications throughout the posterior soft tissues. No radiopaque foreign body. IMPRESSION: 1. No right knee fracture, joint effusion or dislocation. 2. Mild medial and lateral compartment right knee osteoarthritis, probably due to CPPD arthropathy given the meniscal chondrocalcinosis. Electronically Signed   By: Ilona Sorrel M.D.   On: 03/18/2017 00:05   Ct Head Wo Contrast  Result Date: 03/18/2017 CLINICAL DATA:  Unwitnessed fall.  Alzheimer disease. EXAM: CT HEAD WITHOUT CONTRAST CT CERVICAL SPINE WITHOUT CONTRAST TECHNIQUE: Multidetector CT  imaging of the head and cervical spine was performed following the standard protocol without intravenous contrast. Multiplanar CT image reconstructions of the cervical spine were also generated. COMPARISON:  05/09/2008 head CT. FINDINGS: CT HEAD FINDINGS Brain: No evidence of parenchymal hemorrhage or extra-axial fluid collection. No mass lesion, mass effect, or midline shift. No CT evidence of acute infarction. Generalized cerebral volume loss. Focal encephalomalacia in the right parietal lobe and left cerebellar hemisphere. Nonspecific mild to moderate subcortical and periventricular white matter hypodensity, most in keeping with chronic small vessel ischemic change. Cerebral ventricle sizes are concordant with the degree of cerebral volume loss. Vascular: No acute abnormality. Skull: No evidence of calvarial fracture. Sinuses/Orbits: The visualized paranasal sinuses are essentially clear. Other:  The mastoid air cells are unopacified. CT CERVICAL SPINE FINDINGS Alignment: Normal cervical lordosis. No facet subluxation. Dens is well positioned between the lateral masses of C1. There is 2 mm retrolisthesis at C5-6. There is 3 mm anterolisthesis at C7-T1. Skull base and vertebrae: No acute fracture. No primary bone lesion or focal pathologic process. Soft tissues and spinal canal: No prevertebral edema. No visible canal hematoma. Disc levels: Severe multilevel degenerative disc disease throughout the cervical spine. Severe bilateral facet arthropathy. Moderate degenerative foraminal stenosis on the left at C3-4. Mild degenerative foraminal stenosis bilaterally at C4-5. Moderate degenerative foraminal stenosis on the right at C5-6. Upper chest: No acute abnormality. Other: Visualized mastoid air cells appear clear. No discrete thyroid nodules. No pathologically enlarged cervical nodes. IMPRESSION: CT HEAD: 1. No evidence of acute intracranial abnormality. No evidence of calvarial fracture. 2. Generalized cerebral  volume loss and chronic small vessel ischemic changes in the cerebral white matter. 3. Encephalomalacia in the right parietal lobe and left cerebellar hemisphere. CT CERVICAL SPINE: 1.  No cervical spine fracture or facet subluxation. 2. Severe multilevel degenerative changes throughout the cervical spine as detailed. 3. Mild multilevel cervical spine spondylolisthesis is probably degenerative. Electronically Signed   By: Ilona Sorrel M.D.   On: 03/18/2017 00:02   Ct Cervical Spine Wo Contrast  Result Date: 03/18/2017 CLINICAL DATA:  Unwitnessed fall.  Alzheimer disease. EXAM: CT HEAD WITHOUT CONTRAST CT CERVICAL SPINE WITHOUT CONTRAST TECHNIQUE: Multidetector CT imaging of the head and cervical spine was performed following the standard protocol without intravenous contrast. Multiplanar CT image reconstructions of the cervical spine were also generated. COMPARISON:  05/09/2008 head CT. FINDINGS: CT HEAD FINDINGS Brain: No evidence of parenchymal hemorrhage or extra-axial fluid collection. No mass lesion, mass effect, or midline shift. No CT evidence of acute infarction. Generalized cerebral volume loss. Focal encephalomalacia in the right parietal lobe and left cerebellar hemisphere. Nonspecific mild to moderate subcortical and periventricular white matter hypodensity, most in keeping with chronic small vessel ischemic change. Cerebral ventricle sizes are concordant with the degree of cerebral volume loss. Vascular: No acute abnormality. Skull: No evidence of calvarial fracture. Sinuses/Orbits: The visualized paranasal sinuses are essentially clear. Other:  The mastoid air cells are unopacified. CT CERVICAL SPINE FINDINGS Alignment: Normal cervical lordosis. No facet subluxation. Dens is well positioned between the lateral masses of C1. There is 2 mm retrolisthesis at C5-6. There is 3 mm anterolisthesis at C7-T1. Skull base and vertebrae: No acute fracture. No primary bone lesion or focal pathologic process.  Soft tissues and spinal canal: No prevertebral edema. No visible canal hematoma. Disc levels: Severe multilevel degenerative disc disease throughout the cervical spine. Severe bilateral facet arthropathy. Moderate degenerative foraminal stenosis on the left at C3-4. Mild degenerative foraminal stenosis bilaterally at C4-5. Moderate degenerative foraminal stenosis on the right at C5-6. Upper chest: No acute abnormality. Other: Visualized mastoid air cells appear clear. No discrete thyroid nodules. No pathologically enlarged cervical nodes. IMPRESSION: CT HEAD: 1. No evidence of acute intracranial abnormality. No evidence of calvarial fracture. 2. Generalized cerebral volume loss and chronic small vessel ischemic changes in the cerebral white matter. 3. Encephalomalacia in the right parietal lobe and left cerebellar hemisphere. CT CERVICAL SPINE: 1. No cervical spine fracture or facet subluxation. 2. Severe multilevel degenerative changes throughout the cervical spine as detailed. 3. Mild multilevel cervical spine spondylolisthesis is probably degenerative. Electronically Signed   By: Ilona Sorrel M.D.   On: 03/18/2017 00:02   Ct Hip Right Wo Contrast  Result Date: 03/18/2017 CLINICAL DATA:  84 year old male with fall and right lower extremity pain. EXAM: CT OF THE RIGHT HIP WITHOUT CONTRAST TECHNIQUE: Multidetector CT imaging of the right hip was performed according to the standard protocol. Multiplanar CT image reconstructions were also generated. COMPARISON:  Radiograph dated 03/17/2017 and CT of the abdomen pelvis dated 09/11/2015 FINDINGS: Bones/Joint/Cartilage Evaluation is limited due to advanced osteopenia. There is no displaced fracture. Focal cortical discontinuity of the greater trochanter of the femur (coronal series 8, image 61 and sagittal series 9, image 19) may be chronic. A nondisplaced cortical fracture is not entirely excluded. There is also discontinuity of the inferior right sacral alum  (coronal series 8 image 60 and 61) which may represent chronic changes and osteopenia although a fracture is not entirely excluded. There irregularity of the right superior and inferior pubic rami suspicious for acute nondisplaced fractures of the right superior and inferior pubic rami (series 9, image 73). There is moderate osteoarthritic changes of the right hip. No joint effusion. Ligaments  Suboptimally assessed by CT. Muscles and Tendons No intramuscular hematoma. Soft tissues Trabecular appearance of the bladder wall likely related to chronic bladder outlet obstruction. Partially visualized enlarged prostate gland. There is advanced atherosclerotic calcification of the visualized vasculature. IMPRESSION: 1. Limited exam due to advanced osteopenia. No displaced femoral fracture. No dislocation. Linear lucency in the cortex of the greater trochanter of the femur may be chronic and less likely nondisplaced cortical fracture. 2. Findings suspicious for nondisplaced fractures of the right superior and inferior pubic rami. 3. Vertical lucency in the lateral right sacral alum with discontinuity of the inferior cortex of the right sacral alum may be chronic or related osteopenia. A nondisplaced fracture is not entirely excluded. Clinical correlation is recommended. Electronically Signed   By: Anner Crete M.D.   On: 03/18/2017 01:25   Mr Hip Right Wo Contrast  Result Date: 03/18/2017 CLINICAL DATA:  Unwitnessed fall, right hip pain. EXAM: MR OF THE RIGHT HIP WITHOUT CONTRAST TECHNIQUE: Multiplanar, multisequence MR imaging was performed. No intravenous contrast was administered. COMPARISON:  CT from 03/18/2017 FINDINGS: Bones: Nondisplaced fracture the right superior pubic ramus extending into the pubic body and adjacent junction of the pubic body and right inferior pubic ramus. Low-level abnormal edema extending vertically in the right sacral ala noted on the coronal images, favoring fracture. Today's exam was  focused on the right hip rather than the sacrum. Right-sided intertrochanteric fracture is present and nondisplaced. There is a transverse component in the greater trochanter, with the portion extending towards the lesser trochanter much less conspicuous but still thought to be present as on images 3 through 8 of series 5 and for example on image 17 of series 8. Articular cartilage and labrum Articular cartilage:  Severe degenerative chondral thinning Labrum: Abnormal linear increased signal along the deep margin of the right anterior superior labrum on images 16-19 of series 9 compatible with labral tear, likely chronic. Joint or bursal effusion Joint effusion:  Trace bilateral hip joint effusions. Bursae: No distinct bursitis, although fluid signal does track deep to the right iliotibial band. Muscles and tendons Muscles and tendons: Considerable edema around the right hip including the gluteus minimus and medius muscles, the right hip adductor musculature, and vastus lateralis muscle. Adjacent subcutaneous edema is also present. Other findings Miscellaneous: Indirect left inguinal hernia contains a loop of bowel. Enlarged prostate gland measures 5.4 by 4.9 by 5.4 cm (volume = 75 cm^3). Lower lumbar spondylosis and degenerative disc disease. IMPRESSION: 1. Nondisplaced right intertrochanteric fracture. 2. Nondisplaced fractures through the right superior pubic ramus, pubic body, and adjacent inferior pubic ramus. 3. Nondisplaced fracture of the right sacral ala. 4. Extensive muscular edema in the right gluteal musculature, right hip adductor musculature, and right vastus lateralis with fluid signal tracking along the iliotibial band on the right. 5. Severe degenerative right hip arthropathy. 6. Chronic appearing right labral tear. 7. Trace bilateral hip joint effusions. 8. Indirect left inguinal hernia contains a loop of small bowel without findings of strangulation/obstruction. 9. Enlarged prostate gland. 10.  Lower lumbar spondylosis and degenerative disc disease. Electronically Signed   By: Van Clines M.D.   On: 03/18/2017 08:18   Dg Femur Min 2 Views Right  Result Date: 03/18/2017 CLINICAL DATA:  Fall.  Right hip pain. EXAM: RIGHT FEMUR 2 VIEWS COMPARISON:  None. FINDINGS: No fracture. No suspicious focal osseous lesion. No dislocation at the right hip or right knee. Moderate osteoarthritis in the right hip joint. Moderate medial and lateral compartment right knee osteoarthritis.  Extensive vascular calcifications throughout the soft tissues. No radiopaque foreign body. IMPRESSION: No fracture in the right femur. Electronically Signed   By: Ilona Sorrel M.D.   On: 03/18/2017 00:04    Review of Systems  Unable to perform ROS: Dementia   Blood pressure 93/81, pulse (!) 108, temperature 98 F (36.7 C), temperature source Oral, resp. rate 18, height _0  (1.753 m), weight 61.2 kg (135 lb), SpO2 96 %. Physical Exam  Constitutional: He appears well-developed and well-nourished. No distress.  HENT:  Head: Normocephalic.  Eyes: Conjunctivae are normal. Right eye exhibits no discharge. Left eye exhibits no discharge. No scleral icterus.  Neck: Normal range of motion.  Cardiovascular: Normal rate.  Respiratory: Effort normal. No respiratory distress.  Musculoskeletal:  RLE No traumatic wounds, ecchymosis, or rash  Severe TTP trochanter, mild TTP ASIS  No knee or ankle effusion  Knee stable to varus/ valgus and anterior/posterior stress  Sens DPN, SPN, TN intact  Motor EHL, ext, flex, evers 5/5  DP 2+, PT 1+, No significant edema  LLE No traumatic wounds, ecchymosis, or rash  Nontender  No knee or ankle effusion  Knee stable to varus/ valgus and anterior/posterior stress  Sens DPN, SPN, TN intact  Motor EHL, ext, flex, evers 5/5  DP 2+, PT 2+, No significant edema  Neurological: He is alert.  Skin: Skin is warm and dry. He is not diaphoretic.  Psychiatric: He has a normal mood and  affect. His behavior is normal.    Assessment/Plan: Multiple pelvic fxs -- Normally these would be given a trial of WBAT Right intertrochanteric fx -- There are few good options here. ORIF carries a high intraoperative risk but would keep pt (admittedly barely) ambulatory. Pharmacologic treatment of his pain carries the resultant narcotic side effects. Skeletal traction would help the pain but would keep him bed bound and expose him to pin site complications. Will ask palliative care to see to help family with decision making (they were apparently involved at his dx of AS) and will await their input before any final decision. I spoke with daughter Nelly Laurence who understood and was very receptive to palliative input.    Lisette Abu, PA-C Orthopedic Surgery [phone] 03/18/2017, 1:34 PM

## 2017-03-18 NOTE — H&P (Signed)
History and Physical    Justin Chambers OFB:[id number] DOB: [birth year] DOA: 03/17/2017  PCP: Burnard Bunting, MD  Patient coming from: [facility].  Chief Complaint: Fall.  HPI: Justin Chambers is a 84 y.o. male with history of dementia, critical aortic stenosis not a candidate for intervention, cardiomyopathy, chronic kidney disease, chronic anemia, hypothyroidism, atrial fibrillation was brought to the ER from the nursing home after patient had an unwitnessed fall.  Patient had a fall and was found on the floor when patient was trying to go to the bathroom 10 minutes after he went to the bed.  Patient was complaining of right hip pain and was brought to the ER.  ED Course: In the ER CT head C-spine were unremarkable chest x-ray shows improvement from previous x-rays.  Patient had an x-ray of the right hip and pelvis which was showing some cortical abnormalities and had CT of the pelvis done which shows possible right superior and inferior pubic rami fracture with possible fracture of the right sacrum.  There also was some abnormality in the right greater trochanter with possibility of fracture.  Patient has been unable to bear weight and has been admitted for further observation.  MRI of the right hip is pending.  Review of Systems: As per HPI, rest all negative.   Past Medical History:  Diagnosis Date  . Alzheimer disease   . Anemia due to chronic blood loss 07/2015  . Atrial fibrillation ([place])   . Cholecystitis   . Chronic kidney disease   . CKD (chronic kidney disease)   . Dementia   . DOE (dyspnea on exertion)   . Fracture    right leg(casted- high school), collar bone(child)  . Hypothyroidism   . Transfusion history    in past ? when    Past Surgical History:  Procedure Laterality Date  . ABDOMINAL SURGERY     part of stomach removed  . CHOLECYSTECTOMY N/A 04/27/2013   Procedure: LAPAROSCOPIC CHOLECYSTECTOMY WITH INTRAOPERATIVE CHOLANGIOGRAM;  Surgeon: Imogene Burn. [name], MD;  Location: [facility];  Service: General;  Laterality: N/A;  . ESOPHAGOGASTRODUODENOSCOPY N/A 07/30/2015   Procedure: ESOPHAGOGASTRODUODENOSCOPY (EGD);  Surgeon: Ronald Lobo, MD;  Location: [facility] ENDOSCOPY;  Service: Endoscopy;  Laterality: N/A;  Pediatric UPPER endoscope, please  . LAPAROSCOPIC LYSIS OF ADHESIONS N/A 04/27/2013   Procedure: LAPAROSCOPIC LYSIS OF ADHESIONS;  Surgeon: Imogene Burn. [name], MD;  Location: [facility];  Service: General;  Laterality: N/A;  . PARTIAL GASTRECTOMY Bilateral   . TONSILLECTOMY       reports that  has never smoked. he has never used smokeless tobacco. He reports that he does not drink alcohol or use drugs.  Allergies  Allergen Reactions  . Latex   . Savaysa [Edoxaban] Other (See Comments)    Caused Hgb to drop- adverse reaction, not allergy    Family History  Problem Relation Age of Onset  . Diabetes Neg Hx   . CAD Neg Hx     Prior to Admission medications   Medication Sig Start Date End Date Taking? Authorizing Provider  aspirin EC 81 MG tablet Take 81 mg by mouth daily.   Yes [provider]  Cholecalciferol (VITAMIN D-3) 1000 UNITS CAPS Take 1,000 Units by mouth daily.    Yes [provider]  docusate sodium (COLACE) 100 MG capsule Take 100 mg by mouth 2 (two) times daily.   Yes [provider]  Dutasteride-Tamsulosin HCl 0.5-0.4 MG CAPS Take 1 capsule by mouth daily.  Yes [provider]  ENSURE (ENSURE) Take 237 mLs by mouth daily.   Yes [provider]  furosemide (LASIX) 40 MG tablet Take 1 tablet (40 mg total) by mouth daily. 12/16/16 12/16/17 Yes Danford, Suann Larry, MD  iron polysaccharides (NIFEREX) 150 MG capsule Take 150 mg by mouth 2 (two) times daily.   Yes [provider]  levothyroxine (SYNTHROID, LEVOTHROID) 50 MCG tablet Take 50 mcg by mouth daily before breakfast.  01/28/14  Yes [provider]  Multiple Vitamins-Minerals (MULTIVITAMIN ADULTS 50+) TABS Take 1  tablet by mouth daily.   Yes [provider]  pantoprazole (PROTONIX) 40 MG tablet Take 1 tablet (40 mg total) by mouth daily. 07/31/15  Yes Rai, Ripudeep K, MD  potassium chloride (K-DUR) 10 MEQ tablet Take 1 tablet (10 mEq total) by mouth daily. 12/16/16  Yes Edwin Dada, MD    Physical Exam: Vitals:   03/17/17 2300 03/17/17 2330 03/18/17 0015 03/18/17 0130  BP: 100/78 107/76 97/84 106/79  Pulse:   (!) 120 (!) 101  Resp: 14 (!) 22 18 (!) 23  Temp:      TempSrc:      SpO2:  93% 93% 95%  Weight:      Height:          Constitutional: Moderately built and nourished. Vitals:   03/17/17 2300 03/17/17 2330 03/18/17 0015 03/18/17 0130  BP: 100/78 107/76 97/84 106/79  Pulse:   (!) 120 (!) 101  Resp: 14 (!) 22 18 (!) 23  Temp:      TempSrc:      SpO2:  93% 93% 95%  Weight:      Height:       Eyes: Anicteric no pallor. ENMT: No discharge from the ears eyes nose or mouth. Neck: No mass felt.  No neck rigidity no JVD appreciated. Respiratory: No rhonchi or crepitations. Cardiovascular: R4-W5 heard systolic murmur. Abdomen: Soft nontender bowel sounds present. Musculoskeletal: No edema.  No joint effusion. Skin: No rash.  Skin appears warm. Neurologic: Alert awake oriented to his name.  Follows commands moves all extremities. Psychiatric: Has dementia.   Labs on Admission: I have personally reviewed following labs and imaging studies  CBC: Recent Labs  Lab 03/17/17 2257  WBC 10.7*  NEUTROABS 8.9*  HGB 12.7*  HCT 38.4*  MCV 85.5  PLT 462   Basic Metabolic Panel: Recent Labs  Lab 03/17/17 2257  NA 137  K 4.5  CL 100*  CO2 26  GLUCOSE 153*  BUN 36*  CREATININE 2.21*  CALCIUM 8.8*   GFR: Estimated Creatinine Clearance: 18.8 mL/min (A) (by C-G formula based on SCr of 2.21 mg/dL (H)). Liver Function Tests: Recent Labs  Lab 03/17/17 2257  AST 29  ALT 20  ALKPHOS 68  BILITOT 1.1  PROT 5.6*  ALBUMIN 3.0*   No results for input(s):  LIPASE, AMYLASE in the last 168 hours. No results for input(s): AMMONIA in the last 168 hours. Coagulation Profile: No results for input(s): INR, PROTIME in the last 168 hours. Cardiac Enzymes: No results for input(s): CKTOTAL, CKMB, CKMBINDEX, TROPONINI in the last 168 hours. BNP (last 3 results) No results for input(s): PROBNP in the last 8760 hours. HbA1C: No results for input(s): HGBA1C in the last 72 hours. CBG: No results for input(s): GLUCAP in the last 168 hours. Lipid Profile: No results for input(s): CHOL, HDL, LDLCALC, TRIG, CHOLHDL, LDLDIRECT in the last 72 hours. Thyroid Function Tests: No results for input(s): TSH, T4TOTAL, FREET4,  T3FREE, THYROIDAB in the last 72 hours. Anemia Panel: No results for input(s): VITAMINB12, FOLATE, FERRITIN, TIBC, IRON, RETICCTPCT in the last 72 hours. Urine analysis:    Component Value Date/Time   COLORURINE AMBER (A) 09/09/2015 1412   APPEARANCEUR CLOUDY (A) 09/09/2015 1412   LABSPEC 1.019 09/09/2015 1412   PHURINE 5.5 09/09/2015 1412   GLUCOSEU NEGATIVE 09/09/2015 1412   HGBUR LARGE (A) 09/09/2015 1412   BILIRUBINUR NEGATIVE 09/09/2015 1412   KETONESUR 15 (A) 09/09/2015 1412   PROTEINUR 30 (A) 09/09/2015 1412   UROBILINOGEN 1.0 04/24/2013 1420   NITRITE POSITIVE (A) 09/09/2015 1412   LEUKOCYTESUR MODERATE (A) 09/09/2015 1412   Sepsis Labs: @LABRCNTIP (procalcitonin:4,lacticidven:4) )No results found for this or any previous visit (from the past 240 hour(s)).   Radiological Exams on Admission: Dg Chest 1 View  Result Date: 03/18/2017 CLINICAL DATA:  83 year old male with fall. EXAM: CHEST 1 VIEW COMPARISON:  Chest radiograph dated 12/13/2016 FINDINGS: Right lung base atelectatic changes. A small right pleural effusion may be present. Overall the aeration of the lungs is significantly improved compared to prior radiograph. The left lung is clear. There is no pneumothorax. There is mild cardiomegaly. Atherosclerotic calcification of  the aortic arch and thoracic aorta. Osteopenia. No acute osseous pathology. IMPRESSION: Right lung base atelectatic changes and possible small right pleural effusion. Overall significant improvement in aeration of the lungs compared to the prior radiograph. Electronically Signed   By: Anner Crete M.D.   On: 03/18/2017 00:06   Dg Pelvis 1-2 Views  Result Date: 03/18/2017 CLINICAL DATA:  Fall.  Right hip pain. EXAM: PELVIS - 1-2 VIEW COMPARISON:  09/11/2015 CT abdomen/pelvis. FINDINGS: There is a suggestion of slight cortical irregularity in right pubic bone, cannot exclude a nondisplaced fracture in this location. No additional potential pelvic fracture. No pelvic diastasis. No evidence of hip dislocation on this single frontal view. No suspicious focal osseous lesions. Degenerative changes in the visualized lower lumbar spine. Bilateral hip osteoarthritis. IMPRESSION: Suggestion of slight cortical irregularity in the right pubic bone, cannot exclude a nondisplaced fracture in this location. No additional potential pelvic fracture. No pelvic diastasis. No evidence of hip dislocation on this single frontal view. Electronically Signed   By: Ilona Sorrel M.D.   On: 03/18/2017 00:09   Dg Knee 2 Views Right  Result Date: 03/18/2017 CLINICAL DATA:  Fall.  Right knee pain. EXAM: RIGHT KNEE - 1-2 VIEW COMPARISON:  None. FINDINGS: No right knee fracture, joint effusion or dislocation. Meniscal chondrocalcinosis. Mild medial and lateral compartment osteoarthritis in the right knee joint. No suspicious focal osseous lesions. Vascular calcifications throughout the posterior soft tissues. No radiopaque foreign body. IMPRESSION: 1. No right knee fracture, joint effusion or dislocation. 2. Mild medial and lateral compartment right knee osteoarthritis, probably due to CPPD arthropathy given the meniscal chondrocalcinosis. Electronically Signed   By: Ilona Sorrel M.D.   On: 03/18/2017 00:05   Ct Head Wo  Contrast  Result Date: 03/18/2017 CLINICAL DATA:  Unwitnessed fall.  Alzheimer disease. EXAM: CT HEAD WITHOUT CONTRAST CT CERVICAL SPINE WITHOUT CONTRAST TECHNIQUE: Multidetector CT imaging of the head and cervical spine was performed following the standard protocol without intravenous contrast. Multiplanar CT image reconstructions of the cervical spine were also generated. COMPARISON:  05/09/2008 head CT. FINDINGS: CT HEAD FINDINGS Brain: No evidence of parenchymal hemorrhage or extra-axial fluid collection. No mass lesion, mass effect, or midline shift. No CT evidence of acute infarction. Generalized cerebral volume loss. Focal encephalomalacia in the right parietal lobe  and left cerebellar hemisphere. Nonspecific mild to moderate subcortical and periventricular white matter hypodensity, most in keeping with chronic small vessel ischemic change. Cerebral ventricle sizes are concordant with the degree of cerebral volume loss. Vascular: No acute abnormality. Skull: No evidence of calvarial fracture. Sinuses/Orbits: The visualized paranasal sinuses are essentially clear. Other:  The mastoid air cells are unopacified. CT CERVICAL SPINE FINDINGS Alignment: Normal cervical lordosis. No facet subluxation. Dens is well positioned between the lateral masses of C1. There is 2 mm retrolisthesis at C5-6. There is 3 mm anterolisthesis at C7-T1. Skull base and vertebrae: No acute fracture. No primary bone lesion or focal pathologic process. Soft tissues and spinal canal: No prevertebral edema. No visible canal hematoma. Disc levels: Severe multilevel degenerative disc disease throughout the cervical spine. Severe bilateral facet arthropathy. Moderate degenerative foraminal stenosis on the left at C3-4. Mild degenerative foraminal stenosis bilaterally at C4-5. Moderate degenerative foraminal stenosis on the right at C5-6. Upper chest: No acute abnormality. Other: Visualized mastoid air cells appear clear. No discrete thyroid  nodules. No pathologically enlarged cervical nodes. IMPRESSION: CT HEAD: 1. No evidence of acute intracranial abnormality. No evidence of calvarial fracture. 2. Generalized cerebral volume loss and chronic small vessel ischemic changes in the cerebral white matter. 3. Encephalomalacia in the right parietal lobe and left cerebellar hemisphere. CT CERVICAL SPINE: 1. No cervical spine fracture or facet subluxation. 2. Severe multilevel degenerative changes throughout the cervical spine as detailed. 3. Mild multilevel cervical spine spondylolisthesis is probably degenerative. Electronically Signed   By: Ilona Sorrel M.D.   On: 03/18/2017 00:02   Ct Cervical Spine Wo Contrast  Result Date: 03/18/2017 CLINICAL DATA:  Unwitnessed fall.  Alzheimer disease. EXAM: CT HEAD WITHOUT CONTRAST CT CERVICAL SPINE WITHOUT CONTRAST TECHNIQUE: Multidetector CT imaging of the head and cervical spine was performed following the standard protocol without intravenous contrast. Multiplanar CT image reconstructions of the cervical spine were also generated. COMPARISON:  05/09/2008 head CT. FINDINGS: CT HEAD FINDINGS Brain: No evidence of parenchymal hemorrhage or extra-axial fluid collection. No mass lesion, mass effect, or midline shift. No CT evidence of acute infarction. Generalized cerebral volume loss. Focal encephalomalacia in the right parietal lobe and left cerebellar hemisphere. Nonspecific mild to moderate subcortical and periventricular white matter hypodensity, most in keeping with chronic small vessel ischemic change. Cerebral ventricle sizes are concordant with the degree of cerebral volume loss. Vascular: No acute abnormality. Skull: No evidence of calvarial fracture. Sinuses/Orbits: The visualized paranasal sinuses are essentially clear. Other:  The mastoid air cells are unopacified. CT CERVICAL SPINE FINDINGS Alignment: Normal cervical lordosis. No facet subluxation. Dens is well positioned between the lateral masses of  C1. There is 2 mm retrolisthesis at C5-6. There is 3 mm anterolisthesis at C7-T1. Skull base and vertebrae: No acute fracture. No primary bone lesion or focal pathologic process. Soft tissues and spinal canal: No prevertebral edema. No visible canal hematoma. Disc levels: Severe multilevel degenerative disc disease throughout the cervical spine. Severe bilateral facet arthropathy. Moderate degenerative foraminal stenosis on the left at C3-4. Mild degenerative foraminal stenosis bilaterally at C4-5. Moderate degenerative foraminal stenosis on the right at C5-6. Upper chest: No acute abnormality. Other: Visualized mastoid air cells appear clear. No discrete thyroid nodules. No pathologically enlarged cervical nodes. IMPRESSION: CT HEAD: 1. No evidence of acute intracranial abnormality. No evidence of calvarial fracture. 2. Generalized cerebral volume loss and chronic small vessel ischemic changes in the cerebral white matter. 3. Encephalomalacia in the right parietal lobe and left  cerebellar hemisphere. CT CERVICAL SPINE: 1. No cervical spine fracture or facet subluxation. 2. Severe multilevel degenerative changes throughout the cervical spine as detailed. 3. Mild multilevel cervical spine spondylolisthesis is probably degenerative. Electronically Signed   By: Ilona Sorrel M.D.   On: 03/18/2017 00:02   Ct Hip Right Wo Contrast  Result Date: 03/18/2017 CLINICAL DATA:  84 year old male with fall and right lower extremity pain. EXAM: CT OF THE RIGHT HIP WITHOUT CONTRAST TECHNIQUE: Multidetector CT imaging of the right hip was performed according to the standard protocol. Multiplanar CT image reconstructions were also generated. COMPARISON:  Radiograph dated 03/17/2017 and CT of the abdomen pelvis dated 09/11/2015 FINDINGS: Bones/Joint/Cartilage Evaluation is limited due to advanced osteopenia. There is no displaced fracture. Focal cortical discontinuity of the greater trochanter of the femur (coronal series 8, image  61 and sagittal series 9, image 19) may be chronic. A nondisplaced cortical fracture is not entirely excluded. There is also discontinuity of the inferior right sacral alum (coronal series 8 image 60 and 61) which may represent chronic changes and osteopenia although a fracture is not entirely excluded. There irregularity of the right superior and inferior pubic rami suspicious for acute nondisplaced fractures of the right superior and inferior pubic rami (series 9, image 73). There is moderate osteoarthritic changes of the right hip. No joint effusion. Ligaments Suboptimally assessed by CT. Muscles and Tendons No intramuscular hematoma. Soft tissues Trabecular appearance of the bladder wall likely related to chronic bladder outlet obstruction. Partially visualized enlarged prostate gland. There is advanced atherosclerotic calcification of the visualized vasculature. IMPRESSION: 1. Limited exam due to advanced osteopenia. No displaced femoral fracture. No dislocation. Linear lucency in the cortex of the greater trochanter of the femur may be chronic and less likely nondisplaced cortical fracture. 2. Findings suspicious for nondisplaced fractures of the right superior and inferior pubic rami. 3. Vertical lucency in the lateral right sacral alum with discontinuity of the inferior cortex of the right sacral alum may be chronic or related osteopenia. A nondisplaced fracture is not entirely excluded. Clinical correlation is recommended. Electronically Signed   By: Anner Crete M.D.   On: 03/18/2017 01:25   Dg Femur Min 2 Views Right  Result Date: 03/18/2017 CLINICAL DATA:  Fall.  Right hip pain. EXAM: RIGHT FEMUR 2 VIEWS COMPARISON:  None. FINDINGS: No fracture. No suspicious focal osseous lesion. No dislocation at the right hip or right knee. Moderate osteoarthritis in the right hip joint. Moderate medial and lateral compartment right knee osteoarthritis. Extensive vascular calcifications throughout the soft  tissues. No radiopaque foreign body. IMPRESSION: No fracture in the right femur. Electronically Signed   By: Ilona Sorrel M.D.   On: 03/18/2017 00:04    EKG: Independently reviewed.  Sinus tachycardia and PVCs.  Assessment/Plan Principal Problem:   Fall Active Problems:   Chronic kidney disease (CKD) stage G3a/A3, moderately decreased glomerular filtration rate (GFR) between 45-59 mL/min/1.73 square meter and albuminuria creatinine ratio greater than 300 mg/g (HCC)   Atrial fibrillation (HCC)   Alzheimer disease   Aortic stenosis   Hypothyroidism    1. Fall with possible right superior and inferior pubic rami fracture with possible sacral fracture and there is also concern for right hip greater trochanteric fracture -we will get physical therapy consult and pain relief.  I have ordered MRI of the hip to further assess the right greater trochanter fracture.  May consult orthopedics in a.m. 2. Cardiomyopathy last EF measured in October 2018 was 40% with critical aortic  stenosis -per cardiology patient was deemed not a candidate for any intervention and was placed on Lasix.  Lasix will be continued.  Patient appears compensated.  Patient is not a candidate for beta-blockers due to critical aortic stenosis as per the cardiology notes last. 3. Hypothyroidism on Synthroid. 4. A. fib presently rate controlled.  Note that patient was not considered a candidate for beta-blockers due to critical aortic stenosis. 5. Chronic kidney disease stage IV -creatinine appears to be at baseline. 6. Chronic anemia likely from chronic kidney disease and iron deficiency-follow CBC.  On iron supplements. 7. Stable dementia presently no acute issues.   DVT prophylaxis: SCDs until MRI results available. Code Status: DNR. Family Communication: No family at the bedside. Disposition Plan: Skilled nursing facility. Consults called: Physical therapy. Admission status: Observation.   Rise Patience MD [facility] Pager [phone].  If 7PM-7AM, please contact night-coverage [email or web address] Password [facility]  03/18/2017, 2:53 AM

## 2017-03-18 NOTE — Plan of Care (Signed)
  Progressing Education: Knowledge of General Education information will improve 03/18/2017 1156 - Progressing by Rance Muir, RN Health Behavior/Discharge Planning: Ability to manage health-related needs will improve 03/18/2017 1156 - Progressing by Rance Muir, RN Clinical Measurements: Ability to maintain clinical measurements within normal limits will improve 03/18/2017 1156 - Progressing by Rance Muir, RN Will remain free from infection 03/18/2017 1156 - Progressing by Rance Muir, RN Diagnostic test results will improve 03/18/2017 1156 - Progressing by Rance Muir, RN Respiratory complications will improve 03/18/2017 1156 - Progressing by Rance Muir, RN Cardiovascular complication will be avoided 03/18/2017 1156 - Progressing by Rance Muir, RN Activity: Risk for activity intolerance will decrease 03/18/2017 1156 - Progressing by Rance Muir, RN Nutrition: Adequate nutrition will be maintained 03/18/2017 1156 - Progressing by Rance Muir, RN Coping: Level of anxiety will decrease 03/18/2017 1156 - Progressing by Rance Muir, RN Elimination: Will not experience complications related to bowel motility 03/18/2017 1156 - Progressing by Rance Muir, RN Will not experience complications related to urinary retention 03/18/2017 1156 - Progressing by Rance Muir, RN Pain Managment: General experience of comfort will improve 03/18/2017 1156 - Progressing by Rance Muir, RN Safety: Ability to remain free from injury will improve 03/18/2017 1156 - Progressing by Rance Muir, RN Skin Integrity: Risk for impaired skin integrity will decrease 03/18/2017 1156 - Progressing by Rance Muir, RN

## 2017-03-18 NOTE — ED Notes (Signed)
ED Provider at bedside. 

## 2017-03-18 NOTE — Progress Notes (Signed)
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 Diagnoses of Pain, Leg pain, right, and Closed nondisplaced fracture of pelvis, unspecified part of pelvis, initial encounter [facility]) were also pertinent to this visit.  Right hip pain query fracture-MRI confirms intertrochanteric, CT hip right superior, inferior pubic rami fractures, possible fracture right sacrum-await MRI regarding whether this is a hip fracture-then planning with family- recently not candidate for cardiac surgery -was DNR and family would not have wanted procedures performed  Atrial fibrillation chads score >3-not a candidate for anticoagulation secondary to falls and recent GI bleed.  Currently rate controlled but not on meds previously has been on digoxin-we will monitor on telemetry  Chronic kidney disease stage III-baseline seems to be 20/1.9-slightly elevated 36/2.18.  Recheck and consider IV saline  Normocytic anemia-Has prior history of peptic ulcer disease and partial gastrectomy-no further role for any other intervention at this stage  impaired glucose tol-observe  Dementia-at baseline he is total assist as of recently-stays at the [facility] at [employer] sometimes very disoriented, cannot remember parents that passed away--sometimes is better on some days than others-not good enough to be on his own.  DVT prophylaxis: lovenox  Code Status:   dnar   Family Communication:   None bedside-called Daughter to update and ensure aware of issues--Son is  HCPOA [phone]-will update later today Disposition Plan: inpatient-might need surgery and decision regarding the same from Ortho   Consultants:    none yet  Procedures:    none  Antimicrobials:   noen    Subjective:  Awake alert can orient to person, byut not clearly place.  Not in pain--moving le's slowly  Objective: Vitals:   03/18/17 0445 03/18/17 0551 03/18/17 0600 03/18/17 0720  BP: 112/74 125/80 95/69 93/81   Pulse: 100 100  (!) 108  Resp: 14 16 14 18   Temp:      TempSrc:      SpO2: 94% 96% 99% 96%  Weight:      Height:       No intake or output data in the 24 hours ending 03/18/17 0726 Filed Weights   03/17/17 2234  Weight: 61.2 kg (135 lb)    Examination:   Awake alert pleasant in nad cta b Cannot follow neuro exam   Data Reviewed: I have personally reviewed following labs and imaging studies  CBC: Recent Labs  Lab 03/17/17 2257 03/18/17 0323  WBC 10.7* 10.0  NEUTROABS 8.9*  --   HGB 12.7* 12.1*  HCT 38.4* 36.2*  MCV 85.5 85.2  PLT 195 983   Basic Metabolic Panel: Recent Labs  Lab 03/17/17 2257 03/18/17 0323  NA 137 137  K 4.5 4.4  CL 100* 100*  CO2 26 26  GLUCOSE 153* 137*  BUN 36* 36*  CREATININE 2.21* 2.18*  CALCIUM 8.8* 8.6*   GFR: Estimated Creatinine Clearance: 19.1 mL/min (A) (by C-G formula based on SCr of 2.18 mg/dL (H)). Liver Function Tests: Recent Labs  Lab 03/17/17 2257  AST 29  ALT 20  ALKPHOS 68  BILITOT 1.1  PROT 5.6*  ALBUMIN 3.0*  No results for input(s): LIPASE, AMYLASE in the last 168 hours. No results for input(s): AMMONIA in the last 168 hours. Coagulation Profile: No results for input(s): INR, PROTIME in the last 168 hours. Cardiac Enzymes: No results for input(s): CKTOTAL, CKMB, CKMBINDEX, TROPONINI in the last 168 hours. CBG: No results for input(s): GLUCAP in the last 168 hours. Urine analysis:    Component Value Date/Time   COLORURINE AMBER (A) 09/09/2015 1412   APPEARANCEUR  CLOUDY (A) 09/09/2015 1412   LABSPEC 1.019 09/09/2015 1412   PHURINE 5.5 09/09/2015 1412   GLUCOSEU NEGATIVE 09/09/2015 1412   HGBUR LARGE (A) 09/09/2015 1412   BILIRUBINUR NEGATIVE 09/09/2015 1412   KETONESUR 15 (A) 09/09/2015 1412   PROTEINUR 30 (A) 09/09/2015 1412   UROBILINOGEN 1.0 04/24/2013 1420   NITRITE POSITIVE (A) 09/09/2015 1412   LEUKOCYTESUR MODERATE (A) 09/09/2015 1412     Radiology Studies: Reviewed images personally in health database    Scheduled Meds: . aspirin EC  81 mg Oral Daily  . cholecalciferol  1,000 Units Oral Daily  . docusate sodium  100 mg Oral BID  . Dutasteride-Tamsulosin HCl  1 capsule Oral Daily  . ENSURE  237 mL Oral Daily  . furosemide  40 mg Oral Daily  . iron polysaccharides  150 mg Oral BID  . levothyroxine  50 mcg Oral QAC breakfast  . multivitamin with minerals   Oral Daily  . pantoprazole  40 mg Oral Daily  . potassium chloride  10 mEq Oral Daily   Continuous Infusions:   LOS: 0 days    Time spent: Armstrong, MD [facility] ([facility]   If 7PM-7AM, please contact night-coverage [email or web address] Password [facility] 03/18/2017, 7:26 AM

## 2017-03-18 NOTE — ED Provider Notes (Signed)
Patient with fall and inability to bear weight on the right leg, signed out to me pending results of CT of right hip.  CT is reported to be suboptimal due to osteoporosis, questionable cortical fracture of the greater trochanter and probable fractures of the superior and inferior pubic rami and possible fracture of the right sacral alum.  None of these are surgical injuries.  He will be admitted for orthopedic evaluation, may need to consider MRI for better characterization of bony injuries.  He will need long-term placement until able to ambulate.  Case is discussed with Dr. Hal Hope of [facility] who agrees to admit the patient.   Delora Fuel, MD 16/10/96 [phone]

## 2017-03-18 NOTE — ED Notes (Signed)
Patient transported to CT 

## 2017-03-18 NOTE — ED Notes (Signed)
Patient transported to MRI 

## 2017-03-18 NOTE — Consult Note (Signed)
Reason for Consult: Right hip pain Referring Physician Dr Verlon Au:   Justin Chambers is an 84 y.o. male.  HPI: Justin Chambers is a 84 year old patient with multiple medical comorbidities.  He sustained a fall and reports right hip pain.  Subsequent workup demonstrates rami fracture nondisplaced.  He also has nondisplaced peritrochanteric fracture and nondisplaced sacral fracture.  All of these on the right-hand side.  The patient reports fairly significant pain and has not been able to ambulate on the right-hand side.  He denies any other orthopedic complaints  Past Medical History:  Diagnosis Date  . Alzheimer disease   . Anemia due to chronic blood loss 07/2015  . Atrial fibrillation ([place])   . Cholecystitis   . Chronic kidney disease   . CKD (chronic kidney disease)   . Dementia   . DOE (dyspnea on exertion)   . Fracture    right leg(casted- high school), collar bone(child)  . Hypothyroidism   . Transfusion history    in past ? when    Past Surgical History:  Procedure Laterality Date  . ABDOMINAL SURGERY     part of stomach removed  . CHOLECYSTECTOMY N/A 04/27/2013   Procedure: LAPAROSCOPIC CHOLECYSTECTOMY WITH INTRAOPERATIVE CHOLANGIOGRAM;  Surgeon: Imogene Burn. [name], MD;  Location: [place];  Service: General;  Laterality: N/A;  . ESOPHAGOGASTRODUODENOSCOPY N/A 07/30/2015   Procedure: ESOPHAGOGASTRODUODENOSCOPY (EGD);  Surgeon: Ronald Lobo, MD;  Location[facility] END[facility]COPY;  Service: Endoscopy;  Laterality: N/A;  Pediatric UPPER endoscope, please  . LAPAROSCOPIC LYSIS OF ADHESIONS N/A 04/27/2013   Procedure: LAPAROSCOPIC LYSIS OF ADHESIONS;  Surgeon: Imogene Burn. [name], MD;  Location: [place] OR;  Service: General;  Laterality: N/A;  . PARTIAL GASTRECTOMY Bilateral   . TONSILLECTOMY      Family History  Problem Relation Age of Onset  . Diabetes Neg Hx   . CAD Neg Hx     Social History:  reports that  has never smoked. he has never used smokeless tobacco. He reports that he does not drink  alcohol or use drugs.  Allergies:  Allergies  Allergen Reactions  . Latex   . Savaysa [Edoxaban] Other (See Comments)    Caused Hgb to drop- adverse reaction, not allergy    Medications: I have reviewed the patient's current medications.  Results for orders placed or performed during the hospital encounter of 03/17/17 (from the past 48 hour(s))  CBC with Differential     Status: Abnormal   Collection Time: 03/17/17 10:57 PM  Result Value Ref Range   WBC 10.7 (H) 4.0 - 10.5 K/uL   RBC 4.49 4.22 - 5.81 MIL/uL   Hemoglobin 12.7 (L) 13.0 - 17.0 g/dL   HCT 38.4 (L) 39.0 - 52.0 %   MCV 85.5 78.0 - 100.0 fL   MCH 28.3 26.0 - 34.0 pg   MCHC 33.1 30.0 - 36.0 g/dL   RDW 16.0 (H) 11.5 - 15.5 %   Platelets 195 150 - 400 K/uL   Neutrophils Relative % 84 %   Lymphocytes Relative 9 %   Monocytes Relative 7 %   Eosinophils Relative 0 %   Basophils Relative 0 %   Neutro Abs 8.9 (H) 1.7 - 7.7 K/uL   Lymphs Abs 1.0 0.7 - 4.0 K/uL   Monocytes Absolute 0.8 0.1 - 1.0 K/uL   Eosinophils Absolute 0.0 0.0 - 0.7 K/uL   Basophils Absolute 0.0 0.0 - 0.1 K/uL   WBC Morphology ATYPICAL LYMPHOCYTES   Comprehensive metabolic panel     Status: Abnormal  Collection Time: 03/17/17 10:57 PM  Result Value Ref Range   Sodium 137 135 - 145 mmol/L   Potassium 4.5 3.5 - 5.1 mmol/L   Chloride 100 (L) 101 - 111 mmol/L   CO2 26 22 - 32 mmol/L   Glucose, Bld 153 (H) 65 - 99 mg/dL   BUN 36 (H) 6 - 20 mg/dL   Creatinine, Ser 2.21 (H) 0.61 - 1.24 mg/dL   Calcium 8.8 (L) 8.9 - 10.3 mg/dL   Total Protein 5.6 (L) 6.5 - 8.1 g/dL   Albumin 3.0 (L) 3.5 - 5.0 g/dL   AST 29 15 - 41 U/L   ALT 20 17 - 63 U/L   Alkaline Phosphatase 68 38 - 126 U/L   Total Bilirubin 1.1 0.3 - 1.2 mg/dL   GFR calc non Af Amer 24 (L) >60 mL/min   GFR calc Af Amer 28 (L) >60 mL/min    Comment: (NOTE) The eGFR has been calculated using the CKD EPI equation. This calculation has not been validated in all clinical situations. eGFR's  persistently <60 mL/min signify possible Chronic Kidney Disease.    Anion gap 11 5 - 15  Basic metabolic panel     Status: Abnormal   Collection Time: 03/18/17  3:23 AM  Result Value Ref Range   Sodium 137 135 - 145 mmol/L   Potassium 4.4 3.5 - 5.1 mmol/L   Chloride 100 (L) 101 - 111 mmol/L   CO2 26 22 - 32 mmol/L   Glucose, Bld 137 (H) 65 - 99 mg/dL   BUN 36 (H) 6 - 20 mg/dL   Creatinine, Ser 2.18 (H) 0.61 - 1.24 mg/dL   Calcium 8.6 (L) 8.9 - 10.3 mg/dL   GFR calc non Af Amer 25 (L) >60 mL/min   GFR calc Af Amer 29 (L) >60 mL/min    Comment: (NOTE) The eGFR has been calculated using the CKD EPI equation. This calculation has not been validated in all clinical situations. eGFR's persistently <60 mL/min signify possible Chronic Kidney Disease.    Anion gap 11 5 - 15  CBC     Status: Abnormal   Collection Time: 03/18/17  3:23 AM  Result Value Ref Range   WBC 10.0 4.0 - 10.5 K/uL   RBC 4.25 4.22 - 5.81 MIL/uL   Hemoglobin 12.1 (L) 13.0 - 17.0 g/dL   HCT 36.2 (L) 39.0 - 52.0 %   MCV 85.2 78.0 - 100.0 fL   MCH 28.5 26.0 - 34.0 pg   MCHC 33.4 30.0 - 36.0 g/dL   RDW 16.1 (H) 11.5 - 15.5 %   Platelets 178 150 - 400 K/uL  Troponin I     Status: Abnormal   Collection Time: 03/18/17  9:47 AM  Result Value Ref Range   Troponin I 0.04 (HH) <0.03 ng/mL    Comment: CRITICAL RESULT CALLED TO, READ BACK BY AND VERIFIED WITH: DANA COX,RN AT 1118 03/18/17 BY ZBEECH.   Troponin I     Status: Abnormal   Collection Time: 03/18/17  1:08 PM  Result Value Ref Range   Troponin I 0.04 (HH) <0.03 ng/mL    Comment: CRITICAL VALUE NOTED.  VALUE IS CONSISTENT WITH PREVIOUSLY REPORTED AND CALLED VALUE.    Dg Chest 1 View  Result Date: 03/18/2017 CLINICAL DATA:  84 year old male with fall. EXAM: CHEST 1 VIEW COMPARISON:  Chest radiograph dated 12/13/2016 FINDINGS: Right lung base atelectatic changes. A small right pleural effusion may be present. Overall the aeration of the lungs  is significantly  improved compared to prior radiograph. The left lung is clear. There is no pneumothorax. There is mild cardiomegaly. Atherosclerotic calcification of the aortic arch and thoracic aorta. Osteopenia. No acute osseous pathology. IMPRESSION: Right lung base atelectatic changes and possible small right pleural effusion. Overall significant improvement in aeration of the lungs compared to the prior radiograph. Electronically Signed   By: Anner Crete M.D.   On: 03/18/2017 00:06   Dg Pelvis 1-2 Views  Result Date: 03/18/2017 CLINICAL DATA:  Fall.  Right hip pain. EXAM: PELVIS - 1-2 VIEW COMPARISON:  09/11/2015 CT abdomen/pelvis. FINDINGS: There is a suggestion of slight cortical irregularity in right pubic bone, cannot exclude a nondisplaced fracture in this location. No additional potential pelvic fracture. No pelvic diastasis. No evidence of hip dislocation on this single frontal view. No suspicious focal osseous lesions. Degenerative changes in the visualized lower lumbar spine. Bilateral hip osteoarthritis. IMPRESSION: Suggestion of slight cortical irregularity in the right pubic bone, cannot exclude a nondisplaced fracture in this location. No additional potential pelvic fracture. No pelvic diastasis. No evidence of hip dislocation on this single frontal view. Electronically Signed   By: Ilona Sorrel M.D.   On: 03/18/2017 00:09   Dg Knee 2 Views Right  Result Date: 03/18/2017 CLINICAL DATA:  Fall.  Right knee pain. EXAM: RIGHT KNEE - 1-2 VIEW COMPARISON:  None. FINDINGS: No right knee fracture, joint effusion or dislocation. Meniscal chondrocalcinosis. Mild medial and lateral compartment osteoarthritis in the right knee joint. No suspicious focal osseous lesions. Vascular calcifications throughout the posterior soft tissues. No radiopaque foreign body. IMPRESSION: 1. No right knee fracture, joint effusion or dislocation. 2. Mild medial and lateral compartment right knee osteoarthritis, probably due to CPPD  arthropathy given the meniscal chondrocalcinosis. Electronically Signed   By: Ilona Sorrel M.D.   On: 03/18/2017 00:05   Ct Head Wo Contrast  Result Date: 03/18/2017 CLINICAL DATA:  Unwitnessed fall.  Alzheimer disease. EXAM: CT HEAD WITHOUT CONTRAST CT CERVICAL SPINE WITHOUT CONTRAST TECHNIQUE: Multidetector CT imaging of the head and cervical spine was performed following the standard protocol without intravenous contrast. Multiplanar CT image reconstructions of the cervical spine were also generated. COMPARISON:  05/09/2008 head CT. FINDINGS: CT HEAD FINDINGS Brain: No evidence of parenchymal hemorrhage or extra-axial fluid collection. No mass lesion, mass effect, or midline shift. No CT evidence of acute infarction. Generalized cerebral volume loss. Focal encephalomalacia in the right parietal lobe and left cerebellar hemisphere. Nonspecific mild to moderate subcortical and periventricular white matter hypodensity, most in keeping with chronic small vessel ischemic change. Cerebral ventricle sizes are concordant with the degree of cerebral volume loss. Vascular: No acute abnormality. Skull: No evidence of calvarial fracture. Sinuses/Orbits: The visualized paranasal sinuses are essentially clear. Other:  The mastoid air cells are unopacified. CT CERVICAL SPINE FINDINGS Alignment: Normal cervical lordosis. No facet subluxation. Dens is well positioned between the lateral masses of C1. There is 2 mm retrolisthesis at C5-6. There is 3 mm anterolisthesis at C7-T1. Skull base and vertebrae: No acute fracture. No primary bone lesion or focal pathologic process. Soft tissues and spinal canal: No prevertebral edema. No visible canal hematoma. Disc levels: Severe multilevel degenerative disc disease throughout the cervical spine. Severe bilateral facet arthropathy. Moderate degenerative foraminal stenosis on the left at C3-4. Mild degenerative foraminal stenosis bilaterally at C4-5. Moderate degenerative foraminal  stenosis on the right at C5-6. Upper chest: No acute abnormality. Other: Visualized mastoid air cells appear clear. No discrete thyroid nodules. No  pathologically enlarged cervical nodes. IMPRESSION: CT HEAD: 1. No evidence of acute intracranial abnormality. No evidence of calvarial fracture. 2. Generalized cerebral volume loss and chronic small vessel ischemic changes in the cerebral white matter. 3. Encephalomalacia in the right parietal lobe and left cerebellar hemisphere. CT CERVICAL SPINE: 1. No cervical spine fracture or facet subluxation. 2. Severe multilevel degenerative changes throughout the cervical spine as detailed. 3. Mild multilevel cervical spine spondylolisthesis is probably degenerative. Electronically Signed   By: Ilona Sorrel M.D.   On: 03/18/2017 00:02   Ct Cervical Spine Wo Contrast  Result Date: 03/18/2017 CLINICAL DATA:  Unwitnessed fall.  Alzheimer disease. EXAM: CT HEAD WITHOUT CONTRAST CT CERVICAL SPINE WITHOUT CONTRAST TECHNIQUE: Multidetector CT imaging of the head and cervical spine was performed following the standard protocol without intravenous contrast. Multiplanar CT image reconstructions of the cervical spine were also generated. COMPARISON:  05/09/2008 head CT. FINDINGS: CT HEAD FINDINGS Brain: No evidence of parenchymal hemorrhage or extra-axial fluid collection. No mass lesion, mass effect, or midline shift. No CT evidence of acute infarction. Generalized cerebral volume loss. Focal encephalomalacia in the right parietal lobe and left cerebellar hemisphere. Nonspecific mild to moderate subcortical and periventricular white matter hypodensity, most in keeping with chronic small vessel ischemic change. Cerebral ventricle sizes are concordant with the degree of cerebral volume loss. Vascular: No acute abnormality. Skull: No evidence of calvarial fracture. Sinuses/Orbits: The visualized paranasal sinuses are essentially clear. Other:  The mastoid air cells are unopacified. CT  CERVICAL SPINE FINDINGS Alignment: Normal cervical lordosis. No facet subluxation. Dens is well positioned between the lateral masses of C1. There is 2 mm retrolisthesis at C5-6. There is 3 mm anterolisthesis at C7-T1. Skull base and vertebrae: No acute fracture. No primary bone lesion or focal pathologic process. Soft tissues and spinal canal: No prevertebral edema. No visible canal hematoma. Disc levels: Severe multilevel degenerative disc disease throughout the cervical spine. Severe bilateral facet arthropathy. Moderate degenerative foraminal stenosis on the left at C3-4. Mild degenerative foraminal stenosis bilaterally at C4-5. Moderate degenerative foraminal stenosis on the right at C5-6. Upper chest: No acute abnormality. Other: Visualized mastoid air cells appear clear. No discrete thyroid nodules. No pathologically enlarged cervical nodes. IMPRESSION: CT HEAD: 1. No evidence of acute intracranial abnormality. No evidence of calvarial fracture. 2. Generalized cerebral volume loss and chronic small vessel ischemic changes in the cerebral white matter. 3. Encephalomalacia in the right parietal lobe and left cerebellar hemisphere. CT CERVICAL SPINE: 1. No cervical spine fracture or facet subluxation. 2. Severe multilevel degenerative changes throughout the cervical spine as detailed. 3. Mild multilevel cervical spine spondylolisthesis is probably degenerative. Electronically Signed   By: Ilona Sorrel M.D.   On: 03/18/2017 00:02   Ct Hip Right Wo Contrast  Result Date: 03/18/2017 CLINICAL DATA:  84 year old male with fall and right lower extremity pain. EXAM: CT OF THE RIGHT HIP WITHOUT CONTRAST TECHNIQUE: Multidetector CT imaging of the right hip was performed according to the standard protocol. Multiplanar CT image reconstructions were also generated. COMPARISON:  Radiograph dated 03/17/2017 and CT of the abdomen pelvis dated 09/11/2015 FINDINGS: Bones/Joint/Cartilage Evaluation is limited due to advanced  osteopenia. There is no displaced fracture. Focal cortical discontinuity of the greater trochanter of the femur (coronal series 8, image 61 and sagittal series 9, image 19) may be chronic. A nondisplaced cortical fracture is not entirely excluded. There is also discontinuity of the inferior right sacral alum (coronal series 8 image 60 and 61) which may represent chronic  changes and osteopenia although a fracture is not entirely excluded. There irregularity of the right superior and inferior pubic rami suspicious for acute nondisplaced fractures of the right superior and inferior pubic rami (series 9, image 73). There is moderate osteoarthritic changes of the right hip. No joint effusion. Ligaments Suboptimally assessed by CT. Muscles and Tendons No intramuscular hematoma. Soft tissues Trabecular appearance of the bladder wall likely related to chronic bladder outlet obstruction. Partially visualized enlarged prostate gland. There is advanced atherosclerotic calcification of the visualized vasculature. IMPRESSION: 1. Limited exam due to advanced osteopenia. No displaced femoral fracture. No dislocation. Linear lucency in the cortex of the greater trochanter of the femur may be chronic and less likely nondisplaced cortical fracture. 2. Findings suspicious for nondisplaced fractures of the right superior and inferior pubic rami. 3. Vertical lucency in the lateral right sacral alum with discontinuity of the inferior cortex of the right sacral alum may be chronic or related osteopenia. A nondisplaced fracture is not entirely excluded. Clinical correlation is recommended. Electronically Signed   By: Anner Crete M.D.   On: 03/18/2017 01:25   Mr Hip Right Wo Contrast  Result Date: 03/18/2017 CLINICAL DATA:  Unwitnessed fall, right hip pain. EXAM: MR OF THE RIGHT HIP WITHOUT CONTRAST TECHNIQUE: Multiplanar, multisequence MR imaging was performed. No intravenous contrast was administered. COMPARISON:  CT from  03/18/2017 FINDINGS: Bones: Nondisplaced fracture the right superior pubic ramus extending into the pubic body and adjacent junction of the pubic body and right inferior pubic ramus. Low-level abnormal edema extending vertically in the right sacral ala noted on the coronal images, favoring fracture. Today's exam was focused on the right hip rather than the sacrum. Right-sided intertrochanteric fracture is present and nondisplaced. There is a transverse component in the greater trochanter, with the portion extending towards the lesser trochanter much less conspicuous but still thought to be present as on images 3 through 8 of series 5 and for example on image 17 of series 8. Articular cartilage and labrum Articular cartilage:  Severe degenerative chondral thinning Labrum: Abnormal linear increased signal along the deep margin of the right anterior superior labrum on images 16-19 of series 9 compatible with labral tear, likely chronic. Joint or bursal effusion Joint effusion:  Trace bilateral hip joint effusions. Bursae: No distinct bursitis, although fluid signal does track deep to the right iliotibial band. Muscles and tendons Muscles and tendons: Considerable edema around the right hip including the gluteus minimus and medius muscles, the right hip adductor musculature, and vastus lateralis muscle. Adjacent subcutaneous edema is also present. Other findings Miscellaneous: Indirect left inguinal hernia contains a loop of bowel. Enlarged prostate gland measures 5.4 by 4.9 by 5.4 cm (volume = 75 cm^3). Lower lumbar spondylosis and degenerative disc disease. IMPRESSION: 1. Nondisplaced right intertrochanteric fracture. 2. Nondisplaced fractures through the right superior pubic ramus, pubic body, and adjacent inferior pubic ramus. 3. Nondisplaced fracture of the right sacral ala. 4. Extensive muscular edema in the right gluteal musculature, right hip adductor musculature, and right vastus lateralis with fluid signal  tracking along the iliotibial band on the right. 5. Severe degenerative right hip arthropathy. 6. Chronic appearing right labral tear. 7. Trace bilateral hip joint effusions. 8. Indirect left inguinal hernia contains a loop of small bowel without findings of strangulation/obstruction. 9. Enlarged prostate gland. 10. Lower lumbar spondylosis and degenerative disc disease. Electronically Signed   By: Van Clines M.D.   On: 03/18/2017 08:18   Dg Femur Min 2 Views Right  Result  Date: 03/18/2017 CLINICAL DATA:  Fall.  Right hip pain. EXAM: RIGHT FEMUR 2 VIEWS COMPARISON:  None. FINDINGS: No fracture. No suspicious focal osseous lesion. No dislocation at the right hip or right knee. Moderate osteoarthritis in the right hip joint. Moderate medial and lateral compartment right knee osteoarthritis. Extensive vascular calcifications throughout the soft tissues. No radiopaque foreign body. IMPRESSION: No fracture in the right femur. Electronically Signed   By: Ilona Sorrel M.D.   On: 03/18/2017 00:04    Review of Systems  Unable to perform ROS: Dementia   Blood pressure 115/66, pulse 77, temperature 98.7 F (37.1 C), temperature source Oral, resp. rate 18, height _0  (1.753 m), weight 135 lb (61.2 kg), SpO2 98 %. Physical Exam  Constitutional: He appears well-developed.  HENT:  Head: Normocephalic.  Eyes: Pupils are equal, round, and reactive to light.  Neck: Normal range of motion.  Cardiovascular: Normal rate.  Respiratory: Effort normal.  Neurological: He is alert.  Skin: Skin is warm.  Psychiatric: His behavior is normal.  Examination demonstrates reasonable passive range of motion and active range of motion of the wrist elbows and shoulders.  He has only mild range of motion with passive circumduction of the right hip.  There is no knee or ankle swelling or effusion in either leg.  Ankle dorsiflexion plantarflexion is intact.  Assessment/Plan: Impression is multiple nondisplaced right  proximal femur rami and sacral alar fracture.  Recommend nonoperative treatment for these.  Anticipate they should heal on their own with a period of nonweightbearing for 3 weeks to 4 weeks.  Okay to pivot and transfer on the left-hand side.  He will need orthopedic follow-up in 3 weeks for repeat radiographical assessment  G Alphonzo Severance 03/18/2017, 8:18 PM

## 2017-03-18 NOTE — Progress Notes (Signed)
Critical troponin of 0.04 called to this RN from the lab.  Dr. Verlon Au contacted with trop level via anion.

## 2017-03-18 NOTE — ED Notes (Signed)
Pt's daughter updated on pt's status.

## 2017-03-18 NOTE — ED Notes (Signed)
Attempted report 

## 2017-03-18 NOTE — ED Notes (Signed)
Patient currently at MRI

## 2017-03-19 DIAGNOSIS — S72144A Nondisplaced intertrochanteric fracture of right femur, initial encounter for closed fracture: Secondary | ICD-10-CM | POA: Diagnosis not present

## 2017-03-19 DIAGNOSIS — G8911 Acute pain due to trauma: Secondary | ICD-10-CM | POA: Diagnosis not present

## 2017-03-19 DIAGNOSIS — S329XXA Fracture of unspecified parts of lumbosacral spine and pelvis, initial encounter for closed fracture: Secondary | ICD-10-CM | POA: Diagnosis not present

## 2017-03-19 DIAGNOSIS — R4182 Altered mental status, unspecified: Secondary | ICD-10-CM | POA: Diagnosis not present

## 2017-03-19 DIAGNOSIS — W19XXXA Unspecified fall, initial encounter: Secondary | ICD-10-CM | POA: Diagnosis not present

## 2017-03-19 LAB — PROTIME-INR
INR: 0.98
Prothrombin Time: 12.9 seconds (ref 11.4–15.2)

## 2017-03-19 LAB — COMPREHENSIVE METABOLIC PANEL
ALBUMIN: 2.6 g/dL — AB (ref 3.5–5.0)
ALK PHOS: 57 U/L (ref 38–126)
ALT: 16 U/L — ABNORMAL LOW (ref 17–63)
ANION GAP: 12 (ref 5–15)
AST: 18 U/L (ref 15–41)
BUN: 39 mg/dL — ABNORMAL HIGH (ref 6–20)
CALCIUM: 8.7 mg/dL — AB (ref 8.9–10.3)
CHLORIDE: 102 mmol/L (ref 101–111)
CO2: 24 mmol/L (ref 22–32)
Creatinine, Ser: 2.26 mg/dL — ABNORMAL HIGH (ref 0.61–1.24)
GFR calc non Af Amer: 24 mL/min — ABNORMAL LOW (ref >60)
GFR, EST AFRICAN AMERICAN: 27 mL/min — AB (ref >60)
GLUCOSE: 95 mg/dL (ref 65–99)
POTASSIUM: 4 mmol/L (ref 3.5–5.1)
SODIUM: 138 mmol/L (ref 135–145)
Total Bilirubin: 1.1 mg/dL (ref 0.3–1.2)
Total Protein: 5.1 g/dL — ABNORMAL LOW (ref 6.5–8.1)

## 2017-03-19 LAB — CBC WITH DIFFERENTIAL/PLATELET
BASOS ABS: 0 10*3/uL (ref 0.0–0.1)
BASOS PCT: 0 %
Eosinophils Absolute: 0.2 10*3/uL (ref 0.0–0.7)
Eosinophils Relative: 3 %
HEMATOCRIT: 34.6 % — AB (ref 39.0–52.0)
Hemoglobin: 11.7 g/dL — ABNORMAL LOW (ref 13.0–17.0)
LYMPHS PCT: 14 %
Lymphs Abs: 1 10*3/uL (ref 0.7–4.0)
MCH: 29 pg (ref 26.0–34.0)
MCHC: 33.8 g/dL (ref 30.0–36.0)
MCV: 85.6 fL (ref 78.0–100.0)
MONOS PCT: 10 %
Monocytes Absolute: 0.7 10*3/uL (ref 0.1–1.0)
NEUTROS ABS: 5.4 10*3/uL (ref 1.7–7.7)
NEUTROS PCT: 73 %
Platelets: 162 10*3/uL (ref 150–400)
RBC: 4.04 MIL/uL — ABNORMAL LOW (ref 4.22–5.81)
RDW: 16.4 % — ABNORMAL HIGH (ref 11.5–15.5)
WBC: 7.3 10*3/uL (ref 4.0–10.5)

## 2017-03-19 MED ORDER — TRAMADOL HCL 50 MG PO TABS: 50.0000 mg | ORAL_TABLET | Freq: Four times a day (QID) | ORAL | 0 refills | Status: AC | PRN

## 2017-03-19 NOTE — Social Work (Signed)
Clinical Social Worker facilitated patient discharge including contacting patient family and facility to confirm patient discharge plans.  Clinical information faxed to facility and family agreeable with plan.    CSW arranged ambulance transport via [facility] to [place].    RN to call [phone] or [phone] to give report prior to discharge.  Clinical Social Worker will sign off for now as social work intervention is no longer needed. Please consult [place] again if new need arises.  Elissa Hefty, LCSW Clinical Social Worker [phone]

## 2017-03-19 NOTE — Evaluation (Signed)
Physical Therapy Evaluation Patient Details Name: Justin Chambers MRN: [id number] DOB: [birth year] Today's Date: 03/19/2017   History of Present Illness  Pt is a 84 y/o male who presents s/p fall in which pt sustained a pubic rami fx, peritrochanteric fx, and sacral fx. He is being treated non-operatively, and is NWB on the RLE.   Clinical Impression  Pt admitted with above diagnosis. Pt currently with functional limitations due to the deficits listed below (see PT Problem List). At the time of PT eval pt was able to perform transfers with up to +2 max assist for balance support and safety. Pt will benefit from skilled PT to increase their independence and safety with mobility to allow discharge to the venue listed below.       Follow Up Recommendations SNF;Supervision/Assistance - 24 hour    Equipment Recommendations  Rolling walker with 5" wheels    Recommendations for Other Services       Precautions / Restrictions Precautions Precautions: Fall Restrictions Weight Bearing Restrictions: Yes RLE Weight Bearing: Non weight bearing      Mobility  Bed Mobility Overal bed mobility: Needs Assistance Bed Mobility: Supine to Sit     Supine to sit: Mod assist;+2 for physical assistance     General bed mobility comments: Pt was able to use bed rail for support. Bed pad utilized to assist with scooting. Pt was able to initiate movement, however therapist assisting with most of advancement of LE's.   Transfers Overall transfer level: Needs assistance Equipment used: 2 person hand held assist Transfers: Sit to/from [employer] Sit to Stand: Max assist;+2 physical assistance   Squat pivot transfers: Max assist;+2 physical assistance     General transfer comment: VC's for maintenance of precautions and hand placement on seated surface for safety. Pt was able to tolerate squat pivot around to the chair.   Ambulation/Gait             General Gait Details:  Unable  Stairs            Wheelchair Mobility    Modified Rankin (Stroke Patients Only)       Balance                                             Pertinent Vitals/Pain Pain Assessment: Faces Faces Pain Scale: Hurts little more Pain Location: hips/pelvis Pain Descriptors / Indicators: Discomfort;Sharp Pain Intervention(s): Limited activity within patient's tolerance;Monitored during session;Repositioned    Home Living Family/patient expects to be discharged to:: Skilled nursing facility                 Additional Comments: Pt. lives at memory care unit    Prior Function Level of Independence: Needs assistance         Comments: Pleasant, but overall poor historian and unable to get an idea of PLOF.     Hand Dominance   Dominant Hand: Right    Extremity/Trunk Assessment   Upper Extremity Assessment Upper Extremity Assessment: Defer to OT evaluation    Lower Extremity Assessment Lower Extremity Assessment: Generalized weakness(Acute pain and decreased strength consistent with above)    Cervical / Trunk Assessment Cervical / Trunk Assessment: Kyphotic  Communication   Communication: HOH  Cognition Arousal/Alertness: Awake/alert Behavior During Therapy: WFL for tasks assessed/performed Overall Cognitive Status: Within Functional Limits for tasks assessed  General Comments      Exercises General Exercises - Lower Extremity Quad Sets: 10 reps Long Arc Quad: 10 reps   Assessment/Plan    PT Assessment Patient needs continued PT services  PT Problem List Decreased strength;Decreased range of motion;Decreased activity tolerance;Decreased balance;Decreased mobility;Decreased knowledge of use of DME;Decreased safety awareness;Decreased knowledge of precautions;Pain       PT Treatment Interventions DME instruction;Gait training;Stair training;Functional mobility  training;Therapeutic activities;Therapeutic exercise;Neuromuscular re-education;Patient/family education    PT Goals (Current goals can be found in the Care Plan section)  Acute Rehab PT Goals Patient Stated Goal: Get better PT Goal Formulation: With patient Time For Goal Achievement: 03/26/17 Potential to Achieve Goals: Good    Frequency Min 3X/week   Barriers to discharge        Co-evaluation               AM-PAC PT "6 Clicks" Daily Activity  Outcome Measure Difficulty turning over in bed (including adjusting bedclothes, sheets and blankets)?: Unable Difficulty moving from lying on back to sitting on the side of the bed? : Unable Difficulty sitting down on and standing up from a chair with arms (e.g., wheelchair, bedside commode, etc,.)?: Unable Help needed moving to and from a bed to chair (including a wheelchair)?: Total Help needed walking in hospital room?: Total Help needed climbing 3-5 steps with a railing? : Total 6 Click Score: 6    End of Session Equipment Utilized During Treatment: Gait belt Activity Tolerance: Patient tolerated treatment well Patient left: in chair;with call bell/phone within reach Nurse Communication: Mobility status PT Visit Diagnosis: Pain;Difficulty in walking, not elsewhere classified (R26.2) Pain - Right/Left: Right Pain - part of body: Hip    Time: 3875-6433 PT Time Calculation (min) (ACUTE ONLY): 11 min   Charges:   PT Evaluation $PT Eval Moderate Complexity: 1 Mod     PT G Codes:        Rolinda Roan, PT, DPT Acute Rehabilitation Services Pager: [phone]   Thelma Comp 03/19/2017, 12:08 PM

## 2017-03-19 NOTE — Discharge Summary (Signed)
Physician Discharge Summary  Justin Chambers RJJ:[id number] DOB: September 17, 1923 DOA: 03/17/2017  PCP: Burnard Bunting, MD  Admit date: 03/17/2017 Discharge date: 03/19/2017  Time spent: 25 minutes  Recommendations for Outpatient Follow-up:  1. Non Weight bearing for 3 weeks-repeat xray and set up appt Dr. Marlou Sa in the office after this period of time 2. Please rwequest pallaitive care to follow and get GOals of care as OP 3. 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 Marlou Sa saw and rec 3 wk xray and non-op management-was DNR and family would not have wanted procedures performed  Atrial fibrillation chads score >3-not a candidate for anticoagulation secondary to falls and recent GI bleed.  Currently rate controlled but not on meds previously has been on digoxin  Chronic kidney disease stage III-baseline seems to be 20/1.9-slightly elevated 36/2.18. satble on d/c  Normocytic anemia-Has prior history  of peptic ulcer disease and partial gastrectomy-no further role for any other intervention at this stage  impaired glucose tol-observe  Dementia-at baseline he is total assist as of recently-stays at the [facility] [place]-mod 0-severe sometimes very disoriented, cannot remember parents that passed away--sometimes is better on some days than others-not good enough to be on his own.  Procedures: Mri ct  Consultations:  Dr. Marlou Sa  Discharge Exam: Vitals:   03/18/17 2053 03/19/17 0531  BP: 121/72 120/69  Pulse: (!) 115 97  Resp: 18 18  Temp: 98.3 F (36.8 C) 98.7 F (37.1 C)  SpO2: 97% 99%    General:  Awake alert pleasantly confused Not in pain Cardiovascular: s1 s 2no m/r/g Respiratory: clear no added sound Able to move LE's  Discharge Instructions   Discharge Instructions    Diet - low sodium heart healthy   Complete by:  As directed    Increase activity slowly   Complete by:  As directed      Allergies as of 03/19/2017      Reactions   Latex    Savaysa [edoxaban] Other (See Comments)   Caused Hgb to drop- adverse reaction, not allergy      Medication List    STOP taking these medications   docusate sodium 100 MG capsule Commonly known as:  COLACE     TAKE these medications   aspirin EC 81 MG tablet Take 81 mg by mouth daily.   Dutasteride-Tamsulosin HCl 0.5-0.4 MG Caps Take 1 capsule by mouth daily.   ENSURE Take 237 mLs by mouth daily.   furosemide 40 MG tablet Commonly known as:  LASIX Take 1 tablet (40 mg total) by mouth  daily.   iron polysaccharides 150 MG capsule Commonly known as:  NIFEREX Take 150 mg by mouth 2 (two) times daily.   levothyroxine 50 MCG tablet Commonly known as:  SYNTHROID, LEVOTHROID Take 50 mcg by mouth daily before breakfast.   MULTIVITAMIN ADULTS 50+ Tabs Take 1 tablet by mouth daily.   pantoprazole 40 MG tablet Commonly known as:  PROTONIX Take 1 tablet (40 mg total) by mouth daily.   potassium chloride  10 MEQ tablet Commonly known as:  K-DUR Take 1 tablet (10 mEq total) by mouth daily.   traMADol 50 MG tablet Commonly known as:  ULTRAM Take 1 tablet (50 mg total) by mouth every 6 (six) hours as needed for moderate pain.   Vitamin D-3 1000 units Caps Take 1,000 Units by mouth daily.      Allergies  Allergen Reactions  . Latex   . Savaysa [Edoxaban] Other (See Comments)    Caused Hgb to drop- adverse reaction, not allergy      The results of significant diagnostics from this hospitalization (including imaging, microbiology, ancillary and laboratory) are listed below for reference.    Significant Diagnostic Studies: Dg Chest 1 View  Result Date: 03/18/2017 CLINICAL DATA:  84 year old male with fall. EXAM: CHEST 1 VIEW COMPARISON:  Chest radiograph dated 12/13/2016 FINDINGS: Right lung base atelectatic changes. A small right pleural effusion may be present. Overall the aeration of the lungs is significantly improved compared to prior radiograph. The left lung is clear. There is no pneumothorax. There is mild cardiomegaly. Atherosclerotic calcification of the aortic arch and thoracic aorta. Osteopenia. No acute osseous pathology. IMPRESSION: Right lung base atelectatic changes and possible small right pleural effusion. Overall significant improvement in aeration of the lungs compared to the prior radiograph. Electronically Signed   By: Anner Crete M.D.   On: 03/18/2017 00:06   Dg Pelvis 1-2 Views  Result Date: 03/18/2017 CLINICAL DATA:  Fall.  Right hip pain. EXAM: PELVIS - 1-2 VIEW COMPARISON:  09/11/2015 CT abdomen/pelvis. FINDINGS: There is a suggestion of slight cortical irregularity in right pubic bone, cannot exclude a nondisplaced fracture in this location. No additional potential pelvic fracture. No pelvic diastasis. No evidence of hip dislocation on this single frontal view. No suspicious focal osseous lesions. Degenerative changes in the visualized lower lumbar spine.  Bilateral hip osteoarthritis. IMPRESSION: Suggestion of slight cortical irregularity in the right pubic bone, cannot exclude a nondisplaced fracture in this location. No additional potential pelvic fracture. No pelvic diastasis. No evidence of hip dislocation on this single frontal view. Electronically Signed   By: Ilona Sorrel M.D.   On: 03/18/2017 00:09   Dg Knee 2 Views Right  Result Date: 03/18/2017 CLINICAL DATA:  Fall.  Right knee pain. EXAM: RIGHT KNEE - 1-2 VIEW COMPARISON:  None. FINDINGS: No right knee fracture, joint effusion or dislocation. Meniscal chondrocalcinosis. Mild medial and lateral compartment osteoarthritis in the right knee joint. No suspicious focal osseous lesions. Vascular calcifications throughout the posterior soft tissues. No radiopaque foreign body. IMPRESSION: 1. No right knee fracture, joint effusion or dislocation. 2. Mild medial and lateral compartment right knee osteoarthritis, probably due to CPPD arthropathy given the meniscal chondrocalcinosis. Electronically Signed   By: Ilona Sorrel M.D.   On: 03/18/2017 00:05   Ct Head Wo Contrast  Result Date: 03/18/2017 CLINICAL DATA:  Unwitnessed fall.  Alzheimer disease. EXAM: CT HEAD WITHOUT CONTRAST CT CERVICAL SPINE WITHOUT CONTRAST TECHNIQUE: Multidetector CT imaging of the head and cervical spine was performed following  the standard protocol without intravenous contrast. Multiplanar CT image reconstructions of the cervical spine were also generated. COMPARISON:  05/09/2008 head CT. FINDINGS: CT HEAD FINDINGS Brain: No evidence of parenchymal hemorrhage or extra-axial fluid collection. No mass lesion, mass effect, or midline shift. No CT evidence of acute infarction. Generalized cerebral volume loss. Focal encephalomalacia in the right parietal lobe and left cerebellar hemisphere. Nonspecific mild to moderate subcortical and periventricular white matter hypodensity, most in keeping with chronic small vessel ischemic change.  Cerebral ventricle sizes are concordant with the degree of cerebral volume loss. Vascular: No acute abnormality. Skull: No evidence of calvarial fracture. Sinuses/Orbits: The visualized paranasal sinuses are essentially clear. Other:  The mastoid air cells are unopacified. CT CERVICAL SPINE FINDINGS Alignment: Normal cervical lordosis. No facet subluxation. Dens is well positioned between the lateral masses of C1. There is 2 mm retrolisthesis at C5-6. There is 3 mm anterolisthesis at C7-T1. Skull base and vertebrae: No acute fracture. No primary bone lesion or focal pathologic process. Soft tissues and spinal canal: No prevertebral edema. No visible canal hematoma. Disc levels: Severe multilevel degenerative disc disease throughout the cervical spine. Severe bilateral facet arthropathy. Moderate degenerative foraminal stenosis on the left at C3-4. Mild degenerative foraminal stenosis bilaterally at C4-5. Moderate degenerative foraminal stenosis on the right at C5-6. Upper chest: No acute abnormality. Other: Visualized mastoid air cells appear clear. No discrete thyroid nodules. No pathologically enlarged cervical nodes. IMPRESSION: CT HEAD: 1. No evidence of acute intracranial abnormality. No evidence of calvarial fracture. 2. Generalized cerebral volume loss and chronic small vessel ischemic changes in the cerebral white matter. 3. Encephalomalacia in the right parietal lobe and left cerebellar hemisphere. CT CERVICAL SPINE: 1. No cervical spine fracture or facet subluxation. 2. Severe multilevel degenerative changes throughout the cervical spine as detailed. 3. Mild multilevel cervical spine spondylolisthesis is probably degenerative. Electronically Signed   By: Ilona Sorrel M.D.   On: 03/18/2017 00:02   Ct Cervical Spine Wo Contrast  Result Date: 03/18/2017 CLINICAL DATA:  Unwitnessed fall.  Alzheimer disease. EXAM: CT HEAD WITHOUT CONTRAST CT CERVICAL SPINE WITHOUT CONTRAST TECHNIQUE: Multidetector CT  imaging of the head and cervical spine was performed following the standard protocol without intravenous contrast. Multiplanar CT image reconstructions of the cervical spine were also generated. COMPARISON:  05/09/2008 head CT. FINDINGS: CT HEAD FINDINGS Brain: No evidence of parenchymal hemorrhage or extra-axial fluid collection. No mass lesion, mass effect, or midline shift. No CT evidence of acute infarction. Generalized cerebral volume loss. Focal encephalomalacia in the right parietal lobe and left cerebellar hemisphere. Nonspecific mild to moderate subcortical and periventricular white matter hypodensity, most in keeping with chronic small vessel ischemic change. Cerebral ventricle sizes are concordant with the degree of cerebral volume loss. Vascular: No acute abnormality. Skull: No evidence of calvarial fracture. Sinuses/Orbits: The visualized paranasal sinuses are essentially clear. Other:  The mastoid air cells are unopacified. CT CERVICAL SPINE FINDINGS Alignment: Normal cervical lordosis. No facet subluxation. Dens is well positioned between the lateral masses of C1. There is 2 mm retrolisthesis at C5-6. There is 3 mm anterolisthesis at C7-T1. Skull base and vertebrae: No acute fracture. No primary bone lesion or focal pathologic process. Soft tissues and spinal canal: No prevertebral edema. No visible canal hematoma. Disc levels: Severe multilevel degenerative disc disease throughout the cervical spine. Severe bilateral facet arthropathy. Moderate degenerative foraminal stenosis on the left at C3-4. Mild degenerative foraminal stenosis bilaterally at C4-5. Moderate degenerative foraminal stenosis on the right at C5-6.  Upper chest: No acute abnormality. Other: Visualized mastoid air cells appear clear. No discrete thyroid nodules. No pathologically enlarged cervical nodes. IMPRESSION: CT HEAD: 1. No evidence of acute intracranial abnormality. No evidence of calvarial fracture. 2. Generalized cerebral  volume loss and chronic small vessel ischemic changes in the cerebral white matter. 3. Encephalomalacia in the right parietal lobe and left cerebellar hemisphere. CT CERVICAL SPINE: 1. No cervical spine fracture or facet subluxation. 2. Severe multilevel degenerative changes throughout the cervical spine as detailed. 3. Mild multilevel cervical spine spondylolisthesis is probably degenerative. Electronically Signed   By: Ilona Sorrel M.D.   On: 03/18/2017 00:02   Ct Hip Right Wo Contrast  Result Date: 03/18/2017 CLINICAL DATA:  84 year old male with fall and right lower extremity pain. EXAM: CT OF THE RIGHT HIP WITHOUT CONTRAST TECHNIQUE: Multidetector CT imaging of the right hip was performed according to the standard protocol. Multiplanar CT image reconstructions were also generated. COMPARISON:  Radiograph dated 03/17/2017 and CT of the abdomen pelvis dated 09/11/2015 FINDINGS: Bones/Joint/Cartilage Evaluation is limited due to advanced osteopenia. There is no displaced fracture. Focal cortical discontinuity of the greater trochanter of the femur (coronal series 8, image 61 and sagittal series 9, image 19) may be chronic. A nondisplaced cortical fracture is not entirely excluded. There is also discontinuity of the inferior right sacral alum (coronal series 8 image 60 and 61) which may represent chronic changes and osteopenia although a fracture is not entirely excluded. There irregularity of the right superior and inferior pubic rami suspicious for acute nondisplaced fractures of the right superior and inferior pubic rami (series 9, image 73). There is moderate osteoarthritic changes of the right hip. No joint effusion. Ligaments Suboptimally assessed by CT. Muscles and Tendons No intramuscular hematoma. Soft tissues Trabecular appearance of the bladder wall likely related to chronic bladder outlet obstruction. Partially visualized enlarged prostate gland. There is advanced atherosclerotic calcification of  the visualized vasculature. IMPRESSION: 1. Limited exam due to advanced osteopenia. No displaced femoral fracture. No dislocation. Linear lucency in the cortex of the greater trochanter of the femur may be chronic and less likely nondisplaced cortical fracture. 2. Findings suspicious for nondisplaced fractures of the right superior and inferior pubic rami. 3. Vertical lucency in the lateral right sacral alum with discontinuity of the inferior cortex of the right sacral alum may be chronic or related osteopenia. A nondisplaced fracture is not entirely excluded. Clinical correlation is recommended. Electronically Signed   By: Anner Crete M.D.   On: 03/18/2017 01:25   Mr Hip Right Wo Contrast  Result Date: 03/18/2017 CLINICAL DATA:  Unwitnessed fall, right hip pain. EXAM: MR OF THE RIGHT HIP WITHOUT CONTRAST TECHNIQUE: Multiplanar, multisequence MR imaging was performed. No intravenous contrast was administered. COMPARISON:  CT from 03/18/2017 FINDINGS: Bones: Nondisplaced fracture the right superior pubic ramus extending into the pubic body and adjacent junction of the pubic body and right inferior pubic ramus. Low-level abnormal edema extending vertically in the right sacral ala noted on the coronal images, favoring fracture. Today's exam was focused on the right hip rather than the sacrum. Right-sided intertrochanteric fracture is present and nondisplaced. There is a transverse component in the greater trochanter, with the portion extending towards the lesser trochanter much less conspicuous but still thought to be present as on images 3 through 8 of series 5 and for example on image 17 of series 8. Articular cartilage and labrum Articular cartilage:  Severe degenerative chondral thinning Labrum: Abnormal linear increased signal along the  deep margin of the right anterior superior labrum on images 16-19 of series 9 compatible with labral tear, likely chronic. Joint or bursal effusion Joint effusion:  Trace  bilateral hip joint effusions. Bursae: No distinct bursitis, although fluid signal does track deep to the right iliotibial band. Muscles and tendons Muscles and tendons: Considerable edema around the right hip including the gluteus minimus and medius muscles, the right hip adductor musculature, and vastus lateralis muscle. Adjacent subcutaneous edema is also present. Other findings Miscellaneous: Indirect left inguinal hernia contains a loop of bowel. Enlarged prostate gland measures 5.4 by 4.9 by 5.4 cm (volume = 75 cm^3). Lower lumbar spondylosis and degenerative disc disease. IMPRESSION: 1. Nondisplaced right intertrochanteric fracture. 2. Nondisplaced fractures through the right superior pubic ramus, pubic body, and adjacent inferior pubic ramus. 3. Nondisplaced fracture of the right sacral ala. 4. Extensive muscular edema in the right gluteal musculature, right hip adductor musculature, and right vastus lateralis with fluid signal tracking along the iliotibial band on the right. 5. Severe degenerative right hip arthropathy. 6. Chronic appearing right labral tear. 7. Trace bilateral hip joint effusions. 8. Indirect left inguinal hernia contains a loop of small bowel without findings of strangulation/obstruction. 9. Enlarged prostate gland. 10. Lower lumbar spondylosis and degenerative disc disease. Electronically Signed   By: Van Clines M.D.   On: 03/18/2017 08:18   Dg Femur Min 2 Views Right  Result Date: 03/18/2017 CLINICAL DATA:  Fall.  Right hip pain. EXAM: RIGHT FEMUR 2 VIEWS COMPARISON:  None. FINDINGS: No fracture. No suspicious focal osseous lesion. No dislocation at the right hip or right knee. Moderate osteoarthritis in the right hip joint. Moderate medial and lateral compartment right knee osteoarthritis. Extensive vascular calcifications throughout the soft tissues. No radiopaque foreign body. IMPRESSION: No fracture in the right femur. Electronically Signed   By: Ilona Sorrel M.D.   On:  03/18/2017 00:04    Microbiology: No results found for this or any previous visit (from the past 240 hour(s)).   Labs: Basic Metabolic Panel: Recent Labs  Lab 03/17/17 2257 03/18/17 0323 03/19/17 0523  NA 137 137 138  K 4.5 4.4 4.0  CL 100* 100* 102  CO2 26 26 24   GLUCOSE 153* 137* 95  BUN 36* 36* 39*  CREATININE 2.21* 2.18* 2.26*  CALCIUM 8.8* 8.6* 8.7*   Liver Function Tests: Recent Labs  Lab 03/17/17 2257 03/19/17 0523  AST 29 18  ALT 20 16*  ALKPHOS 68 57  BILITOT 1.1 1.1  PROT 5.6* 5.1*  ALBUMIN 3.0* 2.6*   No results for input(s): LIPASE, AMYLASE in the last 168 hours. No results for input(s): AMMONIA in the last 168 hours. CBC: Recent Labs  Lab 03/17/17 2257 03/18/17 0323 03/19/17 0523  WBC 10.7* 10.0 7.3  NEUTROABS 8.9*  --  5.4  HGB 12.7* 12.1* 11.7*  HCT 38.4* 36.2* 34.6*  MCV 85.5 85.2 85.6  PLT 195 178 162   Cardiac Enzymes: Recent Labs  Lab 03/18/17 0947 03/18/17 1308  TROPONINI 0.04* 0.04*   BNP: BNP (last 3 results) Recent Labs    12/13/16 1635  BNP 3,017.3*    ProBNP (last 3 results) No results for input(s): PROBNP in the last 8760 hours.  CBG: No results for input(s): GLUCAP in the last 168 hours.     Signed:  Nita Sells MD   [facility] 03/19/2017, 8:37 AM

## 2017-03-19 NOTE — NC FL2 (Signed)
[place] LEVEL OF CARE SCREENING TOOL     IDENTIFICATION  Patient Name: Justin Chambers Birthdate: [birth year] Sex: male Admission Date (Current Location): 03/17/2017  [facility] and [place] Number:  Herbalist and Address:  [facility] [place]. [facility], [place] [address], [place], [place] [address]      Provider Number: [id number]  Attending Physician Name and Address:  Nita Sells, MD  Relative Name and Phone Number:  Nelly Laurence, daughter, [phone]    Current Level of Care: Hospital Recommended Level of Care: [place] Prior Approval Number:    Date Approved/Denied:   PASRR Number: [id number]  Discharge Plan: Other (Comment)(Assisted Living)    Current Diagnoses: Patient Active Problem List   Diagnosis Date Noted  . Fall 03/18/2017  . Closed nondisplaced fracture of pelvis ([place])   . Severe protein-calorie malnutrition ([place])   . CHF (congestive heart failure) ([place]) 12/13/2016  . Fluid overload 12/13/2016  . Acute respiratory failure with hypoxia ([place]) 12/13/2016  . Hematuria, gross   . Sepsis ([place]) 09/09/2015  . Hematuria 09/09/2015  . Hypotension 09/09/2015  . Aortic stenosis 07/29/2015  . Hypothyroidism 07/29/2015  . Anemia due to chronic blood loss 07/28/2015  . DOE (dyspnea on exertion) 07/28/2015  . Blood loss anemia 07/28/2015  . Sick sinus syndrome ([place]) 04/24/2014  . Microcytic anemia 02/17/2014  . Chronic anticoagulation 02/17/2014  . Acute gangrenous cholecystitis 05/18/2013  . Cholecystitis 04/24/2013  . Chronic kidney disease (CKD) stage G3a/A3, moderately decreased glomerular filtration rate (GFR) between 45-59 mL/min/1.73 square meter and albuminuria creatinine ratio greater than 300 mg/g (HCC) 04/24/2013    Class: Chronic  . Atrial fibrillation ([place]) 04/24/2013  . Alzheimer disease 04/24/2013    Orientation RESPIRATION BLADDER Height & Weight     Self, Place, Situation  Normal  Incontinent Weight: 123 lb 14.4 oz (56.2 kg) Height:  5\' 9"  (175.3 cm)  BEHAVIORAL SYMPTOMS/MOOD NEUROLOGICAL BOWEL NUTRITION STATUS      Continent Diet(See DC Summary)  AMBULATORY STATUS COMMUNICATION OF NEEDS Skin   Total Care Verbally Bruising(Non-displaced Femoral Fracture)                       Personal Care Assistance Level of Assistance  Bathing, Feeding, Dressing Bathing Assistance: Maximum assistance Feeding assistance: Maximum assistance Dressing Assistance: Maximum assistance     Functional Limitations Info  Sight, Hearing, Speech Sight Info: Adequate Hearing Info: Adequate Speech Info: Adequate    SPECIAL CARE FACTORS FREQUENCY                       Contractures      Additional Factors Info  Code Status, Allergies Code Status Info: DNR Allergies Info: LATEX, SAVAYSA EDOXABAN            Current Medications (03/19/2017):  This is the current hospital active medication list Current Facility-Administered Medications  Medication Dose Route Frequency Provider Last Rate Last Dose  . acetaminophen (TYLENOL) tablet 650 mg  650 mg Oral Q6H PRN Rise Patience, MD   650 mg at 03/19/17 1335   Or  . acetaminophen (TYLENOL) suppository 650 mg  650 mg Rectal Q6H PRN Rise Patience, MD      . aspirin EC tablet 81 mg  81 mg Oral Daily Rise Patience, MD   81 mg at 03/19/17 1048  . cholecalciferol (VITAMIN D) tablet 1,000 Units  1,000 Units Oral Daily Rise Patience, MD  1,000 Units at 03/19/17 1049  . docusate sodium (COLACE) capsule 100 mg  100 mg Oral BID Rise Patience, MD   100 mg at 03/19/17 1049  . dutasteride (AVODART) capsule 0.5 mg  0.5 mg Oral Daily Nita Sells, MD   0.5 mg at 03/19/17 1049   And  . tamsulosin (FLOMAX) capsule 0.4 mg  0.4 mg Oral Daily Nita Sells, MD   0.4 mg at 03/19/17 1049  . feeding supplement (ENSURE ENLIVE) (ENSURE ENLIVE) liquid 237 mL  237 mL Oral Daily Rise Patience, MD    237 mL at 03/19/17 1049  . furosemide (LASIX) tablet 40 mg  40 mg Oral Daily Rise Patience, MD   40 mg at 03/19/17 1049  . iron polysaccharides (NIFEREX) capsule 150 mg  150 mg Oral BID Rise Patience, MD   150 mg at 03/19/17 1049  . levothyroxine (SYNTHROID, LEVOTHROID) tablet 50 mcg  50 mcg Oral QAC breakfast Rise Patience, MD   50 mcg at 03/19/17 0816  . morphine 4 MG/ML injection 0.52 mg  0.52 mg Intravenous Q4H PRN Rise Patience, MD      . multivitamin with minerals tablet   Oral Daily Rise Patience, MD   1 tablet at 03/19/17 1048  . ondansetron (ZOFRAN) tablet 4 mg  4 mg Oral Q6H PRN Rise Patience, MD       Or  . ondansetron [facility]) injection 4 mg  4 mg Intravenous Q6H PRN Rise Patience, MD      . pantoprazole (PROTONIX) EC tablet 40 mg  40 mg Oral Daily Rise Patience, MD   40 mg at 03/19/17 1049  . potassium chloride (K-DUR) CR tablet 10 mEq  10 mEq Oral Daily Rise Patience, MD   10 mEq at 03/19/17 1049     Discharge Medications: Please see discharge summary for a list of discharge medications.  Relevant Imaging Results:  Relevant Lab Results:   Additional Information SS#: [place], LCSW

## 2017-03-19 NOTE — Progress Notes (Signed)
No charge note.  I spoke with daughter Doralee Albino on the phone.  The patient is already connected with outpatient Palliative Care services (I'm not certain which group).  She wants to continue those services.  She also would like to speak with Case Mgmt / Social work to ensure that the level of care he is being discharged to is appropriate.    Defer inpatient consult for now.   Please call us back if we can be of assistance.  Florentina Jenny, PA-C Palliative Medicine Pager: [phone]

## 2017-03-19 NOTE — Clinical Social Work Note (Signed)
Clinical Social Work Assessment  Patient Details  Name: Justin Chambers MRN: [id number] Date of Birth: April 07, 1923  Date of referral:  03/19/17               Reason for consult:  Facility Placement                Permission sought to share information with:  Chartered certified accountant granted to share information::  Yes, Verbal Permission Granted  Name::     Justin Chambers  Agency::  SNF  Relationship::  daughter  Contact Information:     Housing/Transportation Living arrangements for the past 2 months:  [place] of Information:  Facility, Adult Children Patient Interpreter Needed:  None Criminal Activity/Legal Involvement Pertinent to Current Situation/Hospitalization:  No - Comment as needed Significant Relationships:  Adult Children, Other Family Members Lives with:  Facility Resident Do you feel safe going back to the place where you live?  Yes Need for family participation in patient care:  Yes (Comment)  Care giving concerns:  CSW met with patient at bedside to get some information about the facility he came from and he indicated he came from [place]. Pt appears a little confused, so CSW will f/u with daughter.  CSW spoke with daughter Justin Chambers and she confirmed that patient has been at [facility] [place] since August and was living in independent living prior to taking residence at [facility]. Daughter indicated CSW can contact [place] at the [facility] for assistance.  CSW advised daughter that doctor put in discharge and CSW would set up transport for patient return to facility. Daughter in agreement.  Social Worker assessment / plan:  CSW will complete FL2 and contact facility to assist with discharge.  Employment status:  Retired Forensic scientist:  Medicare PT Recommendations:  [place] / Referral to community resources:  [place]  Patient/Family's Response to care:  Family  appreciative of CSW assistance with the discharge needs and reported no issues with care.  Patient/Family's Understanding of and Emotional Response to Diagnosis, Current Treatment, and Prognosis:  Family has good understanding of patient's condition as the daughter indicated and agreed with the patient being non-weight bearing for a few weeks and wanted to confirm that the discharge summary indicated same. CSW confirmed and validated recommendations. Daughter in agreement with patient returning to facility. No other issues or concerns identified.  Emotional Assessment Appearance:  Appears stated age Attitude/Demeanor/Rapport:  (Confused) Affect (typically observed):  Accepting, Appropriate Orientation:  Oriented to Self, Oriented to Place, Oriented to Situation Alcohol / Substance use:  Not Applicable Psych involvement (Current and /or in the community):  No (Comment)  Discharge Needs  Concerns to be addressed:  Discharge Planning Concerns Readmission within the last 30 days:  No Current discharge risk:  Dependent with Mobility, Physical Impairment Barriers to Discharge:  No Barriers Identified   [name], LCSW 03/19/2017, 11:42 AM

## 2017-03-19 NOTE — Progress Notes (Signed)
CCM notified that Tele is off and IV removed from pt, intact

## 2017-03-19 NOTE — NC FL2 (Signed)
[place] LEVEL OF CARE SCREENING TOOL     IDENTIFICATION  Patient Name: Justin Chambers Birthdate: 1923-04-13 Sex: male Admission Date (Current Location): 03/17/2017  [facility] and [place] Number:  Herbalist and Address:  [facility] [place]. [facility], [place] [address] [place], [place] [address]      Provider Number: [id number]  Attending Physician Name and Address:  Nita Sells, MD  Relative Name and Phone Number:  Nelly Laurence, daughter, [phone]    Current Level of Care: Hospital Recommended Level of Care: [place] Prior Approval Number:    Date Approved/Denied:   PASRR Number: [id number]  Discharge Plan: SNF    Current Diagnoses: Patient Active Problem List   Diagnosis Date Noted  . Fall 03/18/2017  . Closed nondisplaced fracture of pelvis ([place])   . Severe protein-calorie malnutrition ([place])   . CHF (congestive heart failure) ([place]) 12/13/2016  . Fluid overload 12/13/2016  . Acute respiratory failure with hypoxia ([place]) 12/13/2016  . Hematuria, gross   . Sepsis ([place]) 09/09/2015  . Hematuria 09/09/2015  . Hypotension 09/09/2015  . Aortic stenosis 07/29/2015  . Hypothyroidism 07/29/2015  . Anemia due to chronic blood loss 07/28/2015  . DOE (dyspnea on exertion) 07/28/2015  . Blood loss anemia 07/28/2015  . Sick sinus syndrome ([place]) 04/24/2014  . Microcytic anemia 02/17/2014  . Chronic anticoagulation 02/17/2014  . Acute gangrenous cholecystitis 05/18/2013  . Cholecystitis 04/24/2013  . Chronic kidney disease (CKD) stage G3a/A3, moderately decreased glomerular filtration rate (GFR) between 45-59 mL/min/1.73 square meter and albuminuria creatinine ratio greater than 300 mg/g (HCC) 04/24/2013    Class: Chronic  . Atrial fibrillation ([place]) 04/24/2013  . Alzheimer disease 04/24/2013    Orientation RESPIRATION BLADDER Height & Weight     Self, Place, Situation  Normal Incontinent Weight: 123 lb 14.4  oz (56.2 kg) Height:  5\' 9"  (175.3 cm)  BEHAVIORAL SYMPTOMS/MOOD NEUROLOGICAL BOWEL NUTRITION STATUS      Continent Diet(See DC Summary)  AMBULATORY STATUS COMMUNICATION OF NEEDS Skin   Total Care Verbally Bruising(Non-displaced Femoral Fracture)                       Personal Care Assistance Level of Assistance  Bathing, Feeding, Dressing Bathing Assistance: Maximum assistance Feeding assistance: Maximum assistance Dressing Assistance: Maximum assistance     Functional Limitations Info  Sight, Hearing, Speech Sight Info: Adequate Hearing Info: Adequate Speech Info: Adequate    SPECIAL CARE FACTORS FREQUENCY                       Contractures      Additional Factors Info  Code Status, Allergies Code Status Info: DNR Allergies Info: LATEX, SAVAYSA EDOXABAN            Current Medications (03/19/2017):  This is the current hospital active medication list Current Facility-Administered Medications  Medication Dose Route Frequency Provider Last Rate Last Dose  . acetaminophen (TYLENOL) tablet 650 mg  650 mg Oral Q6H PRN Rise Patience, MD   650 mg at 03/18/17 2158   Or  . acetaminophen (TYLENOL) suppository 650 mg  650 mg Rectal Q6H PRN Rise Patience, MD      . aspirin EC tablet 81 mg  81 mg Oral Daily Rise Patience, MD   81 mg at 03/19/17 1048  . cholecalciferol (VITAMIN D) tablet 1,000 Units  1,000 Units Oral Daily Rise Patience, MD   1,000  Units at 03/19/17 1049  . docusate sodium (COLACE) capsule 100 mg  100 mg Oral BID Rise Patience, MD   100 mg at 03/19/17 1049  . dutasteride (AVODART) capsule 0.5 mg  0.5 mg Oral Daily Nita Sells, MD   0.5 mg at 03/19/17 1049   And  . tamsulosin (FLOMAX) capsule 0.4 mg  0.4 mg Oral Daily Nita Sells, MD   0.4 mg at 03/19/17 1049  . feeding supplement (ENSURE ENLIVE) (ENSURE ENLIVE) liquid 237 mL  237 mL Oral Daily Rise Patience, MD   237 mL at 03/19/17 1049  .  furosemide (LASIX) tablet 40 mg  40 mg Oral Daily Rise Patience, MD   40 mg at 03/19/17 1049  . iron polysaccharides (NIFEREX) capsule 150 mg  150 mg Oral BID Rise Patience, MD   150 mg at 03/19/17 1049  . levothyroxine (SYNTHROID, LEVOTHROID) tablet 50 mcg  50 mcg Oral QAC breakfast Rise Patience, MD   50 mcg at 03/19/17 0816  . morphine 4 MG/ML injection 0.52 mg  0.52 mg Intravenous Q4H PRN Rise Patience, MD      . multivitamin with minerals tablet   Oral Daily Rise Patience, MD   1 tablet at 03/19/17 1048  . ondansetron (ZOFRAN) tablet 4 mg  4 mg Oral Q6H PRN Rise Patience, MD       Or  . ondansetron [facility]) injection 4 mg  4 mg Intravenous Q6H PRN Rise Patience, MD      . pantoprazole (PROTONIX) EC tablet 40 mg  40 mg Oral Daily Rise Patience, MD   40 mg at 03/19/17 1049  . potassium chloride (K-DUR) CR tablet 10 mEq  10 mEq Oral Daily Rise Patience, MD   10 mEq at 03/19/17 1049     Discharge Medications: Please see discharge summary for a list of discharge medications.  Relevant Imaging Results:  Relevant Lab Results:   Additional Information SS#: [place], LCSW

## 2017-03-19 NOTE — Clinical Social Work Placement (Signed)
   CLINICAL SOCIAL WORK PLACEMENT  NOTE  Date:  03/19/2017  Patient Details  Name: Justin Chambers MRN: [id number] Date of Birth: 10/25/1923  Clinical Social Work is seeking post-discharge placement for this patient at the [place] level of care (*CSW will initial, date and re-position this form in  chart as items are completed):  Yes   Patient/family provided with [place] Work Department's list of facilities offering this level of care within the geographic area requested by the patient (or if unable, by the patient's family).  Yes   Patient/family informed of their freedom to choose among providers that offer the needed level of care, that participate in Medicare, Medicaid or managed care program needed by the patient, have an available bed and are willing to accept the patient.  Yes   Patient/family informed of [facility]'s ownership interest in [facility] and [facility], as well as of the fact that they are under no obligation to receive care at these facilities.  PASRR submitted to EDS on       PASRR number received on       Existing PASRR number confirmed on 03/12/17     FL2 transmitted to all facilities in geographic area requested by pt/family on       FL2 transmitted to all facilities within larger geographic area on 03/19/17     Patient informed that his/her managed care company has contracts with or will negotiate with certain facilities, including the following:        Yes   Patient/family informed of bed offers received.  Patient chooses bed at [facility]     Physician recommends and patient chooses bed at      Patient to be transferred to [facility] on 03/19/17.  Patient to be transferred to facility by PTAR     Patient family notified on 03/19/17 of transfer.  Name of family member notified:  daughter notified     PHYSICIAN       Additional Comment:     _______________________________________________ [name], LCSW 03/19/2017, 2:18 PM

## 2017-03-19 NOTE — Social Work (Signed)
CSW contacted Charity at [facility] to advise of patient's discharge. CSW sent clinicals to nursing facility.  CSW will f/u on disposition.  Elissa Hefty, LCSW Clinical Social Worker [phone]

## 2017-03-19 NOTE — Progress Notes (Signed)
Report given to Nisha at [facility]. All questions/concerns addressed. IV removed, catheter tip intact. Awaiting [facility] transport to ALF for discharge.

## 2017-03-26 DIAGNOSIS — I350 Nonrheumatic aortic (valve) stenosis: Secondary | ICD-10-CM | POA: Diagnosis not present

## 2017-03-26 DIAGNOSIS — I672 Cerebral atherosclerosis: Secondary | ICD-10-CM | POA: Diagnosis not present

## 2017-03-26 DIAGNOSIS — F0151 Vascular dementia with behavioral disturbance: Secondary | ICD-10-CM | POA: Diagnosis not present

## 2017-03-26 DIAGNOSIS — N401 Enlarged prostate with lower urinary tract symptoms: Secondary | ICD-10-CM | POA: Diagnosis not present

## 2017-03-26 DIAGNOSIS — R54 Age-related physical debility: Secondary | ICD-10-CM | POA: Diagnosis not present

## 2017-03-26 DIAGNOSIS — E46 Unspecified protein-calorie malnutrition: Secondary | ICD-10-CM | POA: Diagnosis not present

## 2017-03-26 DIAGNOSIS — E039 Hypothyroidism, unspecified: Secondary | ICD-10-CM | POA: Diagnosis not present

## 2017-03-26 DIAGNOSIS — N184 Chronic kidney disease, stage 4 (severe): Secondary | ICD-10-CM | POA: Diagnosis not present

## 2017-03-26 DIAGNOSIS — I4891 Unspecified atrial fibrillation: Secondary | ICD-10-CM | POA: Diagnosis not present

## 2017-03-26 DIAGNOSIS — K279 Peptic ulcer, site unspecified, unspecified as acute or chronic, without hemorrhage or perforation: Secondary | ICD-10-CM | POA: Diagnosis not present

## 2017-03-27 DIAGNOSIS — F0151 Vascular dementia with behavioral disturbance: Secondary | ICD-10-CM | POA: Diagnosis not present

## 2017-03-27 DIAGNOSIS — N184 Chronic kidney disease, stage 4 (severe): Secondary | ICD-10-CM | POA: Diagnosis not present

## 2017-03-27 DIAGNOSIS — E46 Unspecified protein-calorie malnutrition: Secondary | ICD-10-CM | POA: Diagnosis not present

## 2017-03-27 DIAGNOSIS — I4891 Unspecified atrial fibrillation: Secondary | ICD-10-CM | POA: Diagnosis not present

## 2017-03-27 DIAGNOSIS — I672 Cerebral atherosclerosis: Secondary | ICD-10-CM | POA: Diagnosis not present

## 2017-03-27 DIAGNOSIS — I350 Nonrheumatic aortic (valve) stenosis: Secondary | ICD-10-CM | POA: Diagnosis not present

## 2017-03-29 DIAGNOSIS — I350 Nonrheumatic aortic (valve) stenosis: Secondary | ICD-10-CM | POA: Diagnosis not present

## 2017-03-29 DIAGNOSIS — E46 Unspecified protein-calorie malnutrition: Secondary | ICD-10-CM | POA: Diagnosis not present

## 2017-03-29 DIAGNOSIS — N184 Chronic kidney disease, stage 4 (severe): Secondary | ICD-10-CM | POA: Diagnosis not present

## 2017-03-29 DIAGNOSIS — I672 Cerebral atherosclerosis: Secondary | ICD-10-CM | POA: Diagnosis not present

## 2017-03-29 DIAGNOSIS — I4891 Unspecified atrial fibrillation: Secondary | ICD-10-CM | POA: Diagnosis not present

## 2017-03-29 DIAGNOSIS — F0151 Vascular dementia with behavioral disturbance: Secondary | ICD-10-CM | POA: Diagnosis not present

## 2017-04-02 DIAGNOSIS — I4891 Unspecified atrial fibrillation: Secondary | ICD-10-CM | POA: Diagnosis not present

## 2017-04-02 DIAGNOSIS — N184 Chronic kidney disease, stage 4 (severe): Secondary | ICD-10-CM | POA: Diagnosis not present

## 2017-04-02 DIAGNOSIS — I350 Nonrheumatic aortic (valve) stenosis: Secondary | ICD-10-CM | POA: Diagnosis not present

## 2017-04-02 DIAGNOSIS — F0151 Vascular dementia with behavioral disturbance: Secondary | ICD-10-CM | POA: Diagnosis not present

## 2017-04-02 DIAGNOSIS — I672 Cerebral atherosclerosis: Secondary | ICD-10-CM | POA: Diagnosis not present

## 2017-04-02 DIAGNOSIS — E46 Unspecified protein-calorie malnutrition: Secondary | ICD-10-CM | POA: Diagnosis not present

## 2017-04-03 DIAGNOSIS — I4891 Unspecified atrial fibrillation: Secondary | ICD-10-CM | POA: Diagnosis not present

## 2017-04-03 DIAGNOSIS — F0151 Vascular dementia with behavioral disturbance: Secondary | ICD-10-CM | POA: Diagnosis not present

## 2017-04-03 DIAGNOSIS — E46 Unspecified protein-calorie malnutrition: Secondary | ICD-10-CM | POA: Diagnosis not present

## 2017-04-03 DIAGNOSIS — I672 Cerebral atherosclerosis: Secondary | ICD-10-CM | POA: Diagnosis not present

## 2017-04-03 DIAGNOSIS — I350 Nonrheumatic aortic (valve) stenosis: Secondary | ICD-10-CM | POA: Diagnosis not present

## 2017-04-03 DIAGNOSIS — N184 Chronic kidney disease, stage 4 (severe): Secondary | ICD-10-CM | POA: Diagnosis not present

## 2017-04-04 DIAGNOSIS — I350 Nonrheumatic aortic (valve) stenosis: Secondary | ICD-10-CM | POA: Diagnosis not present

## 2017-04-04 DIAGNOSIS — I672 Cerebral atherosclerosis: Secondary | ICD-10-CM | POA: Diagnosis not present

## 2017-04-04 DIAGNOSIS — I4891 Unspecified atrial fibrillation: Secondary | ICD-10-CM | POA: Diagnosis not present

## 2017-04-04 DIAGNOSIS — K279 Peptic ulcer, site unspecified, unspecified as acute or chronic, without hemorrhage or perforation: Secondary | ICD-10-CM | POA: Diagnosis not present

## 2017-04-04 DIAGNOSIS — R54 Age-related physical debility: Secondary | ICD-10-CM | POA: Diagnosis not present

## 2017-04-04 DIAGNOSIS — E46 Unspecified protein-calorie malnutrition: Secondary | ICD-10-CM | POA: Diagnosis not present

## 2017-04-04 DIAGNOSIS — N401 Enlarged prostate with lower urinary tract symptoms: Secondary | ICD-10-CM | POA: Diagnosis not present

## 2017-04-04 DIAGNOSIS — F0151 Vascular dementia with behavioral disturbance: Secondary | ICD-10-CM | POA: Diagnosis not present

## 2017-04-04 DIAGNOSIS — E039 Hypothyroidism, unspecified: Secondary | ICD-10-CM | POA: Diagnosis not present

## 2017-04-04 DIAGNOSIS — N184 Chronic kidney disease, stage 4 (severe): Secondary | ICD-10-CM | POA: Diagnosis not present

## 2017-04-07 DIAGNOSIS — I350 Nonrheumatic aortic (valve) stenosis: Secondary | ICD-10-CM | POA: Diagnosis not present

## 2017-04-07 DIAGNOSIS — F0151 Vascular dementia with behavioral disturbance: Secondary | ICD-10-CM | POA: Diagnosis not present

## 2017-04-07 DIAGNOSIS — I4891 Unspecified atrial fibrillation: Secondary | ICD-10-CM | POA: Diagnosis not present

## 2017-04-07 DIAGNOSIS — E46 Unspecified protein-calorie malnutrition: Secondary | ICD-10-CM | POA: Diagnosis not present

## 2017-04-07 DIAGNOSIS — N184 Chronic kidney disease, stage 4 (severe): Secondary | ICD-10-CM | POA: Diagnosis not present

## 2017-04-07 DIAGNOSIS — I672 Cerebral atherosclerosis: Secondary | ICD-10-CM | POA: Diagnosis not present

## 2017-04-08 DIAGNOSIS — E46 Unspecified protein-calorie malnutrition: Secondary | ICD-10-CM | POA: Diagnosis not present

## 2017-04-08 DIAGNOSIS — N184 Chronic kidney disease, stage 4 (severe): Secondary | ICD-10-CM | POA: Diagnosis not present

## 2017-04-08 DIAGNOSIS — F0151 Vascular dementia with behavioral disturbance: Secondary | ICD-10-CM | POA: Diagnosis not present

## 2017-04-08 DIAGNOSIS — I4891 Unspecified atrial fibrillation: Secondary | ICD-10-CM | POA: Diagnosis not present

## 2017-04-08 DIAGNOSIS — I672 Cerebral atherosclerosis: Secondary | ICD-10-CM | POA: Diagnosis not present

## 2017-04-08 DIAGNOSIS — I350 Nonrheumatic aortic (valve) stenosis: Secondary | ICD-10-CM | POA: Diagnosis not present

## 2017-04-10 DIAGNOSIS — I350 Nonrheumatic aortic (valve) stenosis: Secondary | ICD-10-CM | POA: Diagnosis not present

## 2017-04-10 DIAGNOSIS — I672 Cerebral atherosclerosis: Secondary | ICD-10-CM | POA: Diagnosis not present

## 2017-04-10 DIAGNOSIS — N184 Chronic kidney disease, stage 4 (severe): Secondary | ICD-10-CM | POA: Diagnosis not present

## 2017-04-10 DIAGNOSIS — I4891 Unspecified atrial fibrillation: Secondary | ICD-10-CM | POA: Diagnosis not present

## 2017-04-10 DIAGNOSIS — E46 Unspecified protein-calorie malnutrition: Secondary | ICD-10-CM | POA: Diagnosis not present

## 2017-04-10 DIAGNOSIS — F0151 Vascular dementia with behavioral disturbance: Secondary | ICD-10-CM | POA: Diagnosis not present

## 2017-04-11 DIAGNOSIS — I350 Nonrheumatic aortic (valve) stenosis: Secondary | ICD-10-CM | POA: Diagnosis not present

## 2017-04-11 DIAGNOSIS — I672 Cerebral atherosclerosis: Secondary | ICD-10-CM | POA: Diagnosis not present

## 2017-04-11 DIAGNOSIS — N184 Chronic kidney disease, stage 4 (severe): Secondary | ICD-10-CM | POA: Diagnosis not present

## 2017-04-11 DIAGNOSIS — E46 Unspecified protein-calorie malnutrition: Secondary | ICD-10-CM | POA: Diagnosis not present

## 2017-04-11 DIAGNOSIS — F0151 Vascular dementia with behavioral disturbance: Secondary | ICD-10-CM | POA: Diagnosis not present

## 2017-04-11 DIAGNOSIS — I4891 Unspecified atrial fibrillation: Secondary | ICD-10-CM | POA: Diagnosis not present

## 2017-04-14 DIAGNOSIS — I672 Cerebral atherosclerosis: Secondary | ICD-10-CM | POA: Diagnosis not present

## 2017-04-14 DIAGNOSIS — N184 Chronic kidney disease, stage 4 (severe): Secondary | ICD-10-CM | POA: Diagnosis not present

## 2017-04-14 DIAGNOSIS — I350 Nonrheumatic aortic (valve) stenosis: Secondary | ICD-10-CM | POA: Diagnosis not present

## 2017-04-14 DIAGNOSIS — I4891 Unspecified atrial fibrillation: Secondary | ICD-10-CM | POA: Diagnosis not present

## 2017-04-14 DIAGNOSIS — E46 Unspecified protein-calorie malnutrition: Secondary | ICD-10-CM | POA: Diagnosis not present

## 2017-04-14 DIAGNOSIS — F0151 Vascular dementia with behavioral disturbance: Secondary | ICD-10-CM | POA: Diagnosis not present

## 2017-04-15 DIAGNOSIS — I4891 Unspecified atrial fibrillation: Secondary | ICD-10-CM | POA: Diagnosis not present

## 2017-04-15 DIAGNOSIS — E46 Unspecified protein-calorie malnutrition: Secondary | ICD-10-CM | POA: Diagnosis not present

## 2017-04-15 DIAGNOSIS — F0151 Vascular dementia with behavioral disturbance: Secondary | ICD-10-CM | POA: Diagnosis not present

## 2017-04-15 DIAGNOSIS — N184 Chronic kidney disease, stage 4 (severe): Secondary | ICD-10-CM | POA: Diagnosis not present

## 2017-04-15 DIAGNOSIS — I350 Nonrheumatic aortic (valve) stenosis: Secondary | ICD-10-CM | POA: Diagnosis not present

## 2017-04-15 DIAGNOSIS — I672 Cerebral atherosclerosis: Secondary | ICD-10-CM | POA: Diagnosis not present

## 2017-04-16 DIAGNOSIS — I672 Cerebral atherosclerosis: Secondary | ICD-10-CM | POA: Diagnosis not present

## 2017-04-16 DIAGNOSIS — E46 Unspecified protein-calorie malnutrition: Secondary | ICD-10-CM | POA: Diagnosis not present

## 2017-04-16 DIAGNOSIS — I4891 Unspecified atrial fibrillation: Secondary | ICD-10-CM | POA: Diagnosis not present

## 2017-04-16 DIAGNOSIS — I350 Nonrheumatic aortic (valve) stenosis: Secondary | ICD-10-CM | POA: Diagnosis not present

## 2017-04-16 DIAGNOSIS — F0151 Vascular dementia with behavioral disturbance: Secondary | ICD-10-CM | POA: Diagnosis not present

## 2017-04-16 DIAGNOSIS — N184 Chronic kidney disease, stage 4 (severe): Secondary | ICD-10-CM | POA: Diagnosis not present

## 2017-04-17 DIAGNOSIS — F0151 Vascular dementia with behavioral disturbance: Secondary | ICD-10-CM | POA: Diagnosis not present

## 2017-04-17 DIAGNOSIS — I672 Cerebral atherosclerosis: Secondary | ICD-10-CM | POA: Diagnosis not present

## 2017-04-17 DIAGNOSIS — I4891 Unspecified atrial fibrillation: Secondary | ICD-10-CM | POA: Diagnosis not present

## 2017-04-17 DIAGNOSIS — E46 Unspecified protein-calorie malnutrition: Secondary | ICD-10-CM | POA: Diagnosis not present

## 2017-04-17 DIAGNOSIS — I350 Nonrheumatic aortic (valve) stenosis: Secondary | ICD-10-CM | POA: Diagnosis not present

## 2017-04-17 DIAGNOSIS — N184 Chronic kidney disease, stage 4 (severe): Secondary | ICD-10-CM | POA: Diagnosis not present

## 2017-04-21 DIAGNOSIS — I672 Cerebral atherosclerosis: Secondary | ICD-10-CM | POA: Diagnosis not present

## 2017-04-21 DIAGNOSIS — I350 Nonrheumatic aortic (valve) stenosis: Secondary | ICD-10-CM | POA: Diagnosis not present

## 2017-04-21 DIAGNOSIS — F0151 Vascular dementia with behavioral disturbance: Secondary | ICD-10-CM | POA: Diagnosis not present

## 2017-04-21 DIAGNOSIS — N184 Chronic kidney disease, stage 4 (severe): Secondary | ICD-10-CM | POA: Diagnosis not present

## 2017-04-21 DIAGNOSIS — E46 Unspecified protein-calorie malnutrition: Secondary | ICD-10-CM | POA: Diagnosis not present

## 2017-04-21 DIAGNOSIS — I4891 Unspecified atrial fibrillation: Secondary | ICD-10-CM | POA: Diagnosis not present

## 2017-04-22 DIAGNOSIS — I350 Nonrheumatic aortic (valve) stenosis: Secondary | ICD-10-CM | POA: Diagnosis not present

## 2017-04-22 DIAGNOSIS — I672 Cerebral atherosclerosis: Secondary | ICD-10-CM | POA: Diagnosis not present

## 2017-04-22 DIAGNOSIS — E46 Unspecified protein-calorie malnutrition: Secondary | ICD-10-CM | POA: Diagnosis not present

## 2017-04-22 DIAGNOSIS — I4891 Unspecified atrial fibrillation: Secondary | ICD-10-CM | POA: Diagnosis not present

## 2017-04-22 DIAGNOSIS — F0151 Vascular dementia with behavioral disturbance: Secondary | ICD-10-CM | POA: Diagnosis not present

## 2017-04-22 DIAGNOSIS — N184 Chronic kidney disease, stage 4 (severe): Secondary | ICD-10-CM | POA: Diagnosis not present

## 2017-04-23 ENCOUNTER — Encounter (INDEPENDENT_AMBULATORY_CARE_PROVIDER_SITE_OTHER): Payer: Self-pay | Admitting: Orthopedic Surgery

## 2017-04-23 ENCOUNTER — Ambulatory Visit (INDEPENDENT_AMBULATORY_CARE_PROVIDER_SITE_OTHER): Payer: Medicare Other

## 2017-04-23 ENCOUNTER — Ambulatory Visit (INDEPENDENT_AMBULATORY_CARE_PROVIDER_SITE_OTHER): Payer: Medicare Other | Admitting: Orthopedic Surgery

## 2017-04-23 DIAGNOSIS — Z09 Encounter for follow-up examination after completed treatment for conditions other than malignant neoplasm: Secondary | ICD-10-CM | POA: Diagnosis not present

## 2017-04-23 NOTE — Progress Notes (Signed)
Post-Op Visit Note   Patient: Justin Chambers           Date of Birth: 1923-07-02           MRN: [id number] Visit Date: 04/23/2017 PCP: Burnard Bunting, MD   Assessment & Plan:  Chief Complaint:  Chief Complaint  Patient presents with  . Right Hip - Pain   Visit Diagnoses:  1. Fracture follow-up     Plan: Balraj is a patient with right hip nondisplaced trochanteric fracture right pubic rami fracture and right sacral alar fracture sustained 03/17/2017 he has been at a skilled nursing facility.  He has been nonweightbearing.  On examination he only has minimal pain with internal and external rotation of that right hip.  Radiographs show minimal change in fracture alignment and some early healing.  At this 0.5 weeks out I am going to allow him to be weightbearing as tolerated with a walker.  Follow-up in 3 weeks for clinical recheck.  Follow-Up Instructions: Return in about 3 weeks (around 05/14/2017).   Orders:  Orders Placed This Encounter  Procedures  . XR HIP UNILAT W OR W/O PELVIS 2-3 VIEWS RIGHT   No orders of the defined types were placed in this encounter.   Imaging: Xr Hip Unilat W Or W/o Pelvis 2-3 Views Right  Result Date: 04/23/2017 AP pelvis lateral right hip reviewed.  Superior ramus fracture noted on the right with no significant displacement.  Linear trochanteric fracture is noted also with no displacement.  Sacral fracture on the right nonvisible.  No other bony pelvis abnormalities.   PMFS History: Patient Active Problem List   Diagnosis Date Noted  . Fall 03/18/2017  . Closed nondisplaced fracture of pelvis ([place])   . Severe protein-calorie malnutrition ([place])   . CHF (congestive heart failure) ([place]) 12/13/2016  . Fluid overload 12/13/2016  . Acute respiratory failure with hypoxia ([place]) 12/13/2016  . Hematuria, gross   . Sepsis ([place]) 09/09/2015  . Hematuria 09/09/2015  . Hypotension 09/09/2015  . Aortic stenosis 07/29/2015  . Hypothyroidism 07/29/2015    . Anemia due to chronic blood loss 07/28/2015  . DOE (dyspnea on exertion) 07/28/2015  . Blood loss anemia 07/28/2015  . Sick sinus syndrome ([place]) 04/24/2014  . Microcytic anemia 02/17/2014  . Chronic anticoagulation 02/17/2014  . Acute gangrenous cholecystitis 05/18/2013  . Cholecystitis 04/24/2013  . Chronic kidney disease (CKD) stage G3a/A3, moderately decreased glomerular filtration rate (GFR) between 45-59 mL/min/1.73 square meter and albuminuria creatinine ratio greater than 300 mg/g (HCC) 04/24/2013    Class: Chronic  . Atrial fibrillation ([place]) 04/24/2013  . Alzheimer disease 04/24/2013   Past Medical History:  Diagnosis Date  . Alzheimer disease   . Anemia due to chronic blood loss 07/2015  . Atrial fibrillation ([place])   . Cholecystitis   . Chronic kidney disease   . CKD (chronic kidney disease)   . Dementia   . DOE (dyspnea on exertion)   . Fracture    right leg(casted- high school), collar bone(child)  . Hypothyroidism   . Transfusion history    in past ? when    Family History  Problem Relation Age of Onset  . Diabetes Neg Hx   . CAD Neg Hx     Past Surgical History:  Procedure Laterality Date  . ABDOMINAL SURGERY     part of stomach removed  . CHOLECYSTECTOMY N/A 04/27/2013   Procedure: LAPAROSCOPIC CHOLECYSTECTOMY WITH INTRAOPERATIVE CHOLANGIOGRAM;  Surgeon: Imogene Burn. [name], MD;  Location: [facility];  Service: General;  Laterality: N/A;  . ESOPHAGOGASTRODUODENOSCOPY N/A 07/30/2015   Procedure: ESOPHAGOGASTRODUODENOSCOPY (EGD);  Surgeon: Ronald Lobo, MD;  Location: [facility] END[facility]COPY;  Service: Endoscopy;  Laterality: N/A;  Pediatric UPPER endoscope, please  . LAPAROSCOPIC LYSIS OF ADHESIONS N/A 04/27/2013   Procedure: LAPAROSCOPIC LYSIS OF ADHESIONS;  Surgeon: Imogene Burn. [name], MD;  Location: [facility] OR;  Service: General;  Laterality: N/A;  . PARTIAL GASTRECTOMY Bilateral   . TONSILLECTOMY     Social History   Occupational History  . Not on file  Tobacco Use  .  Smoking status: Never Smoker  . Smokeless tobacco: Never Used  Substance and Sexual Activity  . Alcohol use: No  . Drug use: No  . Sexual activity: No

## 2017-04-24 DIAGNOSIS — I4891 Unspecified atrial fibrillation: Secondary | ICD-10-CM | POA: Diagnosis not present

## 2017-04-24 DIAGNOSIS — I350 Nonrheumatic aortic (valve) stenosis: Secondary | ICD-10-CM | POA: Diagnosis not present

## 2017-04-24 DIAGNOSIS — F0151 Vascular dementia with behavioral disturbance: Secondary | ICD-10-CM | POA: Diagnosis not present

## 2017-04-24 DIAGNOSIS — N184 Chronic kidney disease, stage 4 (severe): Secondary | ICD-10-CM | POA: Diagnosis not present

## 2017-04-24 DIAGNOSIS — E46 Unspecified protein-calorie malnutrition: Secondary | ICD-10-CM | POA: Diagnosis not present

## 2017-04-24 DIAGNOSIS — I672 Cerebral atherosclerosis: Secondary | ICD-10-CM | POA: Diagnosis not present

## 2017-04-25 ENCOUNTER — Telehealth (INDEPENDENT_AMBULATORY_CARE_PROVIDER_SITE_OTHER): Payer: Self-pay | Admitting: Orthopedic Surgery

## 2017-04-25 DIAGNOSIS — I350 Nonrheumatic aortic (valve) stenosis: Secondary | ICD-10-CM | POA: Diagnosis not present

## 2017-04-25 DIAGNOSIS — I672 Cerebral atherosclerosis: Secondary | ICD-10-CM | POA: Diagnosis not present

## 2017-04-25 DIAGNOSIS — I4891 Unspecified atrial fibrillation: Secondary | ICD-10-CM | POA: Diagnosis not present

## 2017-04-25 DIAGNOSIS — N184 Chronic kidney disease, stage 4 (severe): Secondary | ICD-10-CM | POA: Diagnosis not present

## 2017-04-25 DIAGNOSIS — F0151 Vascular dementia with behavioral disturbance: Secondary | ICD-10-CM | POA: Diagnosis not present

## 2017-04-25 DIAGNOSIS — E46 Unspecified protein-calorie malnutrition: Secondary | ICD-10-CM | POA: Diagnosis not present

## 2017-04-25 NOTE — Telephone Encounter (Signed)
IC advised.  

## 2017-04-25 NOTE — Telephone Encounter (Signed)
Please advise. Thanks.  

## 2017-04-25 NOTE — Telephone Encounter (Signed)
As per last office visit okay to be weightbearing as tolerated on that lower extremity

## 2017-04-25 NOTE — Telephone Encounter (Signed)
Patient's daughter called stating that she has some questions in regards to his care.  He is in memory care at [facility].  He is also in Hospice. She is wanting to know if it would be beneficial to him and the leg edema to get up and move around.  CB#[phone]

## 2017-04-29 DIAGNOSIS — I672 Cerebral atherosclerosis: Secondary | ICD-10-CM | POA: Diagnosis not present

## 2017-04-29 DIAGNOSIS — F0151 Vascular dementia with behavioral disturbance: Secondary | ICD-10-CM | POA: Diagnosis not present

## 2017-04-29 DIAGNOSIS — I350 Nonrheumatic aortic (valve) stenosis: Secondary | ICD-10-CM | POA: Diagnosis not present

## 2017-04-29 DIAGNOSIS — I4891 Unspecified atrial fibrillation: Secondary | ICD-10-CM | POA: Diagnosis not present

## 2017-04-29 DIAGNOSIS — N184 Chronic kidney disease, stage 4 (severe): Secondary | ICD-10-CM | POA: Diagnosis not present

## 2017-04-29 DIAGNOSIS — E46 Unspecified protein-calorie malnutrition: Secondary | ICD-10-CM | POA: Diagnosis not present

## 2017-05-01 DIAGNOSIS — I4891 Unspecified atrial fibrillation: Secondary | ICD-10-CM | POA: Diagnosis not present

## 2017-05-01 DIAGNOSIS — I350 Nonrheumatic aortic (valve) stenosis: Secondary | ICD-10-CM | POA: Diagnosis not present

## 2017-05-01 DIAGNOSIS — N184 Chronic kidney disease, stage 4 (severe): Secondary | ICD-10-CM | POA: Diagnosis not present

## 2017-05-01 DIAGNOSIS — I672 Cerebral atherosclerosis: Secondary | ICD-10-CM | POA: Diagnosis not present

## 2017-05-01 DIAGNOSIS — F0151 Vascular dementia with behavioral disturbance: Secondary | ICD-10-CM | POA: Diagnosis not present

## 2017-05-01 DIAGNOSIS — E46 Unspecified protein-calorie malnutrition: Secondary | ICD-10-CM | POA: Diagnosis not present

## 2017-05-02 DIAGNOSIS — N401 Enlarged prostate with lower urinary tract symptoms: Secondary | ICD-10-CM | POA: Diagnosis not present

## 2017-05-02 DIAGNOSIS — I4891 Unspecified atrial fibrillation: Secondary | ICD-10-CM | POA: Diagnosis not present

## 2017-05-02 DIAGNOSIS — I672 Cerebral atherosclerosis: Secondary | ICD-10-CM | POA: Diagnosis not present

## 2017-05-02 DIAGNOSIS — K279 Peptic ulcer, site unspecified, unspecified as acute or chronic, without hemorrhage or perforation: Secondary | ICD-10-CM | POA: Diagnosis not present

## 2017-05-02 DIAGNOSIS — N184 Chronic kidney disease, stage 4 (severe): Secondary | ICD-10-CM | POA: Diagnosis not present

## 2017-05-02 DIAGNOSIS — I350 Nonrheumatic aortic (valve) stenosis: Secondary | ICD-10-CM | POA: Diagnosis not present

## 2017-05-02 DIAGNOSIS — R54 Age-related physical debility: Secondary | ICD-10-CM | POA: Diagnosis not present

## 2017-05-02 DIAGNOSIS — F0151 Vascular dementia with behavioral disturbance: Secondary | ICD-10-CM | POA: Diagnosis not present

## 2017-05-02 DIAGNOSIS — E039 Hypothyroidism, unspecified: Secondary | ICD-10-CM | POA: Diagnosis not present

## 2017-05-02 DIAGNOSIS — E46 Unspecified protein-calorie malnutrition: Secondary | ICD-10-CM | POA: Diagnosis not present

## 2017-05-05 DIAGNOSIS — N184 Chronic kidney disease, stage 4 (severe): Secondary | ICD-10-CM | POA: Diagnosis not present

## 2017-05-05 DIAGNOSIS — F0151 Vascular dementia with behavioral disturbance: Secondary | ICD-10-CM | POA: Diagnosis not present

## 2017-05-05 DIAGNOSIS — E46 Unspecified protein-calorie malnutrition: Secondary | ICD-10-CM | POA: Diagnosis not present

## 2017-05-05 DIAGNOSIS — I350 Nonrheumatic aortic (valve) stenosis: Secondary | ICD-10-CM | POA: Diagnosis not present

## 2017-05-05 DIAGNOSIS — I4891 Unspecified atrial fibrillation: Secondary | ICD-10-CM | POA: Diagnosis not present

## 2017-05-05 DIAGNOSIS — I672 Cerebral atherosclerosis: Secondary | ICD-10-CM | POA: Diagnosis not present

## 2017-05-06 DIAGNOSIS — I350 Nonrheumatic aortic (valve) stenosis: Secondary | ICD-10-CM | POA: Diagnosis not present

## 2017-05-06 DIAGNOSIS — E46 Unspecified protein-calorie malnutrition: Secondary | ICD-10-CM | POA: Diagnosis not present

## 2017-05-06 DIAGNOSIS — I4891 Unspecified atrial fibrillation: Secondary | ICD-10-CM | POA: Diagnosis not present

## 2017-05-06 DIAGNOSIS — I672 Cerebral atherosclerosis: Secondary | ICD-10-CM | POA: Diagnosis not present

## 2017-05-06 DIAGNOSIS — F0151 Vascular dementia with behavioral disturbance: Secondary | ICD-10-CM | POA: Diagnosis not present

## 2017-05-06 DIAGNOSIS — N184 Chronic kidney disease, stage 4 (severe): Secondary | ICD-10-CM | POA: Diagnosis not present

## 2017-05-08 DIAGNOSIS — I4891 Unspecified atrial fibrillation: Secondary | ICD-10-CM | POA: Diagnosis not present

## 2017-05-08 DIAGNOSIS — N184 Chronic kidney disease, stage 4 (severe): Secondary | ICD-10-CM | POA: Diagnosis not present

## 2017-05-08 DIAGNOSIS — F0151 Vascular dementia with behavioral disturbance: Secondary | ICD-10-CM | POA: Diagnosis not present

## 2017-05-08 DIAGNOSIS — I350 Nonrheumatic aortic (valve) stenosis: Secondary | ICD-10-CM | POA: Diagnosis not present

## 2017-05-08 DIAGNOSIS — I672 Cerebral atherosclerosis: Secondary | ICD-10-CM | POA: Diagnosis not present

## 2017-05-08 DIAGNOSIS — E46 Unspecified protein-calorie malnutrition: Secondary | ICD-10-CM | POA: Diagnosis not present

## 2017-05-13 DIAGNOSIS — F0151 Vascular dementia with behavioral disturbance: Secondary | ICD-10-CM | POA: Diagnosis not present

## 2017-05-13 DIAGNOSIS — I350 Nonrheumatic aortic (valve) stenosis: Secondary | ICD-10-CM | POA: Diagnosis not present

## 2017-05-13 DIAGNOSIS — I672 Cerebral atherosclerosis: Secondary | ICD-10-CM | POA: Diagnosis not present

## 2017-05-13 DIAGNOSIS — E46 Unspecified protein-calorie malnutrition: Secondary | ICD-10-CM | POA: Diagnosis not present

## 2017-05-13 DIAGNOSIS — N184 Chronic kidney disease, stage 4 (severe): Secondary | ICD-10-CM | POA: Diagnosis not present

## 2017-05-13 DIAGNOSIS — I4891 Unspecified atrial fibrillation: Secondary | ICD-10-CM | POA: Diagnosis not present

## 2017-05-14 ENCOUNTER — Ambulatory Visit (INDEPENDENT_AMBULATORY_CARE_PROVIDER_SITE_OTHER): Payer: Medicare Other | Admitting: Orthopedic Surgery

## 2017-05-14 ENCOUNTER — Encounter (INDEPENDENT_AMBULATORY_CARE_PROVIDER_SITE_OTHER): Payer: Self-pay | Admitting: Orthopedic Surgery

## 2017-05-14 DIAGNOSIS — Z09 Encounter for follow-up examination after completed treatment for conditions other than malignant neoplasm: Secondary | ICD-10-CM

## 2017-05-15 DIAGNOSIS — F0151 Vascular dementia with behavioral disturbance: Secondary | ICD-10-CM | POA: Diagnosis not present

## 2017-05-15 DIAGNOSIS — N184 Chronic kidney disease, stage 4 (severe): Secondary | ICD-10-CM | POA: Diagnosis not present

## 2017-05-15 DIAGNOSIS — E46 Unspecified protein-calorie malnutrition: Secondary | ICD-10-CM | POA: Diagnosis not present

## 2017-05-15 DIAGNOSIS — I672 Cerebral atherosclerosis: Secondary | ICD-10-CM | POA: Diagnosis not present

## 2017-05-15 DIAGNOSIS — I4891 Unspecified atrial fibrillation: Secondary | ICD-10-CM | POA: Diagnosis not present

## 2017-05-15 DIAGNOSIS — I350 Nonrheumatic aortic (valve) stenosis: Secondary | ICD-10-CM | POA: Diagnosis not present

## 2017-05-16 DIAGNOSIS — N184 Chronic kidney disease, stage 4 (severe): Secondary | ICD-10-CM | POA: Diagnosis not present

## 2017-05-16 DIAGNOSIS — E46 Unspecified protein-calorie malnutrition: Secondary | ICD-10-CM | POA: Diagnosis not present

## 2017-05-16 DIAGNOSIS — I350 Nonrheumatic aortic (valve) stenosis: Secondary | ICD-10-CM | POA: Diagnosis not present

## 2017-05-16 DIAGNOSIS — I4891 Unspecified atrial fibrillation: Secondary | ICD-10-CM | POA: Diagnosis not present

## 2017-05-16 DIAGNOSIS — F0151 Vascular dementia with behavioral disturbance: Secondary | ICD-10-CM | POA: Diagnosis not present

## 2017-05-16 DIAGNOSIS — I672 Cerebral atherosclerosis: Secondary | ICD-10-CM | POA: Diagnosis not present

## 2017-05-17 ENCOUNTER — Encounter (INDEPENDENT_AMBULATORY_CARE_PROVIDER_SITE_OTHER): Payer: Self-pay | Admitting: Orthopedic Surgery

## 2017-05-17 NOTE — Progress Notes (Signed)
Post-Op Visit Note   Patient: Justin Chambers           Date of Birth: 11-22-1923           MRN: [id number] Visit Date: 05/14/2017 PCP: Burnard Bunting, MD   Assessment & Plan:  Chief Complaint:  Chief Complaint  Patient presents with  . Follow-up    pubic rami and sacral fracture   Visit Diagnoses:  1. Fracture follow-up     Plan: Justin Chambers is a patient who is now over 6 weeks out from pubic rami and sacral ala fracture on the right-hand side.  He states he is feeling better.  He has been walking some.  He is mostly however in a wheelchair.  He lives in [place].  He is in hospice care.  He has dementia.  On examination he has good range of motion of bilateral hip joints.  Do not detect much pain with loading of that right hip joint.  Plan at this time is weightbearing as tolerated.  Follow-up as needed.  In general his fractures have healed enough for him to continue his limited weightbearing status which his condition allows.  Follow-Up Instructions: Return if symptoms worsen or fail to improve.   Orders:  No orders of the defined types were placed in this encounter.  No orders of the defined types were placed in this encounter.   Imaging: No results found.  PMFS History: Patient Active Problem List   Diagnosis Date Noted  . Fall 03/18/2017  . Closed nondisplaced fracture of pelvis ([place])   . Severe protein-calorie malnutrition ([place])   . CHF (congestive heart failure) ([place]) 12/13/2016  . Fluid overload 12/13/2016  . Acute respiratory failure with hypoxia ([place]) 12/13/2016  . Hematuria, gross   . Sepsis ([place]) 09/09/2015  . Hematuria 09/09/2015  . Hypotension 09/09/2015  . Aortic stenosis 07/29/2015  . Hypothyroidism 07/29/2015  . Anemia due to chronic blood loss 07/28/2015  . DOE (dyspnea on exertion) 07/28/2015  . Blood loss anemia 07/28/2015  . Sick sinus syndrome ([place]) 04/24/2014  . Microcytic anemia 02/17/2014  . Chronic anticoagulation 02/17/2014  .  Acute gangrenous cholecystitis 05/18/2013  . Cholecystitis 04/24/2013  . Chronic kidney disease (CKD) stage G3a/A3, moderately decreased glomerular filtration rate (GFR) between 45-59 mL/min/1.73 square meter and albuminuria creatinine ratio greater than 300 mg/g ([facility]) 04/24/2013    Class: Chronic  . Atrial fibrillation ([place]) 04/24/2013  . Alzheimer disease 04/24/2013   Past Medical History:  Diagnosis Date  . Alzheimer disease   . Anemia due to chronic blood loss 07/2015  . Atrial fibrillation ([place])   . Cholecystitis   . Chronic kidney disease   . CKD (chronic kidney disease)   . Dementia   . DOE (dyspnea on exertion)   . Fracture    right leg(casted- high school), collar bone(child)  . Hypothyroidism   . Transfusion history    in past ? when    Family History  Problem Relation Age of Onset  . Diabetes Neg Hx   . CAD Neg Hx     Past Surgical History:  Procedure Laterality Date  . ABDOMINAL SURGERY     part of stomach removed  . CHOLECYSTECTOMY N/A 04/27/2013   Procedure: LAPAROSCOPIC CHOLECYSTECTOMY WITH INTRAOPERATIVE CHOLANGIOGRAM;  Surgeon: Imogene Burn. [name], MD;  Location: [place];  Service: General;  Laterality: N/A;  . ESOPHAGOGASTRODUODENOSCOPY N/A 07/30/2015   Procedure: ESOPHAGOGASTRODUODENOSCOPY (EGD);  Surgeon: Ronald Lobo, MD;  Location: [facility] ENDOSCOPY;  Service: Endoscopy;  Laterality: N/A;  Pediatric UPPER endoscope, please  . LAPAROSCOPIC LYSIS OF ADHESIONS N/A 04/27/2013   Procedure: LAPAROSCOPIC LYSIS OF ADHESIONS;  Surgeon: Imogene Burn. [name], MD;  Location: [place] [facility] OR;  Service: General;  Laterality: N/A;  . PARTIAL GASTRECTOMY Bilateral   . TONSILLECTOMY     Social History   Occupational History  . Not on file  Tobacco Use  . Smoking status: Never Smoker  . Smokeless tobacco: Never Used  Substance and Sexual Activity  . Alcohol use: No  . Drug use: No  . Sexual activity: No

## 2017-05-19 DIAGNOSIS — I4891 Unspecified atrial fibrillation: Secondary | ICD-10-CM | POA: Diagnosis not present

## 2017-05-19 DIAGNOSIS — F0151 Vascular dementia with behavioral disturbance: Secondary | ICD-10-CM | POA: Diagnosis not present

## 2017-05-19 DIAGNOSIS — I672 Cerebral atherosclerosis: Secondary | ICD-10-CM | POA: Diagnosis not present

## 2017-05-19 DIAGNOSIS — E46 Unspecified protein-calorie malnutrition: Secondary | ICD-10-CM | POA: Diagnosis not present

## 2017-05-19 DIAGNOSIS — I350 Nonrheumatic aortic (valve) stenosis: Secondary | ICD-10-CM | POA: Diagnosis not present

## 2017-05-19 DIAGNOSIS — N184 Chronic kidney disease, stage 4 (severe): Secondary | ICD-10-CM | POA: Diagnosis not present

## 2017-05-20 DIAGNOSIS — F0151 Vascular dementia with behavioral disturbance: Secondary | ICD-10-CM | POA: Diagnosis not present

## 2017-05-20 DIAGNOSIS — I4891 Unspecified atrial fibrillation: Secondary | ICD-10-CM | POA: Diagnosis not present

## 2017-05-20 DIAGNOSIS — N184 Chronic kidney disease, stage 4 (severe): Secondary | ICD-10-CM | POA: Diagnosis not present

## 2017-05-20 DIAGNOSIS — I672 Cerebral atherosclerosis: Secondary | ICD-10-CM | POA: Diagnosis not present

## 2017-05-20 DIAGNOSIS — I350 Nonrheumatic aortic (valve) stenosis: Secondary | ICD-10-CM | POA: Diagnosis not present

## 2017-05-20 DIAGNOSIS — E46 Unspecified protein-calorie malnutrition: Secondary | ICD-10-CM | POA: Diagnosis not present

## 2017-05-22 DIAGNOSIS — I672 Cerebral atherosclerosis: Secondary | ICD-10-CM | POA: Diagnosis not present

## 2017-05-22 DIAGNOSIS — I4891 Unspecified atrial fibrillation: Secondary | ICD-10-CM | POA: Diagnosis not present

## 2017-05-22 DIAGNOSIS — I350 Nonrheumatic aortic (valve) stenosis: Secondary | ICD-10-CM | POA: Diagnosis not present

## 2017-05-22 DIAGNOSIS — F0151 Vascular dementia with behavioral disturbance: Secondary | ICD-10-CM | POA: Diagnosis not present

## 2017-05-22 DIAGNOSIS — E46 Unspecified protein-calorie malnutrition: Secondary | ICD-10-CM | POA: Diagnosis not present

## 2017-05-22 DIAGNOSIS — N184 Chronic kidney disease, stage 4 (severe): Secondary | ICD-10-CM | POA: Diagnosis not present

## 2017-05-23 DIAGNOSIS — E46 Unspecified protein-calorie malnutrition: Secondary | ICD-10-CM | POA: Diagnosis not present

## 2017-05-23 DIAGNOSIS — I350 Nonrheumatic aortic (valve) stenosis: Secondary | ICD-10-CM | POA: Diagnosis not present

## 2017-05-23 DIAGNOSIS — N184 Chronic kidney disease, stage 4 (severe): Secondary | ICD-10-CM | POA: Diagnosis not present

## 2017-05-23 DIAGNOSIS — F0151 Vascular dementia with behavioral disturbance: Secondary | ICD-10-CM | POA: Diagnosis not present

## 2017-05-23 DIAGNOSIS — I4891 Unspecified atrial fibrillation: Secondary | ICD-10-CM | POA: Diagnosis not present

## 2017-05-23 DIAGNOSIS — I672 Cerebral atherosclerosis: Secondary | ICD-10-CM | POA: Diagnosis not present

## 2017-05-23 IMAGING — CR DG CHEST 1V PORT
1 series · 1 of 1 positions shown · non-contrast
Comparison: Portable exam 4034 hours compared to 05/09/2008

CLINICAL DATA: Hematuria for 6 days, sepsis, atrial fibrillation,
dementia/Alzheimer's, chronic kidney disease

EXAM:
PORTABLE CHEST 1 VIEW

[AP]
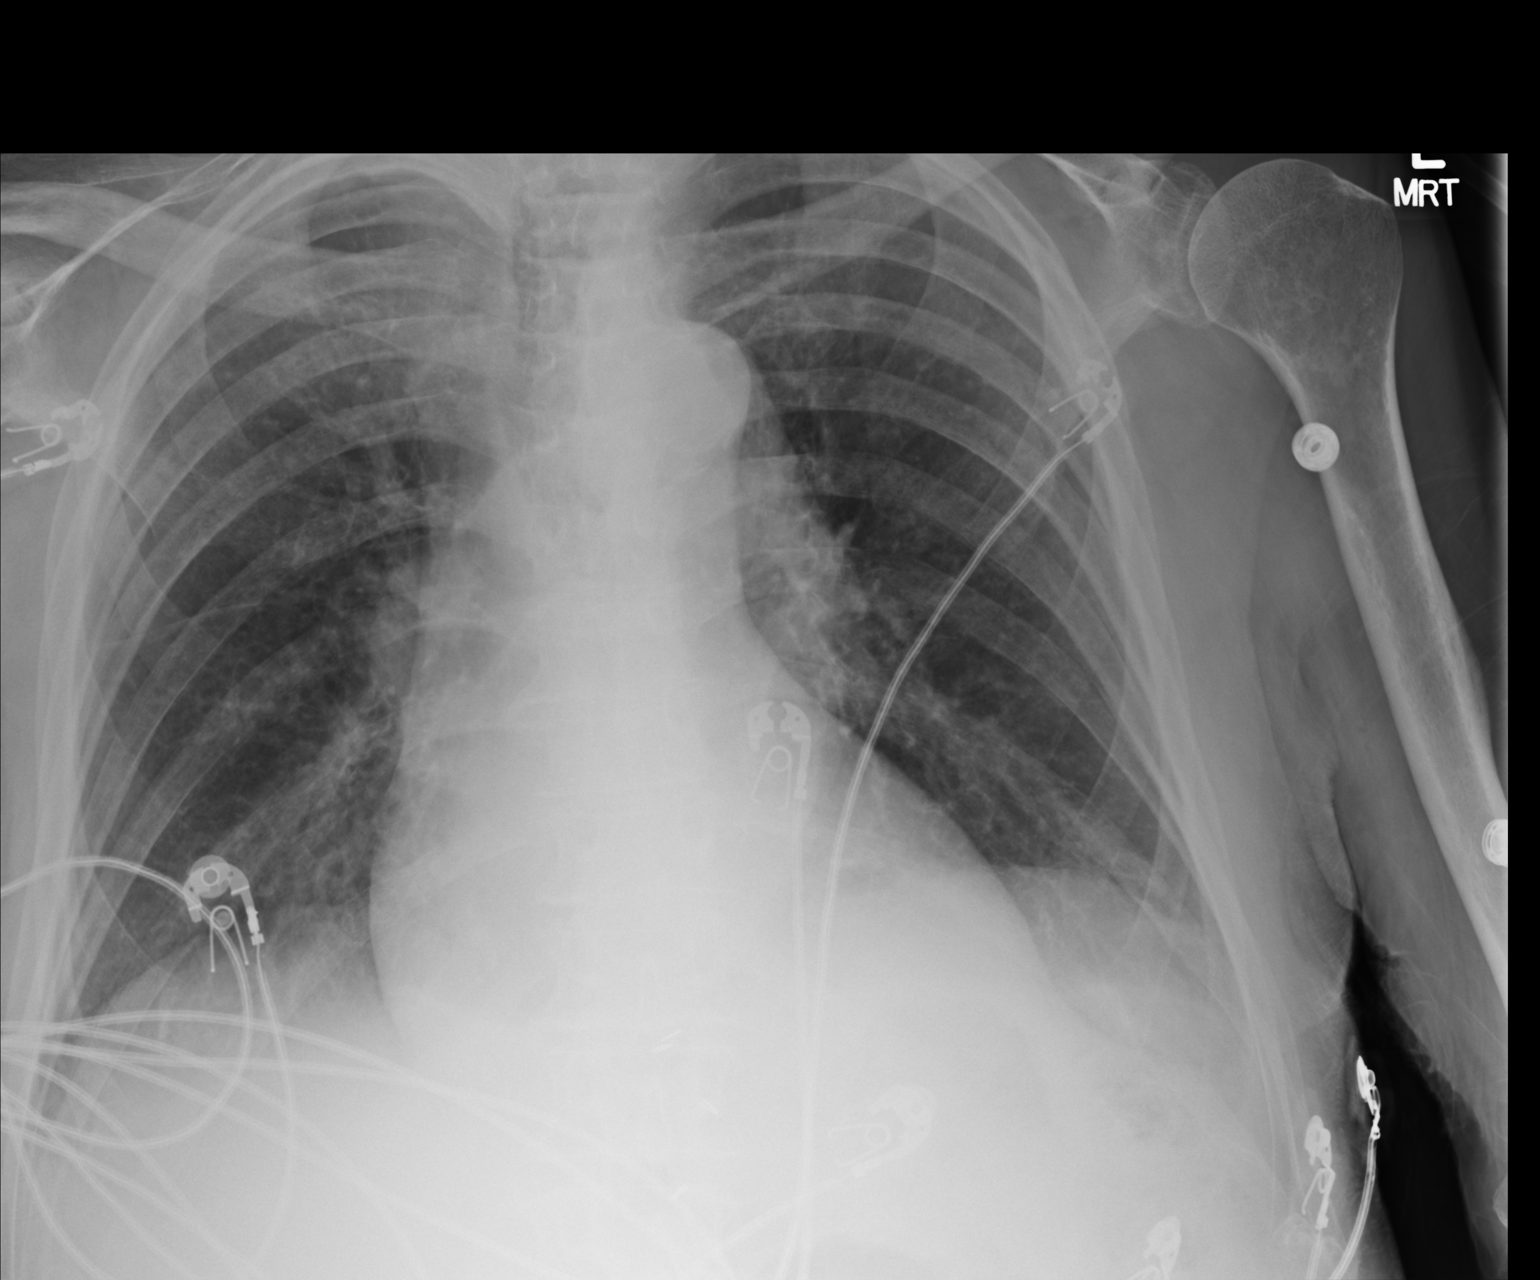

[1 of 1 positions shown; findings below may reference images not displayed]

FINDINGS: Minimal enlargement of cardiac silhouette.

Atherosclerotic calcification and tortuosity of thoracic aorta.

Mediastinal contours and pulmonary vascularity normal.

Bronchitic changes with minimal bibasilar atelectasis.

Lungs otherwise clear.

No pleural effusion or pneumothorax.

Bones demineralized.
IMPRESSION: Minimal enlargement of cardiac silhouette.

Bronchitic changes with minimal bibasilar atelectasis.

Aortic atherosclerosis.

## 2017-05-25 IMAGING — CT CT ABD-PELV W/O CM
2 of 4 series · 15 of 46 positions shown, 17 images · non-contrast
Comparison: 04/24/2013

CLINICAL DATA: Pseudomonas sepsis with recurrent gross hematuria.
Recent cysto showed severe trabeculation and bladder diverticuli but
no stones or tumors.

EXAM:
CT ABDOMEN AND PELVIS WITHOUT CONTRAST
TECHNIQUE: Multidetector CT imaging of the abdomen and pelvis was performed
following the standard protocol without IV contrast.

[Series 2: abd/ pelvis 5.0 i30f 1 · axial · 0.86mm/px · z∈[+865,+1215]mm · 12 of 84 slices shown, 14 images]
[im 7/84  soft-tissue]
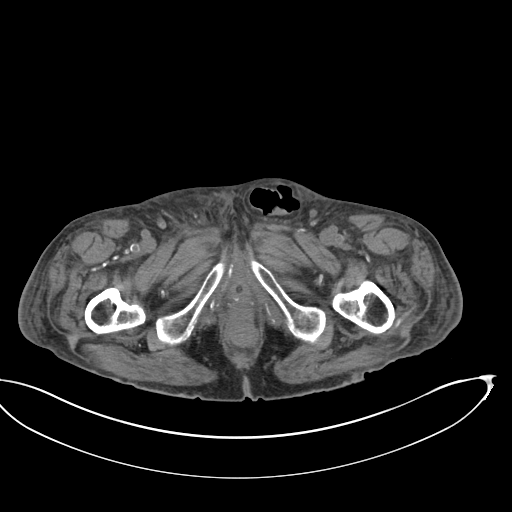
[im 7/84  bone]
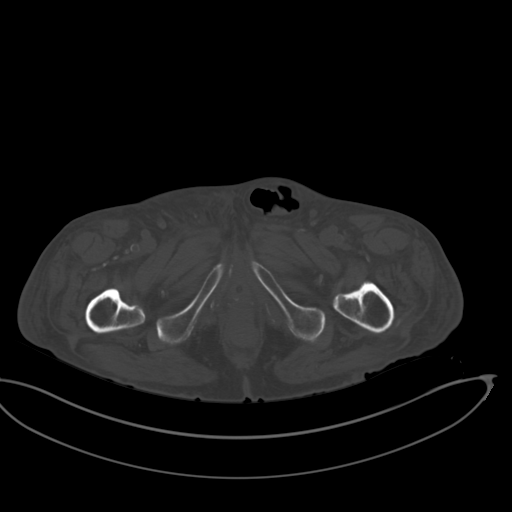
[im 13/84  soft-tissue]
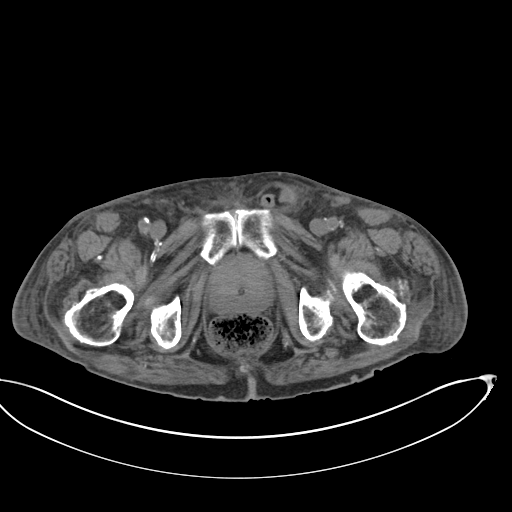
[im 19/84  soft-tissue]
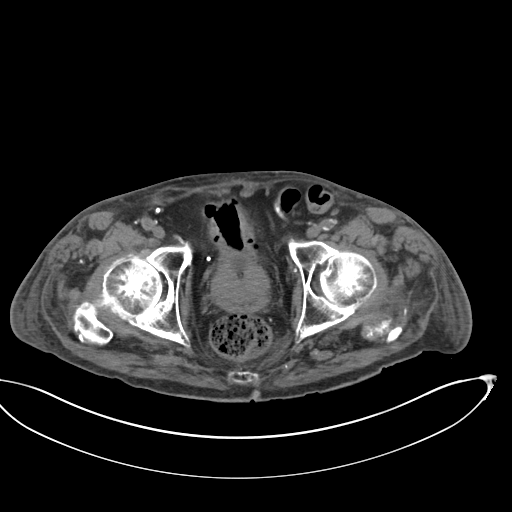
[im 25/84  soft-tissue]
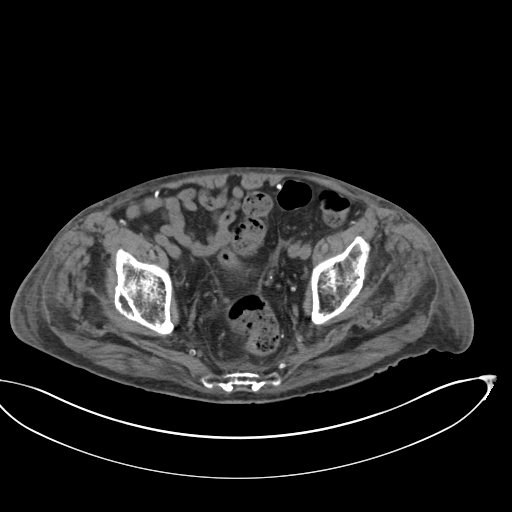
[im 31/84  soft-tissue]
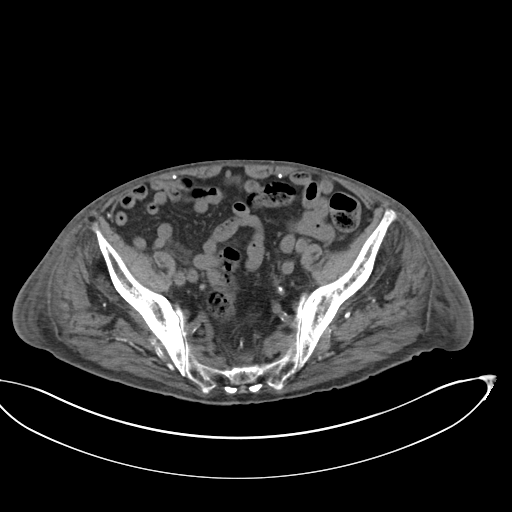
[im 37/84  soft-tissue]
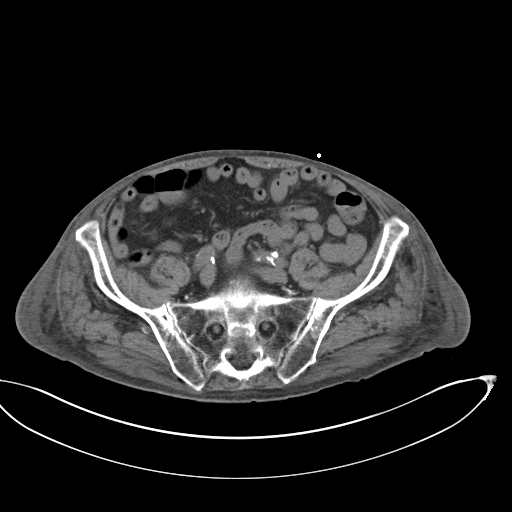
[im 47/84  soft-tissue]
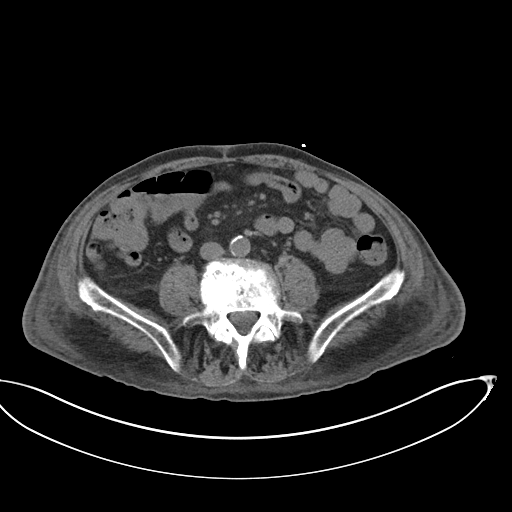
[im 53/84  soft-tissue]
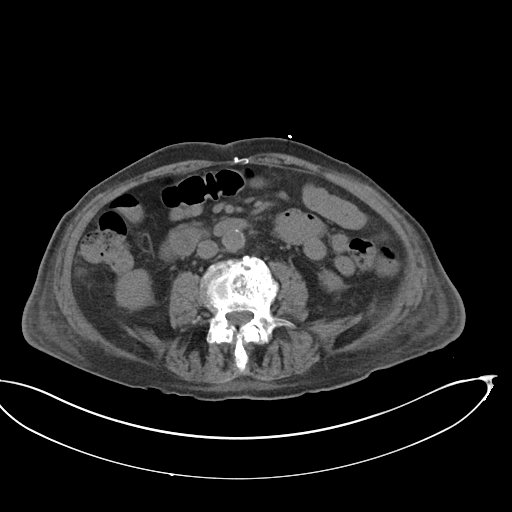
[im 59/84  soft-tissue]
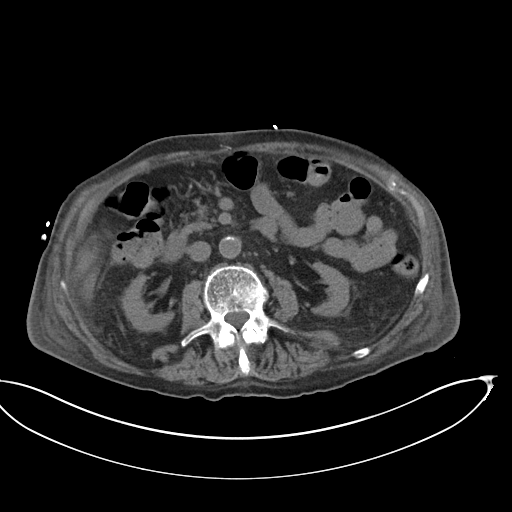
[im 59/84  bone]
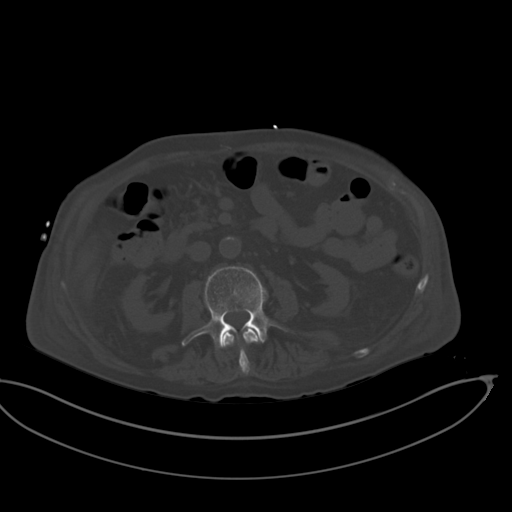
[im 65/84  soft-tissue]
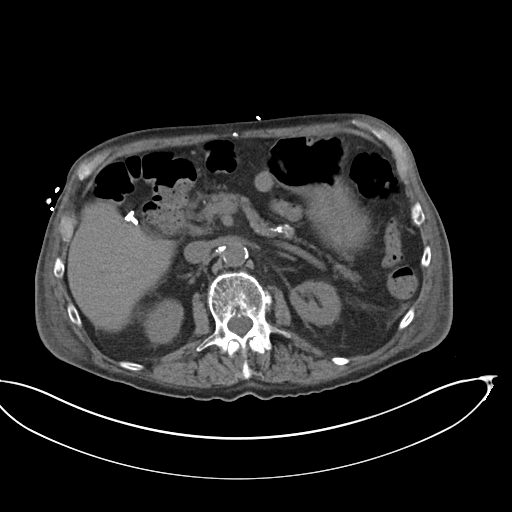
[im 71/84  soft-tissue]
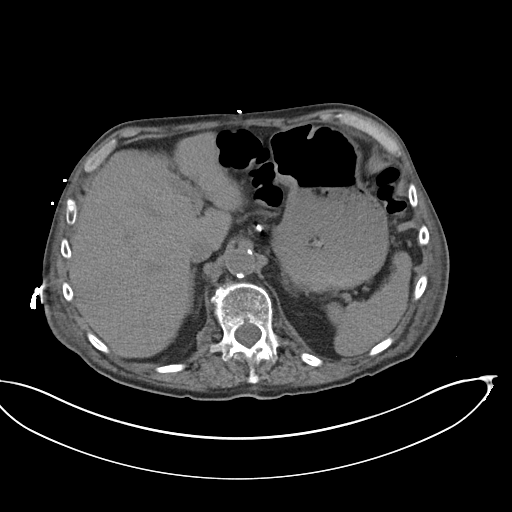
[im 77/84  soft-tissue]
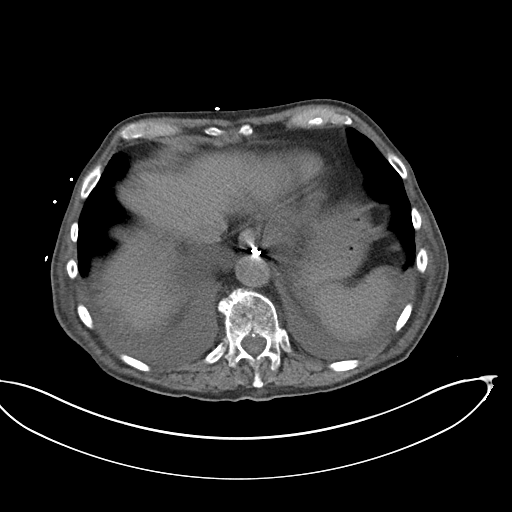

[Series 5: cor st · coronal · 0.79mm/px · 3 of 101 slices shown]
[im 34/101  soft-tissue]
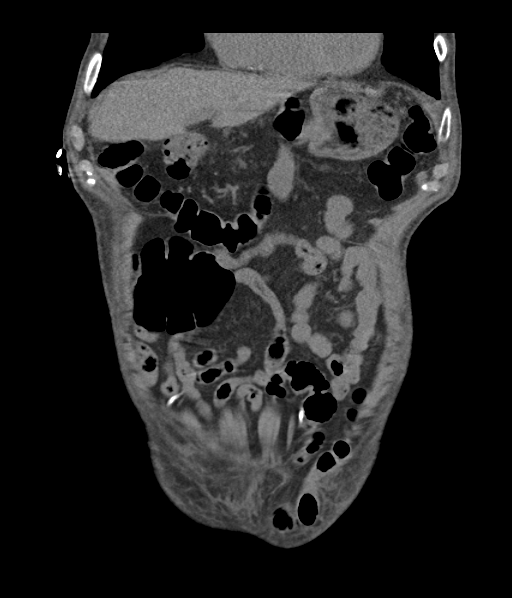
[im 45/101  soft-tissue]
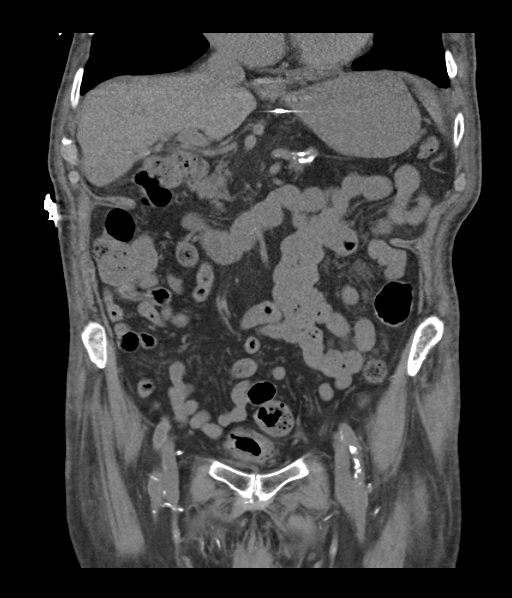
[im 56/101  soft-tissue]
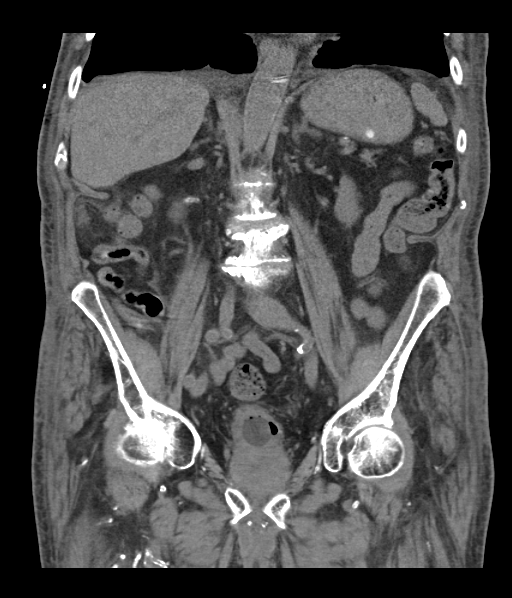

[15 of 46 positions shown; findings below may reference images not displayed]

FINDINGS: Lower chest: Small to moderate bilateral pleural effusions with
dependent lower lobe atelectasis.

Hepatobiliary: No focal abnormality in the liver on this study
without intravenous contrast. No evidence of hepatomegaly.
Gallbladder surgically absent. No intrahepatic or extrahepatic
biliary dilation.

Pancreas: Pancreatic parenchyma is diffusely atrophic.

Spleen: No splenomegaly. No focal mass lesion.

Adrenals/Urinary Tract: No adrenal nodule or mass. 2.2 cm interpolar
right renal lesion measures water density and was about 2 cm on the
previous study. Although not fully characterized given lack of
intravenous contrast, this is compatible with a cyst. A pair of
small probable cysts in the upper pole of the left kidney are also
not substantially changed measuring about 11 mm each. No evidence
for renal stones. No hydronephrosis. No ureteral stones or
hydroureter. Bladder decompressed by Foley catheter. Gas in the
bladder lumen may be related to recent cystoscopy or presence of a
Foley catheter.

Stomach/Bowel: Stomach is nondistended. No gastric wall thickening.
Patient is status post distal gastrectomy Duodenum is normally
positioned as is the ligament of Treitz. No small bowel wall
thickening. No small bowel dilatation. The terminal ileum is normal.
The appendix is not visualized, but there is no edema or
inflammation in the region of the cecum. Diverticular changes are
noted in the left colon without evidence of diverticulitis.

Vascular/Lymphatic: There is abdominal aortic atherosclerosis
without aneurysm. There is no gastrohepatic or hepatoduodenal
ligament lymphadenopathy. No intraperitoneal or retroperitoneal
lymphadenopathy. No pelvic sidewall lymphadenopathy.

Reproductive: Prostate gland is markedly enlarged.

Other: No intraperitoneal free fluid.

Musculoskeletal: Left inguinal hernia contains a loop of sigmoid
colon without complicating features. Diffuse body wall edema is
evident. Bone windows reveal no worrisome lytic or sclerotic osseous
lesions.
IMPRESSION: 1. No specific findings by CT to explain the patient's history of
hematuria. Specifically, no urinary obstruction or urinary stones.
Marked prostatomegaly is evident.
2. Small to moderate bilateral pleural effusions with dependent
atelectasis in the lower lobes.
3. Small bilateral renal cysts.
4. Status post distal gastrectomy.
5. Left inguinal hernia contains a loop of sigmoid colon without
complicating features.
Imaging findings of potential clinical significance:

Abdominal Aortic Atherosclerois ([id number]-170.0)

## 2017-05-27 DIAGNOSIS — N184 Chronic kidney disease, stage 4 (severe): Secondary | ICD-10-CM | POA: Diagnosis not present

## 2017-05-27 DIAGNOSIS — I4891 Unspecified atrial fibrillation: Secondary | ICD-10-CM | POA: Diagnosis not present

## 2017-05-27 DIAGNOSIS — I350 Nonrheumatic aortic (valve) stenosis: Secondary | ICD-10-CM | POA: Diagnosis not present

## 2017-05-27 DIAGNOSIS — E46 Unspecified protein-calorie malnutrition: Secondary | ICD-10-CM | POA: Diagnosis not present

## 2017-05-27 DIAGNOSIS — F0151 Vascular dementia with behavioral disturbance: Secondary | ICD-10-CM | POA: Diagnosis not present

## 2017-05-27 DIAGNOSIS — I672 Cerebral atherosclerosis: Secondary | ICD-10-CM | POA: Diagnosis not present

## 2017-05-29 DIAGNOSIS — E46 Unspecified protein-calorie malnutrition: Secondary | ICD-10-CM | POA: Diagnosis not present

## 2017-05-29 DIAGNOSIS — F0151 Vascular dementia with behavioral disturbance: Secondary | ICD-10-CM | POA: Diagnosis not present

## 2017-05-29 DIAGNOSIS — N184 Chronic kidney disease, stage 4 (severe): Secondary | ICD-10-CM | POA: Diagnosis not present

## 2017-05-29 DIAGNOSIS — I672 Cerebral atherosclerosis: Secondary | ICD-10-CM | POA: Diagnosis not present

## 2017-05-29 DIAGNOSIS — I4891 Unspecified atrial fibrillation: Secondary | ICD-10-CM | POA: Diagnosis not present

## 2017-05-29 DIAGNOSIS — I350 Nonrheumatic aortic (valve) stenosis: Secondary | ICD-10-CM | POA: Diagnosis not present

## 2017-05-30 DIAGNOSIS — F0151 Vascular dementia with behavioral disturbance: Secondary | ICD-10-CM | POA: Diagnosis not present

## 2017-05-30 DIAGNOSIS — I672 Cerebral atherosclerosis: Secondary | ICD-10-CM | POA: Diagnosis not present

## 2017-05-30 DIAGNOSIS — E46 Unspecified protein-calorie malnutrition: Secondary | ICD-10-CM | POA: Diagnosis not present

## 2017-05-30 DIAGNOSIS — N184 Chronic kidney disease, stage 4 (severe): Secondary | ICD-10-CM | POA: Diagnosis not present

## 2017-05-30 DIAGNOSIS — I350 Nonrheumatic aortic (valve) stenosis: Secondary | ICD-10-CM | POA: Diagnosis not present

## 2017-05-30 DIAGNOSIS — I4891 Unspecified atrial fibrillation: Secondary | ICD-10-CM | POA: Diagnosis not present

## 2017-06-02 DIAGNOSIS — I672 Cerebral atherosclerosis: Secondary | ICD-10-CM | POA: Diagnosis not present

## 2017-06-02 DIAGNOSIS — E46 Unspecified protein-calorie malnutrition: Secondary | ICD-10-CM | POA: Diagnosis not present

## 2017-06-02 DIAGNOSIS — N401 Enlarged prostate with lower urinary tract symptoms: Secondary | ICD-10-CM | POA: Diagnosis not present

## 2017-06-02 DIAGNOSIS — K279 Peptic ulcer, site unspecified, unspecified as acute or chronic, without hemorrhage or perforation: Secondary | ICD-10-CM | POA: Diagnosis not present

## 2017-06-02 DIAGNOSIS — E039 Hypothyroidism, unspecified: Secondary | ICD-10-CM | POA: Diagnosis not present

## 2017-06-02 DIAGNOSIS — R54 Age-related physical debility: Secondary | ICD-10-CM | POA: Diagnosis not present

## 2017-06-02 DIAGNOSIS — I350 Nonrheumatic aortic (valve) stenosis: Secondary | ICD-10-CM | POA: Diagnosis not present

## 2017-06-02 DIAGNOSIS — F0151 Vascular dementia with behavioral disturbance: Secondary | ICD-10-CM | POA: Diagnosis not present

## 2017-06-02 DIAGNOSIS — N184 Chronic kidney disease, stage 4 (severe): Secondary | ICD-10-CM | POA: Diagnosis not present

## 2017-06-02 DIAGNOSIS — I4891 Unspecified atrial fibrillation: Secondary | ICD-10-CM | POA: Diagnosis not present

## 2017-06-03 DIAGNOSIS — I4891 Unspecified atrial fibrillation: Secondary | ICD-10-CM | POA: Diagnosis not present

## 2017-06-03 DIAGNOSIS — N184 Chronic kidney disease, stage 4 (severe): Secondary | ICD-10-CM | POA: Diagnosis not present

## 2017-06-03 DIAGNOSIS — E46 Unspecified protein-calorie malnutrition: Secondary | ICD-10-CM | POA: Diagnosis not present

## 2017-06-03 DIAGNOSIS — F0151 Vascular dementia with behavioral disturbance: Secondary | ICD-10-CM | POA: Diagnosis not present

## 2017-06-03 DIAGNOSIS — I350 Nonrheumatic aortic (valve) stenosis: Secondary | ICD-10-CM | POA: Diagnosis not present

## 2017-06-03 DIAGNOSIS — I672 Cerebral atherosclerosis: Secondary | ICD-10-CM | POA: Diagnosis not present

## 2017-06-04 DIAGNOSIS — I350 Nonrheumatic aortic (valve) stenosis: Secondary | ICD-10-CM | POA: Diagnosis not present

## 2017-06-04 DIAGNOSIS — E46 Unspecified protein-calorie malnutrition: Secondary | ICD-10-CM | POA: Diagnosis not present

## 2017-06-04 DIAGNOSIS — N184 Chronic kidney disease, stage 4 (severe): Secondary | ICD-10-CM | POA: Diagnosis not present

## 2017-06-04 DIAGNOSIS — I672 Cerebral atherosclerosis: Secondary | ICD-10-CM | POA: Diagnosis not present

## 2017-06-04 DIAGNOSIS — I4891 Unspecified atrial fibrillation: Secondary | ICD-10-CM | POA: Diagnosis not present

## 2017-06-04 DIAGNOSIS — F0151 Vascular dementia with behavioral disturbance: Secondary | ICD-10-CM | POA: Diagnosis not present

## 2017-06-05 DIAGNOSIS — I672 Cerebral atherosclerosis: Secondary | ICD-10-CM | POA: Diagnosis not present

## 2017-06-05 DIAGNOSIS — N184 Chronic kidney disease, stage 4 (severe): Secondary | ICD-10-CM | POA: Diagnosis not present

## 2017-06-05 DIAGNOSIS — I4891 Unspecified atrial fibrillation: Secondary | ICD-10-CM | POA: Diagnosis not present

## 2017-06-05 DIAGNOSIS — I350 Nonrheumatic aortic (valve) stenosis: Secondary | ICD-10-CM | POA: Diagnosis not present

## 2017-06-05 DIAGNOSIS — E46 Unspecified protein-calorie malnutrition: Secondary | ICD-10-CM | POA: Diagnosis not present

## 2017-06-05 DIAGNOSIS — F0151 Vascular dementia with behavioral disturbance: Secondary | ICD-10-CM | POA: Diagnosis not present

## 2017-06-06 DIAGNOSIS — I350 Nonrheumatic aortic (valve) stenosis: Secondary | ICD-10-CM | POA: Diagnosis not present

## 2017-06-06 DIAGNOSIS — F0151 Vascular dementia with behavioral disturbance: Secondary | ICD-10-CM | POA: Diagnosis not present

## 2017-06-06 DIAGNOSIS — I4891 Unspecified atrial fibrillation: Secondary | ICD-10-CM | POA: Diagnosis not present

## 2017-06-06 DIAGNOSIS — E46 Unspecified protein-calorie malnutrition: Secondary | ICD-10-CM | POA: Diagnosis not present

## 2017-06-06 DIAGNOSIS — I672 Cerebral atherosclerosis: Secondary | ICD-10-CM | POA: Diagnosis not present

## 2017-06-06 DIAGNOSIS — N184 Chronic kidney disease, stage 4 (severe): Secondary | ICD-10-CM | POA: Diagnosis not present

## 2017-06-10 DIAGNOSIS — F0151 Vascular dementia with behavioral disturbance: Secondary | ICD-10-CM | POA: Diagnosis not present

## 2017-06-10 DIAGNOSIS — N184 Chronic kidney disease, stage 4 (severe): Secondary | ICD-10-CM | POA: Diagnosis not present

## 2017-06-10 DIAGNOSIS — I672 Cerebral atherosclerosis: Secondary | ICD-10-CM | POA: Diagnosis not present

## 2017-06-10 DIAGNOSIS — I350 Nonrheumatic aortic (valve) stenosis: Secondary | ICD-10-CM | POA: Diagnosis not present

## 2017-06-10 DIAGNOSIS — E46 Unspecified protein-calorie malnutrition: Secondary | ICD-10-CM | POA: Diagnosis not present

## 2017-06-10 DIAGNOSIS — I4891 Unspecified atrial fibrillation: Secondary | ICD-10-CM | POA: Diagnosis not present

## 2017-06-12 DIAGNOSIS — I350 Nonrheumatic aortic (valve) stenosis: Secondary | ICD-10-CM | POA: Diagnosis not present

## 2017-06-12 DIAGNOSIS — I672 Cerebral atherosclerosis: Secondary | ICD-10-CM | POA: Diagnosis not present

## 2017-06-12 DIAGNOSIS — I4891 Unspecified atrial fibrillation: Secondary | ICD-10-CM | POA: Diagnosis not present

## 2017-06-12 DIAGNOSIS — N184 Chronic kidney disease, stage 4 (severe): Secondary | ICD-10-CM | POA: Diagnosis not present

## 2017-06-12 DIAGNOSIS — E46 Unspecified protein-calorie malnutrition: Secondary | ICD-10-CM | POA: Diagnosis not present

## 2017-06-12 DIAGNOSIS — F0151 Vascular dementia with behavioral disturbance: Secondary | ICD-10-CM | POA: Diagnosis not present

## 2017-06-13 DIAGNOSIS — I4891 Unspecified atrial fibrillation: Secondary | ICD-10-CM | POA: Diagnosis not present

## 2017-06-13 DIAGNOSIS — I672 Cerebral atherosclerosis: Secondary | ICD-10-CM | POA: Diagnosis not present

## 2017-06-13 DIAGNOSIS — F0151 Vascular dementia with behavioral disturbance: Secondary | ICD-10-CM | POA: Diagnosis not present

## 2017-06-13 DIAGNOSIS — I350 Nonrheumatic aortic (valve) stenosis: Secondary | ICD-10-CM | POA: Diagnosis not present

## 2017-06-13 DIAGNOSIS — E46 Unspecified protein-calorie malnutrition: Secondary | ICD-10-CM | POA: Diagnosis not present

## 2017-06-13 DIAGNOSIS — N184 Chronic kidney disease, stage 4 (severe): Secondary | ICD-10-CM | POA: Diagnosis not present

## 2017-06-17 DIAGNOSIS — F0151 Vascular dementia with behavioral disturbance: Secondary | ICD-10-CM | POA: Diagnosis not present

## 2017-06-17 DIAGNOSIS — N184 Chronic kidney disease, stage 4 (severe): Secondary | ICD-10-CM | POA: Diagnosis not present

## 2017-06-17 DIAGNOSIS — I4891 Unspecified atrial fibrillation: Secondary | ICD-10-CM | POA: Diagnosis not present

## 2017-06-17 DIAGNOSIS — I672 Cerebral atherosclerosis: Secondary | ICD-10-CM | POA: Diagnosis not present

## 2017-06-17 DIAGNOSIS — E46 Unspecified protein-calorie malnutrition: Secondary | ICD-10-CM | POA: Diagnosis not present

## 2017-06-17 DIAGNOSIS — I350 Nonrheumatic aortic (valve) stenosis: Secondary | ICD-10-CM | POA: Diagnosis not present

## 2017-06-19 DIAGNOSIS — E46 Unspecified protein-calorie malnutrition: Secondary | ICD-10-CM | POA: Diagnosis not present

## 2017-06-19 DIAGNOSIS — N184 Chronic kidney disease, stage 4 (severe): Secondary | ICD-10-CM | POA: Diagnosis not present

## 2017-06-19 DIAGNOSIS — I672 Cerebral atherosclerosis: Secondary | ICD-10-CM | POA: Diagnosis not present

## 2017-06-19 DIAGNOSIS — I4891 Unspecified atrial fibrillation: Secondary | ICD-10-CM | POA: Diagnosis not present

## 2017-06-19 DIAGNOSIS — I350 Nonrheumatic aortic (valve) stenosis: Secondary | ICD-10-CM | POA: Diagnosis not present

## 2017-06-19 DIAGNOSIS — F0151 Vascular dementia with behavioral disturbance: Secondary | ICD-10-CM | POA: Diagnosis not present

## 2017-06-24 DIAGNOSIS — I4891 Unspecified atrial fibrillation: Secondary | ICD-10-CM | POA: Diagnosis not present

## 2017-06-24 DIAGNOSIS — F0151 Vascular dementia with behavioral disturbance: Secondary | ICD-10-CM | POA: Diagnosis not present

## 2017-06-24 DIAGNOSIS — N184 Chronic kidney disease, stage 4 (severe): Secondary | ICD-10-CM | POA: Diagnosis not present

## 2017-06-24 DIAGNOSIS — E46 Unspecified protein-calorie malnutrition: Secondary | ICD-10-CM | POA: Diagnosis not present

## 2017-06-24 DIAGNOSIS — I672 Cerebral atherosclerosis: Secondary | ICD-10-CM | POA: Diagnosis not present

## 2017-06-24 DIAGNOSIS — I350 Nonrheumatic aortic (valve) stenosis: Secondary | ICD-10-CM | POA: Diagnosis not present

## 2017-06-27 DIAGNOSIS — F0151 Vascular dementia with behavioral disturbance: Secondary | ICD-10-CM | POA: Diagnosis not present

## 2017-06-27 DIAGNOSIS — I350 Nonrheumatic aortic (valve) stenosis: Secondary | ICD-10-CM | POA: Diagnosis not present

## 2017-06-27 DIAGNOSIS — I672 Cerebral atherosclerosis: Secondary | ICD-10-CM | POA: Diagnosis not present

## 2017-06-27 DIAGNOSIS — E46 Unspecified protein-calorie malnutrition: Secondary | ICD-10-CM | POA: Diagnosis not present

## 2017-06-27 DIAGNOSIS — N184 Chronic kidney disease, stage 4 (severe): Secondary | ICD-10-CM | POA: Diagnosis not present

## 2017-06-27 DIAGNOSIS — I4891 Unspecified atrial fibrillation: Secondary | ICD-10-CM | POA: Diagnosis not present

## 2017-06-28 DIAGNOSIS — I672 Cerebral atherosclerosis: Secondary | ICD-10-CM | POA: Diagnosis not present

## 2017-06-28 DIAGNOSIS — E46 Unspecified protein-calorie malnutrition: Secondary | ICD-10-CM | POA: Diagnosis not present

## 2017-06-28 DIAGNOSIS — I350 Nonrheumatic aortic (valve) stenosis: Secondary | ICD-10-CM | POA: Diagnosis not present

## 2017-06-28 DIAGNOSIS — N184 Chronic kidney disease, stage 4 (severe): Secondary | ICD-10-CM | POA: Diagnosis not present

## 2017-06-28 DIAGNOSIS — I4891 Unspecified atrial fibrillation: Secondary | ICD-10-CM | POA: Diagnosis not present

## 2017-06-28 DIAGNOSIS — F0151 Vascular dementia with behavioral disturbance: Secondary | ICD-10-CM | POA: Diagnosis not present

## 2017-07-01 DIAGNOSIS — F0151 Vascular dementia with behavioral disturbance: Secondary | ICD-10-CM | POA: Diagnosis not present

## 2017-07-01 DIAGNOSIS — E46 Unspecified protein-calorie malnutrition: Secondary | ICD-10-CM | POA: Diagnosis not present

## 2017-07-01 DIAGNOSIS — I350 Nonrheumatic aortic (valve) stenosis: Secondary | ICD-10-CM | POA: Diagnosis not present

## 2017-07-01 DIAGNOSIS — I672 Cerebral atherosclerosis: Secondary | ICD-10-CM | POA: Diagnosis not present

## 2017-07-01 DIAGNOSIS — I4891 Unspecified atrial fibrillation: Secondary | ICD-10-CM | POA: Diagnosis not present

## 2017-07-01 DIAGNOSIS — N184 Chronic kidney disease, stage 4 (severe): Secondary | ICD-10-CM | POA: Diagnosis not present

## 2017-07-02 DIAGNOSIS — F0151 Vascular dementia with behavioral disturbance: Secondary | ICD-10-CM | POA: Diagnosis not present

## 2017-07-02 DIAGNOSIS — N401 Enlarged prostate with lower urinary tract symptoms: Secondary | ICD-10-CM | POA: Diagnosis not present

## 2017-07-02 DIAGNOSIS — E46 Unspecified protein-calorie malnutrition: Secondary | ICD-10-CM | POA: Diagnosis not present

## 2017-07-02 DIAGNOSIS — R54 Age-related physical debility: Secondary | ICD-10-CM | POA: Diagnosis not present

## 2017-07-02 DIAGNOSIS — E039 Hypothyroidism, unspecified: Secondary | ICD-10-CM | POA: Diagnosis not present

## 2017-07-02 DIAGNOSIS — I672 Cerebral atherosclerosis: Secondary | ICD-10-CM | POA: Diagnosis not present

## 2017-07-02 DIAGNOSIS — I350 Nonrheumatic aortic (valve) stenosis: Secondary | ICD-10-CM | POA: Diagnosis not present

## 2017-07-02 DIAGNOSIS — I4891 Unspecified atrial fibrillation: Secondary | ICD-10-CM | POA: Diagnosis not present

## 2017-07-02 DIAGNOSIS — N184 Chronic kidney disease, stage 4 (severe): Secondary | ICD-10-CM | POA: Diagnosis not present

## 2017-07-02 DIAGNOSIS — K279 Peptic ulcer, site unspecified, unspecified as acute or chronic, without hemorrhage or perforation: Secondary | ICD-10-CM | POA: Diagnosis not present

## 2017-07-03 DIAGNOSIS — I4891 Unspecified atrial fibrillation: Secondary | ICD-10-CM | POA: Diagnosis not present

## 2017-07-03 DIAGNOSIS — N184 Chronic kidney disease, stage 4 (severe): Secondary | ICD-10-CM | POA: Diagnosis not present

## 2017-07-03 DIAGNOSIS — F0151 Vascular dementia with behavioral disturbance: Secondary | ICD-10-CM | POA: Diagnosis not present

## 2017-07-03 DIAGNOSIS — E46 Unspecified protein-calorie malnutrition: Secondary | ICD-10-CM | POA: Diagnosis not present

## 2017-07-03 DIAGNOSIS — I350 Nonrheumatic aortic (valve) stenosis: Secondary | ICD-10-CM | POA: Diagnosis not present

## 2017-07-03 DIAGNOSIS — I672 Cerebral atherosclerosis: Secondary | ICD-10-CM | POA: Diagnosis not present

## 2017-07-07 DIAGNOSIS — I4891 Unspecified atrial fibrillation: Secondary | ICD-10-CM | POA: Diagnosis not present

## 2017-07-07 DIAGNOSIS — F0151 Vascular dementia with behavioral disturbance: Secondary | ICD-10-CM | POA: Diagnosis not present

## 2017-07-07 DIAGNOSIS — I350 Nonrheumatic aortic (valve) stenosis: Secondary | ICD-10-CM | POA: Diagnosis not present

## 2017-07-07 DIAGNOSIS — N184 Chronic kidney disease, stage 4 (severe): Secondary | ICD-10-CM | POA: Diagnosis not present

## 2017-07-07 DIAGNOSIS — I672 Cerebral atherosclerosis: Secondary | ICD-10-CM | POA: Diagnosis not present

## 2017-07-07 DIAGNOSIS — E46 Unspecified protein-calorie malnutrition: Secondary | ICD-10-CM | POA: Diagnosis not present

## 2017-07-08 DIAGNOSIS — I4891 Unspecified atrial fibrillation: Secondary | ICD-10-CM | POA: Diagnosis not present

## 2017-07-08 DIAGNOSIS — F0151 Vascular dementia with behavioral disturbance: Secondary | ICD-10-CM | POA: Diagnosis not present

## 2017-07-08 DIAGNOSIS — E46 Unspecified protein-calorie malnutrition: Secondary | ICD-10-CM | POA: Diagnosis not present

## 2017-07-08 DIAGNOSIS — I350 Nonrheumatic aortic (valve) stenosis: Secondary | ICD-10-CM | POA: Diagnosis not present

## 2017-07-08 DIAGNOSIS — I672 Cerebral atherosclerosis: Secondary | ICD-10-CM | POA: Diagnosis not present

## 2017-07-08 DIAGNOSIS — N184 Chronic kidney disease, stage 4 (severe): Secondary | ICD-10-CM | POA: Diagnosis not present

## 2017-07-09 DIAGNOSIS — I350 Nonrheumatic aortic (valve) stenosis: Secondary | ICD-10-CM | POA: Diagnosis not present

## 2017-07-09 DIAGNOSIS — E46 Unspecified protein-calorie malnutrition: Secondary | ICD-10-CM | POA: Diagnosis not present

## 2017-07-09 DIAGNOSIS — F0151 Vascular dementia with behavioral disturbance: Secondary | ICD-10-CM | POA: Diagnosis not present

## 2017-07-09 DIAGNOSIS — I87319 Chronic venous hypertension (idiopathic) with ulcer of unspecified lower extremity: Secondary | ICD-10-CM | POA: Diagnosis not present

## 2017-07-09 DIAGNOSIS — N184 Chronic kidney disease, stage 4 (severe): Secondary | ICD-10-CM | POA: Diagnosis not present

## 2017-07-09 DIAGNOSIS — I672 Cerebral atherosclerosis: Secondary | ICD-10-CM | POA: Diagnosis not present

## 2017-07-09 DIAGNOSIS — I4891 Unspecified atrial fibrillation: Secondary | ICD-10-CM | POA: Diagnosis not present

## 2017-07-09 DIAGNOSIS — I482 Chronic atrial fibrillation: Secondary | ICD-10-CM | POA: Diagnosis not present

## 2017-07-10 DIAGNOSIS — F0151 Vascular dementia with behavioral disturbance: Secondary | ICD-10-CM | POA: Diagnosis not present

## 2017-07-10 DIAGNOSIS — E46 Unspecified protein-calorie malnutrition: Secondary | ICD-10-CM | POA: Diagnosis not present

## 2017-07-10 DIAGNOSIS — I4891 Unspecified atrial fibrillation: Secondary | ICD-10-CM | POA: Diagnosis not present

## 2017-07-10 DIAGNOSIS — I672 Cerebral atherosclerosis: Secondary | ICD-10-CM | POA: Diagnosis not present

## 2017-07-10 DIAGNOSIS — N184 Chronic kidney disease, stage 4 (severe): Secondary | ICD-10-CM | POA: Diagnosis not present

## 2017-07-10 DIAGNOSIS — I350 Nonrheumatic aortic (valve) stenosis: Secondary | ICD-10-CM | POA: Diagnosis not present

## 2017-07-11 DIAGNOSIS — I4891 Unspecified atrial fibrillation: Secondary | ICD-10-CM | POA: Diagnosis not present

## 2017-07-11 DIAGNOSIS — I672 Cerebral atherosclerosis: Secondary | ICD-10-CM | POA: Diagnosis not present

## 2017-07-11 DIAGNOSIS — F0151 Vascular dementia with behavioral disturbance: Secondary | ICD-10-CM | POA: Diagnosis not present

## 2017-07-11 DIAGNOSIS — I350 Nonrheumatic aortic (valve) stenosis: Secondary | ICD-10-CM | POA: Diagnosis not present

## 2017-07-11 DIAGNOSIS — N184 Chronic kidney disease, stage 4 (severe): Secondary | ICD-10-CM | POA: Diagnosis not present

## 2017-07-11 DIAGNOSIS — E46 Unspecified protein-calorie malnutrition: Secondary | ICD-10-CM | POA: Diagnosis not present

## 2017-07-14 DIAGNOSIS — F0151 Vascular dementia with behavioral disturbance: Secondary | ICD-10-CM | POA: Diagnosis not present

## 2017-07-14 DIAGNOSIS — I4891 Unspecified atrial fibrillation: Secondary | ICD-10-CM | POA: Diagnosis not present

## 2017-07-14 DIAGNOSIS — E46 Unspecified protein-calorie malnutrition: Secondary | ICD-10-CM | POA: Diagnosis not present

## 2017-07-14 DIAGNOSIS — I672 Cerebral atherosclerosis: Secondary | ICD-10-CM | POA: Diagnosis not present

## 2017-07-14 DIAGNOSIS — I350 Nonrheumatic aortic (valve) stenosis: Secondary | ICD-10-CM | POA: Diagnosis not present

## 2017-07-14 DIAGNOSIS — N184 Chronic kidney disease, stage 4 (severe): Secondary | ICD-10-CM | POA: Diagnosis not present

## 2017-07-15 DIAGNOSIS — F0151 Vascular dementia with behavioral disturbance: Secondary | ICD-10-CM | POA: Diagnosis not present

## 2017-07-15 DIAGNOSIS — I4891 Unspecified atrial fibrillation: Secondary | ICD-10-CM | POA: Diagnosis not present

## 2017-07-15 DIAGNOSIS — N184 Chronic kidney disease, stage 4 (severe): Secondary | ICD-10-CM | POA: Diagnosis not present

## 2017-07-15 DIAGNOSIS — E46 Unspecified protein-calorie malnutrition: Secondary | ICD-10-CM | POA: Diagnosis not present

## 2017-07-15 DIAGNOSIS — I350 Nonrheumatic aortic (valve) stenosis: Secondary | ICD-10-CM | POA: Diagnosis not present

## 2017-07-15 DIAGNOSIS — I672 Cerebral atherosclerosis: Secondary | ICD-10-CM | POA: Diagnosis not present

## 2017-07-16 DIAGNOSIS — I350 Nonrheumatic aortic (valve) stenosis: Secondary | ICD-10-CM | POA: Diagnosis not present

## 2017-07-16 DIAGNOSIS — I4891 Unspecified atrial fibrillation: Secondary | ICD-10-CM | POA: Diagnosis not present

## 2017-07-16 DIAGNOSIS — E46 Unspecified protein-calorie malnutrition: Secondary | ICD-10-CM | POA: Diagnosis not present

## 2017-07-16 DIAGNOSIS — F0151 Vascular dementia with behavioral disturbance: Secondary | ICD-10-CM | POA: Diagnosis not present

## 2017-07-16 DIAGNOSIS — I672 Cerebral atherosclerosis: Secondary | ICD-10-CM | POA: Diagnosis not present

## 2017-07-16 DIAGNOSIS — N184 Chronic kidney disease, stage 4 (severe): Secondary | ICD-10-CM | POA: Diagnosis not present

## 2017-07-17 DIAGNOSIS — N184 Chronic kidney disease, stage 4 (severe): Secondary | ICD-10-CM | POA: Diagnosis not present

## 2017-07-17 DIAGNOSIS — E46 Unspecified protein-calorie malnutrition: Secondary | ICD-10-CM | POA: Diagnosis not present

## 2017-07-17 DIAGNOSIS — F0151 Vascular dementia with behavioral disturbance: Secondary | ICD-10-CM | POA: Diagnosis not present

## 2017-07-17 DIAGNOSIS — I4891 Unspecified atrial fibrillation: Secondary | ICD-10-CM | POA: Diagnosis not present

## 2017-07-17 DIAGNOSIS — I672 Cerebral atherosclerosis: Secondary | ICD-10-CM | POA: Diagnosis not present

## 2017-07-17 DIAGNOSIS — I350 Nonrheumatic aortic (valve) stenosis: Secondary | ICD-10-CM | POA: Diagnosis not present

## 2017-07-18 DIAGNOSIS — N184 Chronic kidney disease, stage 4 (severe): Secondary | ICD-10-CM | POA: Diagnosis not present

## 2017-07-18 DIAGNOSIS — F0151 Vascular dementia with behavioral disturbance: Secondary | ICD-10-CM | POA: Diagnosis not present

## 2017-07-18 DIAGNOSIS — I4891 Unspecified atrial fibrillation: Secondary | ICD-10-CM | POA: Diagnosis not present

## 2017-07-18 DIAGNOSIS — I350 Nonrheumatic aortic (valve) stenosis: Secondary | ICD-10-CM | POA: Diagnosis not present

## 2017-07-18 DIAGNOSIS — E46 Unspecified protein-calorie malnutrition: Secondary | ICD-10-CM | POA: Diagnosis not present

## 2017-07-18 DIAGNOSIS — I672 Cerebral atherosclerosis: Secondary | ICD-10-CM | POA: Diagnosis not present

## 2017-07-21 DIAGNOSIS — F0151 Vascular dementia with behavioral disturbance: Secondary | ICD-10-CM | POA: Diagnosis not present

## 2017-07-21 DIAGNOSIS — I350 Nonrheumatic aortic (valve) stenosis: Secondary | ICD-10-CM | POA: Diagnosis not present

## 2017-07-21 DIAGNOSIS — I4891 Unspecified atrial fibrillation: Secondary | ICD-10-CM | POA: Diagnosis not present

## 2017-07-21 DIAGNOSIS — N184 Chronic kidney disease, stage 4 (severe): Secondary | ICD-10-CM | POA: Diagnosis not present

## 2017-07-21 DIAGNOSIS — E46 Unspecified protein-calorie malnutrition: Secondary | ICD-10-CM | POA: Diagnosis not present

## 2017-07-21 DIAGNOSIS — I672 Cerebral atherosclerosis: Secondary | ICD-10-CM | POA: Diagnosis not present

## 2017-07-22 DIAGNOSIS — I672 Cerebral atherosclerosis: Secondary | ICD-10-CM | POA: Diagnosis not present

## 2017-07-22 DIAGNOSIS — E46 Unspecified protein-calorie malnutrition: Secondary | ICD-10-CM | POA: Diagnosis not present

## 2017-07-22 DIAGNOSIS — I350 Nonrheumatic aortic (valve) stenosis: Secondary | ICD-10-CM | POA: Diagnosis not present

## 2017-07-22 DIAGNOSIS — N184 Chronic kidney disease, stage 4 (severe): Secondary | ICD-10-CM | POA: Diagnosis not present

## 2017-07-22 DIAGNOSIS — I4891 Unspecified atrial fibrillation: Secondary | ICD-10-CM | POA: Diagnosis not present

## 2017-07-22 DIAGNOSIS — F0151 Vascular dementia with behavioral disturbance: Secondary | ICD-10-CM | POA: Diagnosis not present

## 2017-07-23 DIAGNOSIS — E46 Unspecified protein-calorie malnutrition: Secondary | ICD-10-CM | POA: Diagnosis not present

## 2017-07-23 DIAGNOSIS — N184 Chronic kidney disease, stage 4 (severe): Secondary | ICD-10-CM | POA: Diagnosis not present

## 2017-07-23 DIAGNOSIS — I350 Nonrheumatic aortic (valve) stenosis: Secondary | ICD-10-CM | POA: Diagnosis not present

## 2017-07-23 DIAGNOSIS — F0151 Vascular dementia with behavioral disturbance: Secondary | ICD-10-CM | POA: Diagnosis not present

## 2017-07-23 DIAGNOSIS — I672 Cerebral atherosclerosis: Secondary | ICD-10-CM | POA: Diagnosis not present

## 2017-07-23 DIAGNOSIS — I4891 Unspecified atrial fibrillation: Secondary | ICD-10-CM | POA: Diagnosis not present

## 2017-07-24 DIAGNOSIS — F0151 Vascular dementia with behavioral disturbance: Secondary | ICD-10-CM | POA: Diagnosis not present

## 2017-07-24 DIAGNOSIS — I350 Nonrheumatic aortic (valve) stenosis: Secondary | ICD-10-CM | POA: Diagnosis not present

## 2017-07-24 DIAGNOSIS — E46 Unspecified protein-calorie malnutrition: Secondary | ICD-10-CM | POA: Diagnosis not present

## 2017-07-24 DIAGNOSIS — I672 Cerebral atherosclerosis: Secondary | ICD-10-CM | POA: Diagnosis not present

## 2017-07-24 DIAGNOSIS — I4891 Unspecified atrial fibrillation: Secondary | ICD-10-CM | POA: Diagnosis not present

## 2017-07-24 DIAGNOSIS — N184 Chronic kidney disease, stage 4 (severe): Secondary | ICD-10-CM | POA: Diagnosis not present

## 2017-07-25 DIAGNOSIS — N184 Chronic kidney disease, stage 4 (severe): Secondary | ICD-10-CM | POA: Diagnosis not present

## 2017-07-25 DIAGNOSIS — I672 Cerebral atherosclerosis: Secondary | ICD-10-CM | POA: Diagnosis not present

## 2017-07-25 DIAGNOSIS — E46 Unspecified protein-calorie malnutrition: Secondary | ICD-10-CM | POA: Diagnosis not present

## 2017-07-25 DIAGNOSIS — I4891 Unspecified atrial fibrillation: Secondary | ICD-10-CM | POA: Diagnosis not present

## 2017-07-25 DIAGNOSIS — I350 Nonrheumatic aortic (valve) stenosis: Secondary | ICD-10-CM | POA: Diagnosis not present

## 2017-07-25 DIAGNOSIS — F0151 Vascular dementia with behavioral disturbance: Secondary | ICD-10-CM | POA: Diagnosis not present

## 2017-07-29 DIAGNOSIS — I4891 Unspecified atrial fibrillation: Secondary | ICD-10-CM | POA: Diagnosis not present

## 2017-07-29 DIAGNOSIS — I350 Nonrheumatic aortic (valve) stenosis: Secondary | ICD-10-CM | POA: Diagnosis not present

## 2017-07-29 DIAGNOSIS — F0151 Vascular dementia with behavioral disturbance: Secondary | ICD-10-CM | POA: Diagnosis not present

## 2017-07-29 DIAGNOSIS — E46 Unspecified protein-calorie malnutrition: Secondary | ICD-10-CM | POA: Diagnosis not present

## 2017-07-29 DIAGNOSIS — I672 Cerebral atherosclerosis: Secondary | ICD-10-CM | POA: Diagnosis not present

## 2017-07-29 DIAGNOSIS — N184 Chronic kidney disease, stage 4 (severe): Secondary | ICD-10-CM | POA: Diagnosis not present

## 2017-07-31 DIAGNOSIS — I4891 Unspecified atrial fibrillation: Secondary | ICD-10-CM | POA: Diagnosis not present

## 2017-07-31 DIAGNOSIS — N184 Chronic kidney disease, stage 4 (severe): Secondary | ICD-10-CM | POA: Diagnosis not present

## 2017-07-31 DIAGNOSIS — E46 Unspecified protein-calorie malnutrition: Secondary | ICD-10-CM | POA: Diagnosis not present

## 2017-07-31 DIAGNOSIS — I350 Nonrheumatic aortic (valve) stenosis: Secondary | ICD-10-CM | POA: Diagnosis not present

## 2017-07-31 DIAGNOSIS — F0151 Vascular dementia with behavioral disturbance: Secondary | ICD-10-CM | POA: Diagnosis not present

## 2017-07-31 DIAGNOSIS — I672 Cerebral atherosclerosis: Secondary | ICD-10-CM | POA: Diagnosis not present

## 2017-08-01 DIAGNOSIS — N184 Chronic kidney disease, stage 4 (severe): Secondary | ICD-10-CM | POA: Diagnosis not present

## 2017-08-01 DIAGNOSIS — I672 Cerebral atherosclerosis: Secondary | ICD-10-CM | POA: Diagnosis not present

## 2017-08-01 DIAGNOSIS — E46 Unspecified protein-calorie malnutrition: Secondary | ICD-10-CM | POA: Diagnosis not present

## 2017-08-01 DIAGNOSIS — F0151 Vascular dementia with behavioral disturbance: Secondary | ICD-10-CM | POA: Diagnosis not present

## 2017-08-01 DIAGNOSIS — I4891 Unspecified atrial fibrillation: Secondary | ICD-10-CM | POA: Diagnosis not present

## 2017-08-01 DIAGNOSIS — I350 Nonrheumatic aortic (valve) stenosis: Secondary | ICD-10-CM | POA: Diagnosis not present

## 2017-08-02 DIAGNOSIS — K279 Peptic ulcer, site unspecified, unspecified as acute or chronic, without hemorrhage or perforation: Secondary | ICD-10-CM | POA: Diagnosis not present

## 2017-08-02 DIAGNOSIS — R54 Age-related physical debility: Secondary | ICD-10-CM | POA: Diagnosis not present

## 2017-08-02 DIAGNOSIS — F0151 Vascular dementia with behavioral disturbance: Secondary | ICD-10-CM | POA: Diagnosis not present

## 2017-08-02 DIAGNOSIS — E46 Unspecified protein-calorie malnutrition: Secondary | ICD-10-CM | POA: Diagnosis not present

## 2017-08-02 DIAGNOSIS — I350 Nonrheumatic aortic (valve) stenosis: Secondary | ICD-10-CM | POA: Diagnosis not present

## 2017-08-02 DIAGNOSIS — N401 Enlarged prostate with lower urinary tract symptoms: Secondary | ICD-10-CM | POA: Diagnosis not present

## 2017-08-02 DIAGNOSIS — N184 Chronic kidney disease, stage 4 (severe): Secondary | ICD-10-CM | POA: Diagnosis not present

## 2017-08-02 DIAGNOSIS — I672 Cerebral atherosclerosis: Secondary | ICD-10-CM | POA: Diagnosis not present

## 2017-08-02 DIAGNOSIS — I4891 Unspecified atrial fibrillation: Secondary | ICD-10-CM | POA: Diagnosis not present

## 2017-08-02 DIAGNOSIS — E039 Hypothyroidism, unspecified: Secondary | ICD-10-CM | POA: Diagnosis not present

## 2017-08-03 DIAGNOSIS — N184 Chronic kidney disease, stage 4 (severe): Secondary | ICD-10-CM | POA: Diagnosis not present

## 2017-08-03 DIAGNOSIS — I672 Cerebral atherosclerosis: Secondary | ICD-10-CM | POA: Diagnosis not present

## 2017-08-03 DIAGNOSIS — I350 Nonrheumatic aortic (valve) stenosis: Secondary | ICD-10-CM | POA: Diagnosis not present

## 2017-08-03 DIAGNOSIS — F0151 Vascular dementia with behavioral disturbance: Secondary | ICD-10-CM | POA: Diagnosis not present

## 2017-08-03 DIAGNOSIS — I4891 Unspecified atrial fibrillation: Secondary | ICD-10-CM | POA: Diagnosis not present

## 2017-08-03 DIAGNOSIS — E46 Unspecified protein-calorie malnutrition: Secondary | ICD-10-CM | POA: Diagnosis not present

## 2017-09-01 DEATH — deceased

## 2018-11-30 IMAGING — MR MR HIP*R* W/O CM
4 of 5 series · 18 of 40 positions shown · non-contrast
Comparison: CT from 03/18/2017

CLINICAL DATA: Unwitnessed fall, right hip pain.

EXAM:
MR OF THE RIGHT HIP WITHOUT CONTRAST
TECHNIQUE: Multiplanar, multisequence MR imaging was performed. No intravenous
contrast was administered.

[Series 5: T1 · coronal · 4.0mm · 0.70mm/px · 3 of 21 slices shown (1 of 2)]
[im 4/21]
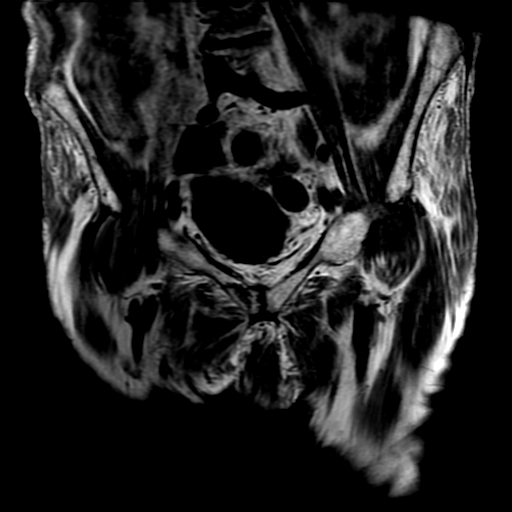
[im 11/21]
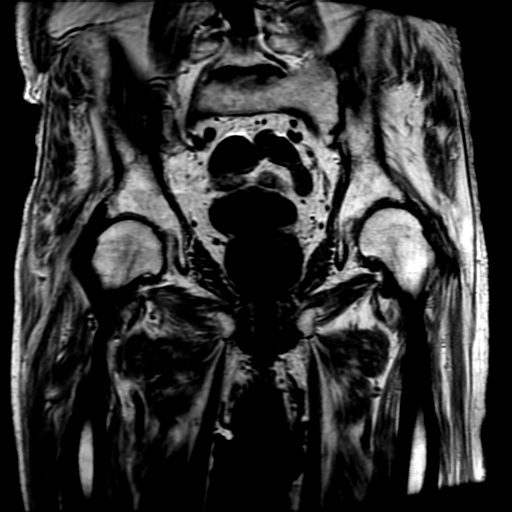
[im 17/21]
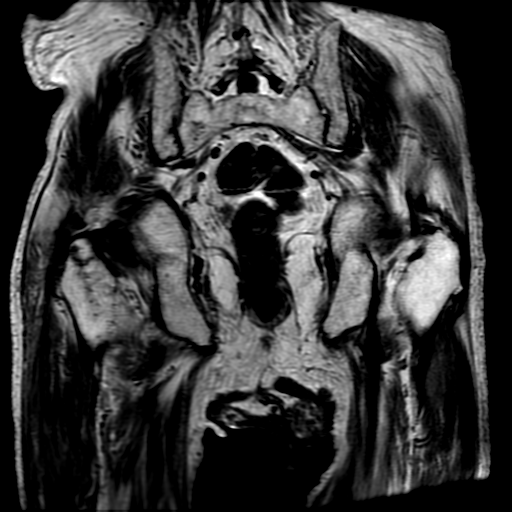

[Series 6: T2 fat-sat · axial · 4.0mm · 0.70mm/px · z∈[-49,+46]mm · 3 of 27 slices shown]
[im 4/27]
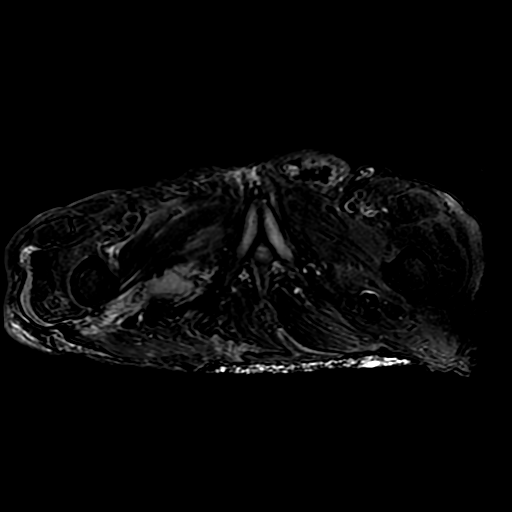
[im 14/27]
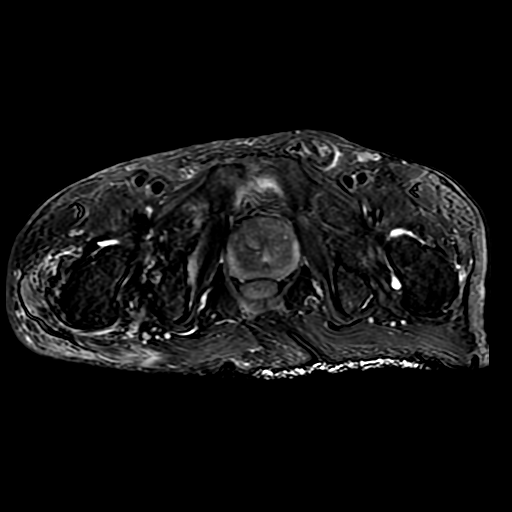
[im 23/27]
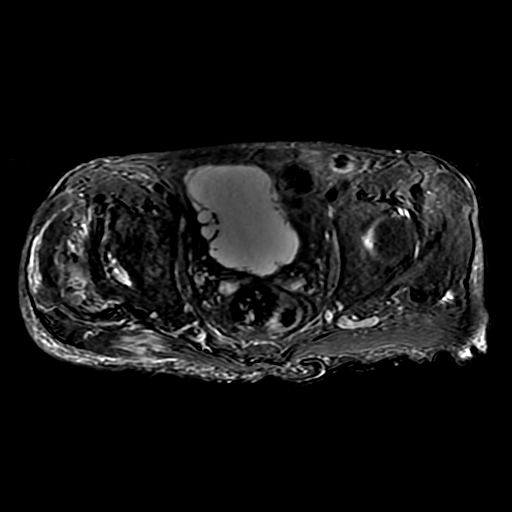

[Series 8: T1 · axial · 4.0mm · 0.70mm/px · z∈[-54,+46]mm · 3 of 28 slices shown (2 of 2)]
[im 4/28]
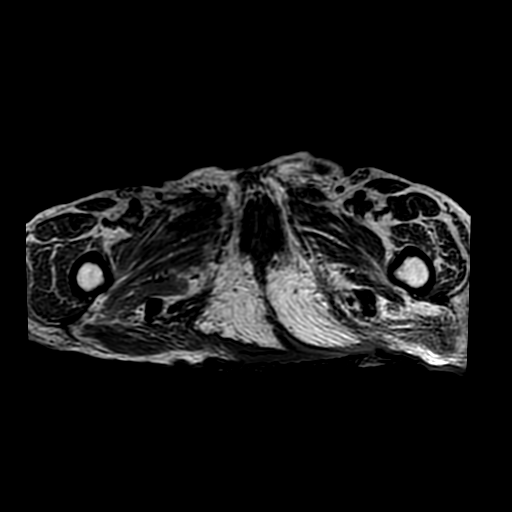
[im 14/28]
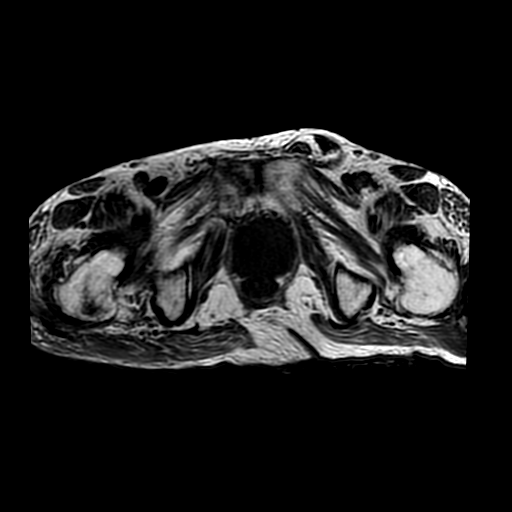
[im 24/28]
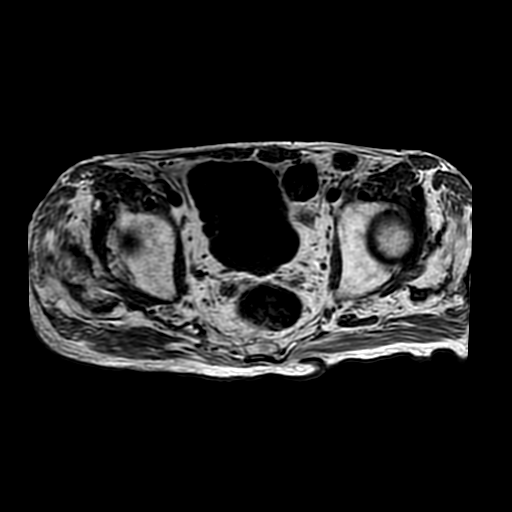

[Series 9: PD fat-sat · sagittal · 4.0mm · 0.35mm/px · 9 of 29 slices shown]
[im 1/29]
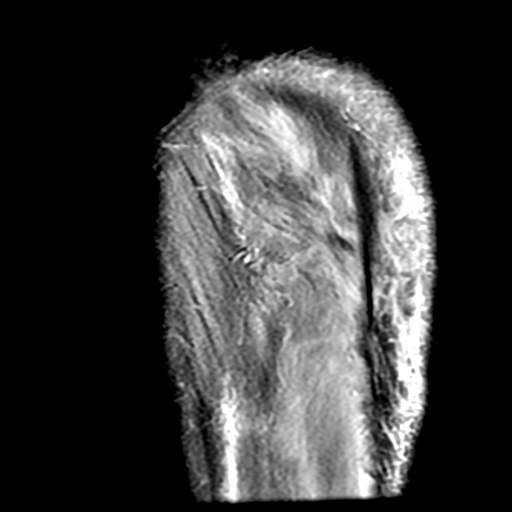
[im 4/29]
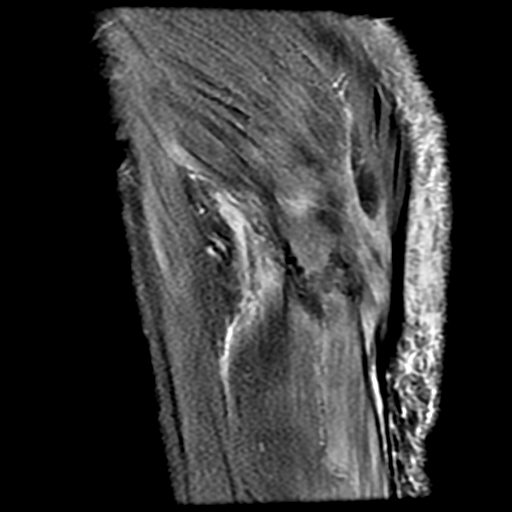
[im 8/29]
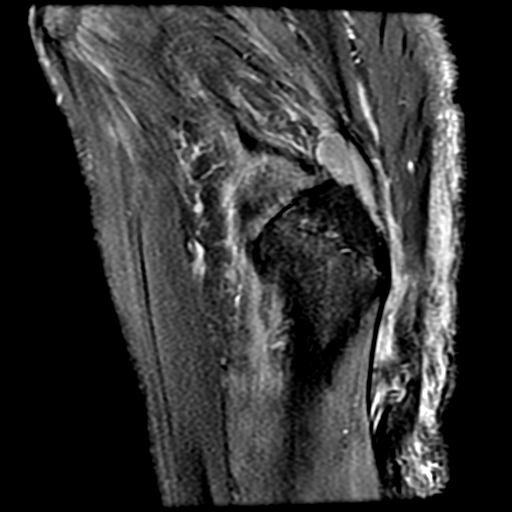
[im 11/29]
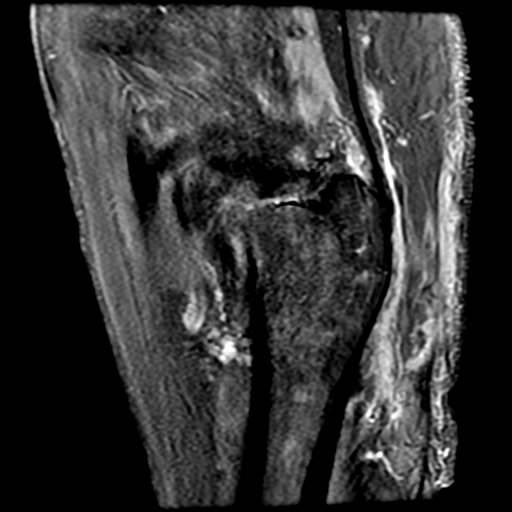
[im 15/29]
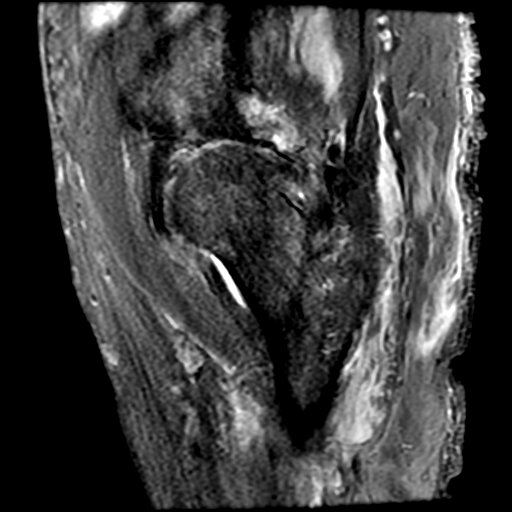
[im 18/29]
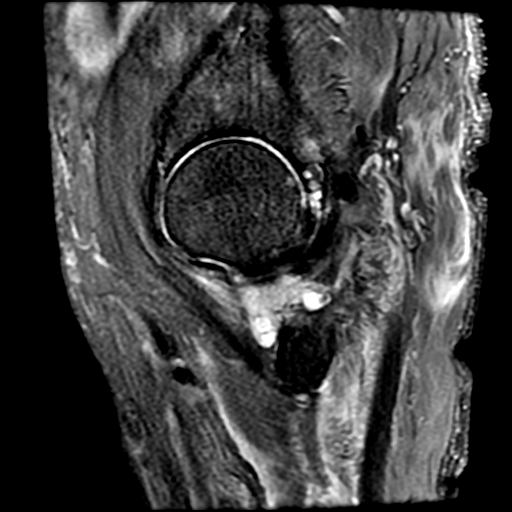
[im 22/29]
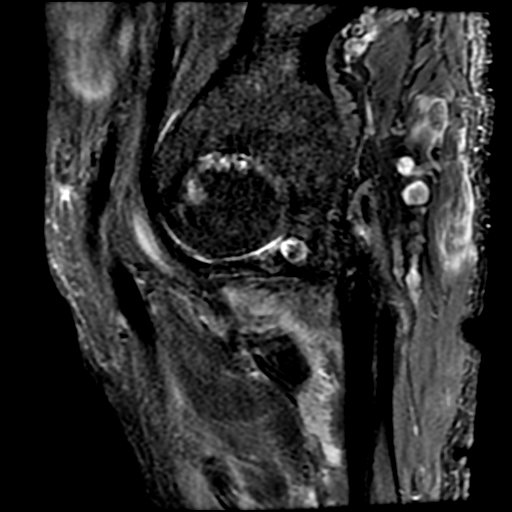
[im 25/29]
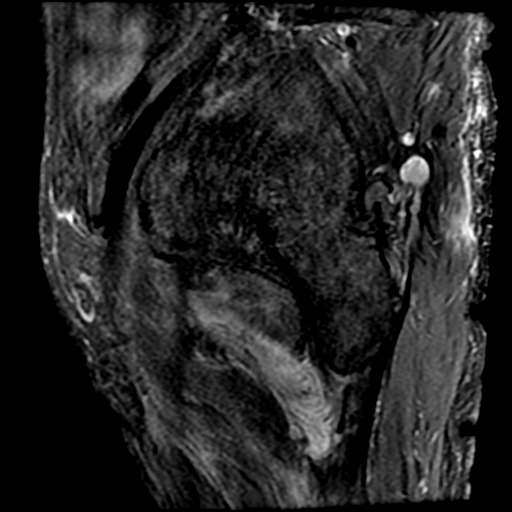
[im 29/29]
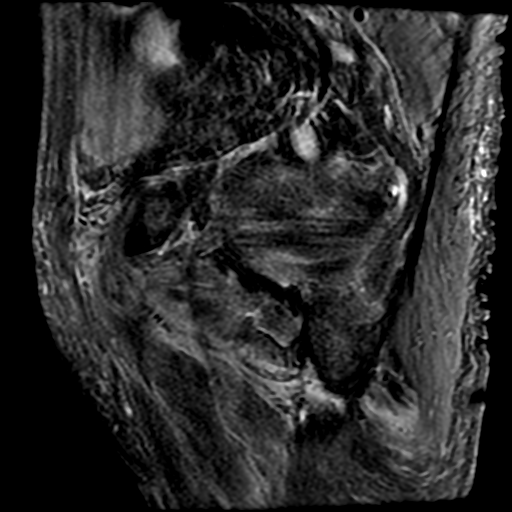

[18 of 40 positions shown; findings below may reference images not displayed]

FINDINGS: Bones: Nondisplaced fracture the right superior pubic ramus
extending into the pubic body and adjacent junction of the pubic
body and right inferior pubic ramus.

Low-level abnormal edema extending vertically in the right sacral
ala noted on the coronal images, favoring fracture. Today's exam was
focused on the right hip rather than the sacrum.

Right-sided intertrochanteric fracture is present and nondisplaced.
There is a transverse component in the greater trochanter, with the
portion extending towards the lesser trochanter much less
conspicuous but still thought to be present as on images 3 through 8
of series 5 and for example on image 17 of series 8.

Articular cartilage and labrum

Articular cartilage:  Severe degenerative chondral thinning

Labrum: Abnormal linear increased signal along the deep margin of
the right anterior superior labrum on images 16-19 of series 9
compatible with labral tear, likely chronic.

Joint or bursal effusion

Joint effusion:  Trace bilateral hip joint effusions.

Bursae: No distinct bursitis, although fluid signal does track deep
to the right iliotibial band.

Muscles and tendons

Muscles and tendons: Considerable edema around the right hip
including the gluteus minimus and medius muscles, the right hip
adductor musculature, and vastus lateralis muscle. Adjacent
subcutaneous edema is also present.

Other findings

Miscellaneous: Indirect left inguinal hernia contains a loop of
bowel. Enlarged prostate gland measures 5.4 by 4.9 by 5.4 cm (volume
= 75 cm^3).

Lower lumbar spondylosis and degenerative disc disease.
IMPRESSION: 1. Nondisplaced right intertrochanteric fracture.
2. Nondisplaced fractures through the right superior pubic ramus,
pubic body, and adjacent inferior pubic ramus.
3. Nondisplaced fracture of the right sacral ala.
4. Extensive muscular edema in the right gluteal musculature, right
hip adductor musculature, and right vastus lateralis with fluid
signal tracking along the iliotibial band on the right.
5. Severe degenerative right hip arthropathy.
6. Chronic appearing right labral tear.
7. Trace bilateral hip joint effusions.
8. Indirect left inguinal hernia contains a loop of small bowel
without findings of strangulation/obstruction.
9. Enlarged prostate gland.
10. Lower lumbar spondylosis and degenerative disc disease.
